# Patient Record
Sex: Female | Born: 1970
Health system: Southern US, Community
[De-identification: ages and names within clinical notes are randomized; demographics above are authoritative.]

## PROBLEM LIST (undated history)

## (undated) DIAGNOSIS — F988 Other specified behavioral and emotional disorders with onset usually occurring in childhood and adolescence: Secondary | ICD-10-CM

## (undated) DIAGNOSIS — E049 Nontoxic goiter, unspecified: Secondary | ICD-10-CM

## (undated) DIAGNOSIS — K649 Unspecified hemorrhoids: Secondary | ICD-10-CM

## (undated) DIAGNOSIS — R12 Heartburn: Secondary | ICD-10-CM

## (undated) DIAGNOSIS — M549 Dorsalgia, unspecified: Secondary | ICD-10-CM

## (undated) DIAGNOSIS — N920 Excessive and frequent menstruation with regular cycle: Secondary | ICD-10-CM

## (undated) DIAGNOSIS — F419 Anxiety disorder, unspecified: Secondary | ICD-10-CM

## (undated) DIAGNOSIS — R5383 Other fatigue: Secondary | ICD-10-CM

## (undated) DIAGNOSIS — N926 Irregular menstruation, unspecified: Secondary | ICD-10-CM

## (undated) DIAGNOSIS — M722 Plantar fascial fibromatosis: Secondary | ICD-10-CM

## (undated) DIAGNOSIS — K219 Gastro-esophageal reflux disease without esophagitis: Secondary | ICD-10-CM

## (undated) DIAGNOSIS — R7303 Prediabetes: Secondary | ICD-10-CM

## (undated) DIAGNOSIS — N644 Mastodynia: Secondary | ICD-10-CM

## (undated) DIAGNOSIS — R030 Elevated blood-pressure reading, without diagnosis of hypertension: Secondary | ICD-10-CM

## (undated) DIAGNOSIS — J309 Allergic rhinitis, unspecified: Secondary | ICD-10-CM

## (undated) DIAGNOSIS — I10 Essential (primary) hypertension: Secondary | ICD-10-CM

## (undated) DIAGNOSIS — R102 Pelvic and perineal pain: Secondary | ICD-10-CM

## (undated) DIAGNOSIS — N6089 Other benign mammary dysplasias of unspecified breast: Secondary | ICD-10-CM

## (undated) DIAGNOSIS — K829 Disease of gallbladder, unspecified: Secondary | ICD-10-CM

## (undated) DIAGNOSIS — L918 Other hypertrophic disorders of the skin: Secondary | ICD-10-CM

## (undated) DIAGNOSIS — D219 Benign neoplasm of connective and other soft tissue, unspecified: Secondary | ICD-10-CM

## (undated) DIAGNOSIS — J329 Chronic sinusitis, unspecified: Secondary | ICD-10-CM

## (undated) DIAGNOSIS — M778 Other enthesopathies, not elsewhere classified: Secondary | ICD-10-CM

## (undated) DIAGNOSIS — N816 Rectocele: Secondary | ICD-10-CM

## (undated) DIAGNOSIS — M2550 Pain in unspecified joint: Secondary | ICD-10-CM

## (undated) DIAGNOSIS — E669 Obesity, unspecified: Secondary | ICD-10-CM

## (undated) DIAGNOSIS — H6690 Otitis media, unspecified, unspecified ear: Secondary | ICD-10-CM

## (undated) DIAGNOSIS — R51 Headache: Secondary | ICD-10-CM

## (undated) HISTORY — DX: Other benign mammary dysplasias of unspecified breast: N60.89

## (undated) HISTORY — DX: Other hypertrophic disorders of the skin: L91.8

## (undated) HISTORY — DX: Nontoxic goiter, unspecified: E04.9

## (undated) HISTORY — DX: Excessive and frequent menstruation with regular cycle: N92.0

## (undated) HISTORY — DX: Obesity, unspecified: E66.9

## (undated) HISTORY — DX: Other specified behavioral and emotional disorders with onset usually occurring in childhood and adolescence: F98.8

## (undated) HISTORY — DX: Gastro-esophageal reflux disease without esophagitis: K21.9

## (undated) HISTORY — PX: GUM SURGERY: SHX658

## (undated) HISTORY — DX: Dorsalgia, unspecified: M54.9

## (undated) HISTORY — DX: Unspecified hemorrhoids: K64.9

## (undated) HISTORY — DX: Elevated blood-pressure reading, without diagnosis of hypertension: R03.0

## (undated) HISTORY — DX: Pain in unspecified joint: M25.50

## (undated) HISTORY — DX: Mastodynia: N64.4

## (undated) HISTORY — DX: Pelvic and perineal pain: R10.2

## (undated) HISTORY — DX: Benign neoplasm of connective and other soft tissue, unspecified: D21.9

## (undated) HISTORY — DX: Disease of gallbladder, unspecified: K82.9

## (undated) HISTORY — DX: Other fatigue: R53.83

## (undated) HISTORY — DX: Chronic sinusitis, unspecified: J32.9

## (undated) HISTORY — DX: Rectocele: N81.6

## (undated) HISTORY — DX: Headache: R51

## (undated) HISTORY — DX: Heartburn: R12

## (undated) HISTORY — DX: Essential (primary) hypertension: I10

## (undated) HISTORY — DX: Otitis media, unspecified, unspecified ear: H66.90

## (undated) HISTORY — PX: VARICOSE VEIN SURGERY: SHX832

## (undated) HISTORY — DX: Allergic rhinitis, unspecified: J30.9

## (undated) HISTORY — DX: Irregular menstruation, unspecified: N92.6

## (undated) HISTORY — DX: Plantar fascial fibromatosis: M72.2

## (undated) HISTORY — DX: Other enthesopathies, not elsewhere classified: M77.8

## (undated) HISTORY — DX: Anxiety disorder, unspecified: F41.9

## (undated) HISTORY — DX: Prediabetes: R73.03

## (undated) HISTORY — PX: OTHER SURGICAL HISTORY: SHX169

---

## 2001-10-23 ENCOUNTER — Ambulatory Visit (HOSPITAL_COMMUNITY): Admission: AD | Admit: 2001-10-23 | Discharge: 2001-10-23 | Payer: Self-pay | Admitting: Obstetrics and Gynecology

## 2001-11-27 ENCOUNTER — Inpatient Hospital Stay (HOSPITAL_COMMUNITY): Admission: RE | Admit: 2001-11-27 | Discharge: 2001-11-29 | Payer: Self-pay | Admitting: Obstetrics and Gynecology

## 2002-04-24 ENCOUNTER — Encounter (INDEPENDENT_AMBULATORY_CARE_PROVIDER_SITE_OTHER): Payer: Self-pay | Admitting: Specialist

## 2002-04-24 ENCOUNTER — Ambulatory Visit (HOSPITAL_COMMUNITY): Admission: RE | Admit: 2002-04-24 | Discharge: 2002-04-24 | Payer: Self-pay | Admitting: Obstetrics & Gynecology

## 2002-09-12 ENCOUNTER — Ambulatory Visit (HOSPITAL_COMMUNITY): Admission: RE | Admit: 2002-09-12 | Discharge: 2002-09-12 | Payer: Self-pay | Admitting: Obstetrics and Gynecology

## 2002-09-12 ENCOUNTER — Encounter: Payer: Self-pay | Admitting: Obstetrics and Gynecology

## 2003-06-06 ENCOUNTER — Ambulatory Visit (HOSPITAL_COMMUNITY): Admission: RE | Admit: 2003-06-06 | Discharge: 2003-06-06 | Payer: Self-pay | Admitting: Internal Medicine

## 2003-08-01 ENCOUNTER — Ambulatory Visit (HOSPITAL_COMMUNITY): Admission: RE | Admit: 2003-08-01 | Discharge: 2003-08-01 | Payer: Self-pay | Admitting: Family Medicine

## 2003-08-01 ENCOUNTER — Emergency Department (HOSPITAL_COMMUNITY): Admission: EM | Admit: 2003-08-01 | Discharge: 2003-08-01 | Payer: Self-pay | Admitting: Emergency Medicine

## 2004-05-21 ENCOUNTER — Ambulatory Visit (HOSPITAL_COMMUNITY): Admission: RE | Admit: 2004-05-21 | Discharge: 2004-05-21 | Payer: Self-pay | Admitting: Family Medicine

## 2004-11-06 ENCOUNTER — Encounter: Admission: RE | Admit: 2004-11-06 | Discharge: 2004-11-06 | Payer: Self-pay | Admitting: Obstetrics and Gynecology

## 2007-11-07 ENCOUNTER — Other Ambulatory Visit: Admission: RE | Admit: 2007-11-07 | Discharge: 2007-11-07 | Payer: Self-pay | Admitting: Obstetrics & Gynecology

## 2008-05-30 ENCOUNTER — Inpatient Hospital Stay (HOSPITAL_COMMUNITY): Admission: AD | Admit: 2008-05-30 | Discharge: 2008-06-01 | Payer: Self-pay | Admitting: Obstetrics and Gynecology

## 2008-11-04 ENCOUNTER — Other Ambulatory Visit: Admission: RE | Admit: 2008-11-04 | Discharge: 2008-11-04 | Payer: Self-pay | Admitting: Obstetrics and Gynecology

## 2008-12-02 ENCOUNTER — Encounter (HOSPITAL_COMMUNITY): Admission: RE | Admit: 2008-12-02 | Discharge: 2008-12-17 | Payer: Self-pay | Admitting: Unknown Physician Specialty

## 2009-11-20 ENCOUNTER — Other Ambulatory Visit: Admission: RE | Admit: 2009-11-20 | Discharge: 2009-11-20 | Payer: Self-pay | Admitting: Obstetrics and Gynecology

## 2009-12-29 ENCOUNTER — Ambulatory Visit (HOSPITAL_COMMUNITY): Admission: RE | Admit: 2009-12-29 | Discharge: 2009-12-29 | Payer: Self-pay | Admitting: Family Medicine

## 2010-09-14 ENCOUNTER — Emergency Department (HOSPITAL_COMMUNITY)
Admission: EM | Admit: 2010-09-14 | Discharge: 2010-09-15 | Payer: Self-pay | Source: Home / Self Care | Admitting: Emergency Medicine

## 2010-09-20 ENCOUNTER — Encounter: Payer: Self-pay | Admitting: Family Medicine

## 2010-09-24 ENCOUNTER — Ambulatory Visit (HOSPITAL_COMMUNITY)
Admission: RE | Admit: 2010-09-24 | Discharge: 2010-09-24 | Payer: Self-pay | Source: Home / Self Care | Attending: Family Medicine | Admitting: Family Medicine

## 2010-12-01 ENCOUNTER — Other Ambulatory Visit: Payer: Self-pay | Admitting: Obstetrics & Gynecology

## 2010-12-01 ENCOUNTER — Other Ambulatory Visit (HOSPITAL_COMMUNITY)
Admission: RE | Admit: 2010-12-01 | Discharge: 2010-12-01 | Disposition: A | Discharge status: Home / Self Care | Payer: BC Managed Care – PPO | Source: Ambulatory Visit | Attending: Obstetrics & Gynecology | Admitting: Obstetrics & Gynecology

## 2010-12-01 DIAGNOSIS — Z01419 Encounter for gynecological examination (general) (routine) without abnormal findings: Secondary | ICD-10-CM | POA: Insufficient documentation

## 2011-01-12 NOTE — Discharge Summary (Signed)
Kelsey Cooke, Kelsey Cooke             ACCOUNT NO.:  [id number]   MEDICAL RECORD NO.:  [id number]          PATIENT TYPE:  INP   LOCATION:  9110                          FACILITY:  [facility]   PHYSICIAN:  Lesly Dukes, M.D. DATE OF BIRTH:  Jul 23, 1971   DATE OF ADMISSION:  05/30/2008  DATE OF DISCHARGE:  06/01/2008                               DISCHARGE SUMMARY   DIAGNOSIS AT ADMISSION:  Onset of labor.   DIAGNOSIS AT DISCHARGE:  Status post normal spontaneous vaginal delivery  at term.   PROCEDURES DONE:  Nonstress tests.   BRIEF HOSPITAL COURSE:  This is a 40 year old G3, P3-0-0-3, female at 79  and 3 weeks who went into labor, had assisted rupture of membranes, and  delivered by normal spontaneous vaginal delivery with epidural.  There  was a 3-vessel cord and spontaneous placenta.  Estimated blood loss was  350 mL.  There was a second-degree laceration repaired with 2-0 Vicryl.  Apgars of the baby were 8 at one minute and 9 at five minutes.  It was a  female infant.  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  Contraception will be with the father getting a vasectomy.  The patient was group B strep negative.  Blood type A negative, rubella  immune, RPR nonreactive, and HIV negative.  Latest hemoglobin was 10.1.   DISCHARGE CONDITION:  Stable.   DISCHARGE DISPOSITION:  Home.   ACTIVITIES:  Unrestricted.   DIET:  Routine.   INSTRUCTIONS:  The patient is instructed to call Dr. Rayna Sexton office  for a followup appointment in 6 weeks.  She is also instructed to call  the office on Monday to set up a time to take her son in for  circumcision.      Rodney Langton, MD      Lesly Dukes, M.D.  Electronically Signed    TT/MEDQ  D:  06/01/2008  T:  06/01/2008  Job:  [id number]

## 2011-01-12 NOTE — Op Note (Signed)
NAMETYMIA, STREB             ACCOUNT NO.:  [id number]   MEDICAL RECORD NO.:  [id number]          PATIENT TYPE:  INP   LOCATION:  9110                          FACILITY:  [facility]   PHYSICIAN:  Tilda Burrow, M.D. DATE OF BIRTH:  [birth year]   DATE OF PROCEDURE:  DATE OF DISCHARGE:                               OPERATIVE REPORT   Delivery time 6:33 a.m. on May 30, 2008.   Kelsey Cooke was admitted shortly after 2:00 a.m. after presenting with  early labor, cervix was 3 cm on initial assessment progressing promptly  to 6 cm at 4 a.m.  Amniotomy was performed at 4:50 a.m. and low-dose  oxytocin was initiated.  She progressed, completely dilating within 2  hours and delivered from occiput anterior position a healthy female  infant.  Weight 7 pounds 4 ounces, delivered over a second degree  laceration in the left occiput anterior position with slight shoulder  dystocia, responding nicely to rotation counterclockwise and releasing  the anterior shoulder.  The cord was clamped, and baby placed on the  maternal abdomen.  Amniotic fluid was clear without malodor.  Placenta  delivered intact, Schultz presentation, cord blood samples obtained, but  no blood gas is considered necessary.  The patient recovered nicely the  second degree laceration was repaired easily using continuous running 0  Vicryl.  The patient is group B strep negative, will receive  circumcision inhouse, and husband plans vasectomy.      Tilda Burrow, M.D.  Electronically Signed     JVF/MEDQ  D:  05/30/2008  T:  05/31/2008  Job:  [id number]   cc:   Donna Bernard, M.D.  Fax: [phone]

## 2011-01-15 NOTE — Op Note (Signed)
[facility]  Patient:    Kelsey Cooke, Kelsey Cooke Visit Number: [id number] MRN: [id number]          Service Type: OBS Location: 4A A415 01 Attending Physician:  Tilda Burrow Dictated by:   Zerita Boers, N.M. Proc. Date: 11/27/01 Admit Date:  11/27/2001   CC:         [facility]   Operative Report  DELIVERY SUMMARY  Date of delivery:  November 27, 2001, at 10:27 p.m. Length of first stage labor:   14 hours 22 minutes. Length of second stage labor:  12 minutes. Length of third stage labor:   18 minutes.  DELIVERY NOTE:  Daaiyah had a spontaneous vaginal delivery of a viable female infant weighing 6 pounds 11 ounces, with Apgars of 9 and 9, with a second degree perineal laceration upon inspection.  Upon delivery of head, shoulders were delivered without any difficulty.  Upon delivery of infant, vigorous suctioning.  Infant had good tone, strong cry, and pinked up well.  Cord was clamped and infant laid upon mothers abdomen.  Second stage of labor was actively managed with 20 units Pitocin in 1000 cc of D5LR at a rapid rate. Placenta was delivered spontaneously via Schultze mechanism.  Three-vessel cord was noted upon inspection.  Fundus was firm _____ with massage, and estimated blood loss was approximately 350 cc.  Second degree perineal laceration was repaired using 20 cc of 1% lidocaine as local infiltrate, repair with 2-0 Vicryl, three interrupted sutures, and then subcuticular closed.  Good hemostasis was obtained, and infant and mother were transferred out to the postpartum unit in stable condition. Dictated by:   Zerita Boers, N.M. Attending Physician:  Tilda Burrow DD:  11/27/01 TD:  11/28/01 Job: [id number]

## 2011-01-15 NOTE — H&P (Signed)
NAME:  Kelsey Cooke, Kelsey Cooke NO.:  [id number]   MEDICAL RECORD NO.:  [id number]                  PATIENT TYPE:   LOCATION:                                       FACILITY:   PHYSICIAN:  R. Roetta Sessions, M.D.              DATE OF BIRTH:  December 19, 1970   DATE OF ADMISSION:  DATE OF DISCHARGE:                                HISTORY & PHYSICAL   REFERRING PHYSICIAN:  Dr. Bonne Dolores, family nurse  practitioner.   REASON FOR REFERRAL:  Left-sided abdominal pain and reflux disease.   HISTORY OF PRESENT ILLNESS:  [name] is a pleasant 40 year old  lady kindly sent over at the courtesy of Cyril Mourning, F.N.P. and Dr.  Turner Daniels to further evaluate crescendo reflux symptoms.  She describes  heartburn almost every night for the last few months.  She has gained 8  pounds in the past few months.  She was started on Nexium one month ago but  cannot tell much difference in her symptoms.  No odynophagia, no dysphagia;  no early satiety, nausea, or vomiting.  For the past eight years she has had  intermittent left lower quadrant abdominal pain.  Bowel function has been  altered somewhat.  She has anywhere from one bowel movement daily to one  every couple of days.  She has not seen any melena or rectal bleeding.  She  never has diarrhea.  She has not had any diagnostic studies per se.  She  does not use nonsteroidal agents.  She does not smoke or consume alcohol.  Left-sided abdominal pain is somewhat worse after she eats but she has been  awakened in the early morning hours with left lower quadrant abdominal pain  which is typically lessened by having a bowel movement.  There is no family  history of IBD or colorectal or other GI neoplasia.   PAST MEDICAL HISTORY:  Unremarkable for chronic illnesses.   PAST SURGERY:  She has had a skin tag removed from her right leg and  adhesions removed from her vagina.   CURRENT MEDICATIONS:  1.  Sarafem one daily.  2. Nexium 40 mg orally daily.  3. NuvaRing in place.   ALLERGIES:  No known drug allergies.   FAMILY HISTORY:  Mother and father alive, good health and two sisters and  two brother alive and well.  No history of chronic GI or liver illness.   SOCIAL HISTORY:  The patient is divorced.  She has two children.  She is  unemployed.  No tobacco, no alcohol.   REVIEW OF SYSTEMS:  As in history of present illness.  No fever or chills.  She has gained weight as outlined above.   PHYSICAL EXAMINATION:  GENERAL:  Reveals a pleasant 40 year old lady resting  comfortably.  VITAL SIGNS:  Weight 152.5, height 5 feet 4 inches.  Temperature 98.8, blood  pressure 100/70, pulse 66.  SKIN:  Warm and dry,  no jaundice.  No cutaneous stigmata of chronic liver  disease.  HEENT:  No scleral icterus.  JVD is not prominent.  Oral cavity with no  lesions.  CHEST:  Lungs are clear to auscultation.  CARDIAC:  Regular rate and rhythm without murmur, gallop, or rub.  BREAST:  Exam is deferred.  ABDOMEN:  Nondistended, positive bowel sounds, soft.  She has minimal left  lower quadrant tenderness to palpation.  No appreciable mass or  organomegaly.  EXTREMITIES:  No edema.  RECTAL:  Good sphincter tone, no mass in the rectal vault, no stool, mucous  hemoccult negative.   IMPRESSION:  [name] is a pleasant 40 year old lady with a  history of crescendo gastroesophageal reflux disease symptoms somewhat  refractory to proton pump inhibitor therapy in the way of Nexium.  She does  not have any alarm symptoms per se but her symptoms have been almost daily  for some time now.  She also has intermittent left lower quadrant abdominal  pain and altered bowel habits most consistent with irritable bowel syndrome.   RECOMMENDATIONS:  EGD in the near future to further evaluate her reflux  symptoms.  I discussed potential risks, benefits, and alternatives with Ms.  Cooke and will plan  to perform this procedure in the near future at [facility].  Will get her on a fiber supplement in the way of Benefiber  one tablespoon daily.  Will also give her some NuLev tablets one on the  tongue before meals and at bedtime p.r.n. abdominal cramps.  Will make  further recommendations in the near future.   I would like to thank Ms. Cyril Mourning, F.N.P. and Dr. Turner Daniels for  allowing me to see this nice lady today.                                               Jonathon Bellows, M.D.    RMR/MEDQ  D:  05/15/2003  T:  05/15/2003  Job:  [id number]   cc:   Lazaro Arms, M.D.  [address]  [place]  [place] [address]  Fax: [phone]

## 2011-01-15 NOTE — H&P (Signed)
[facility]  Patient:    Kelsey Cooke, Kelsey Cooke Visit Number: [id number] MRN: [id number]          Service Type: OBS Location: 4A A415 01 Attending Physician:  Tilda Burrow Dictated by:   Zerita Boers, N.M. Admit Date:  11/27/2001   CC:         [facility]   History and Physical  ADMITTING DIAGNOSIS:  Pregnancy at 38 weeks and 2 days with early labor and spontaneous rupture of membranes.  HISTORY OF PRESENT ILLNESS:  Charlann was seen early in the office this morning with prodromal on-and-off labor and after careful discussion and evaluation, she wanted to go home and let her labor become stronger before going to the hospital.  She was admitted into the hospital at approximately 2 p.m. with contractions; shortly after that, she had spontaneous rupture of membranes of clear fluid.  MEDICATIONS:  She is on prenatal vitamins.  MEDICAL HISTORY:  Positive for condylomata.  SURGICAL HISTORY:  Negative.  ALLERGIES:  She has no known allergies.  PRENATAL COURSE:  She has had recurrent vulvar condylomata that have been treated multiple times with TCA.  Blood type is A-negative.  She had a RhoGAM injection on November 13, 2001.  Rubella is immune.  Hepatitis B surface antigen is negative.  HIV is negative.  Pap is within normal limits.  GC and Chlamydia are both negative.  MSAFP was normal.  GBS was negative.  Twenty-eight-week hemoglobin 11.2, 29-week hematocrit 35.  One-hour glucose was elevated, so her three-hour was within normal limits.  PHYSICAL EXAMINATION:  VITAL SIGNS:  Weight is 178.  Blood pressure is 125/80.  PELVIC:  Cervix is 2 cm, completely effaced and about a 0 station.  ABDOMEN:  Fetal heart rate is 140, strong and regular, and fundal height is 34 cm.    PLAN:  We are going to admit and expect vaginal delivery and probable Pitocin augmentation of labor. Dictated by:   Zerita Boers, N.M. Attending Physician:   Tilda Burrow DD:  11/27/01 TD:  11/28/01 Job: [id number]

## 2011-01-15 NOTE — H&P (Signed)
   NAME:  Kelsey Cooke, Kelsey Cooke                     ACCOUNT NO.:  [id number]   MEDICAL RECORD NO.:  [id number]                   PATIENT TYPE:  OIB   LOCATION:  2899                                 FACILITY:  [facility]   PHYSICIAN:  Lazaro Arms, M.D.                DATE OF BIRTH:  Jun 06, 1971   DATE OF PROCEDURE:  DATE OF DISCHARGE:                      STAT - MUST CHANGE TO CORRECT WORK TYPE   HISTORY OF PRESENT ILLNESS:  Kelsey Cooke is a 39 year old white female who is  admitted for excision of condyloma acuminata and excision of right leg mass.   PAST MEDICAL HISTORY:  Condylomata.   PAST SURGICAL HISTORY:  Negative.   ALLERGIES:  None.   MEDICATIONS:  None.   REVIEW OF SYSTEMS:  Negative.   PHYSICAL EXAMINATION:  HEENT: Unremarkable.  NECK: Thyroid is normal.  LUNGS: Clear.  HEART: Regular rate and rhythm.  No murmur, rub, or gallop.  BREAST: Without masses or skin changes.  GU: She has condylomata of the external genitalia in the clitoral area,  labia majors, fourchet and perianal region.  EXTREMITIES: She has also has a lesion on the right leg, about 3x3 cm.   IMPRESSION:  1. Condylomata acuminata.  2. Right leg lipoma.   PLAN:  The patient is admitted for excision of condyloma and sharp excision  of the right leg mass.                                               Lazaro Arms, M.D.    [name]  D:  04/24/2002  T:  04/24/2002  Job:  [id number]

## 2011-01-15 NOTE — Op Note (Signed)
   NAME:  Kelsey Cooke, Kelsey Cooke                     ACCOUNT NO.:  [id number]   MEDICAL RECORD NO.:  [id number]                   PATIENT TYPE:  OIB   LOCATION:  2899                                 FACILITY:  [facility]   PHYSICIAN:  Lazaro Arms, M.D.                DATE OF BIRTH:  [birth year]   DATE OF PROCEDURE:  04/24/2002  DATE OF DISCHARGE:                                 OPERATIVE REPORT   PREOPERATIVE DIAGNOSES:  1. Condyloma acuminata.  2. Right leg mass.   PROCEDURES:  1. Argon beam coagulation of condyloma acuminata.  2. Excision of right leg lipoma.  3. Placement of pudendal block.   FINDINGS:  The patient had condyloma acuminata of the clitoris, labia  majora, fourchette, and perianal region.  She also had about a 2 x 2 cm mass  of the right leg that was felt to be a lipoma.   DESCRIPTION OF PROCEDURE:  The patient was taken to the operating room and  placed in the supine position, underwent laryngeal mask airway general  anesthesia.  Prepped and draped in the usual sterile fashion.  A 15 blade  was used and an elliptical incision made to excise the right leg mass.  Marcaine 0.5% with 1:200,000 epinephrine was injected pre-incision for  hemostasis.  Coagulation was used for additional hemostasis.  Three  interrupted subcu sutures were placed, and then a subcuticular stitch was  placed and Dermabond placed over the top of that.  Then attention was turned  to the perineum.  The argon beam coagulator was placed on a power setting of  60 with continuous air flow, and all the condyloma acuminata seen in the  areas were ablated.  The patient tolerated the procedure well.  She was  awakened from anesthesia and taken to the recovery room in good and stable  condition.  All counts were correct.                                                 Lazaro Arms, M.D.    [name]  D:  04/24/2002  T:  04/26/2002  Job:  [id number]

## 2011-01-15 NOTE — Op Note (Signed)
NAME:  Kelsey Cooke, Kelsey Cooke                     ACCOUNT NO.:  [id number]   MEDICAL RECORD NO.:  [id number]                   PATIENT TYPE:  AMB   LOCATION:  DAY                                  FACILITY:  [facility]   PHYSICIAN:  R. Roetta Sessions, M.D.              DATE OF BIRTH:  Apr 30, 1971   DATE OF PROCEDURE:  06/06/2003  DATE OF DISCHARGE:                                 OPERATIVE REPORT   PROCEDURE:  Diagnostic esophagogastroduodenoscopy.   ENDOSCOPIST:  Gerrit Friends. Rourk, M.D.   INDICATIONS FOR PROCEDURE:  The patient is a 40 year old lady with  longstanding reflux symptoms somewhat refractory to b.i.d. Nexium.  He has  also had left lower quadrant abdominal pain and constipation.  She has not  yet started any fiber supplementation.  She has tried NuLev, but that has  not helped.  EGD is now being done to further evaluate her longstanding  reflux symptoms.  This approach has been discussed with Kelsey Cooke.  The  potential risks, benefits, and alternatives have been reviewed; questions  answered.  Please see my dictated H&P May 15, 2003 for more  information.   PROCEDURE NOTE:  O2 saturation, blood pressure, pulse and respirations were  monitored throughout the entire procedure.  Conscious sedation: Versed 3 mg  IV, Demerol 75 mg IV in divided doses, Cetacaine spray for topical  oropharyngeal anesthesia.   INSTRUMENT:  Olympus video chip gastroscope.   FINDINGS:  Examination of the tubular esophagus revealed no mucosal  abnormalities.  The EG junction was easily traversed.   STOMACH:  The gastric cavity was empty.  It insufflated well with air.  A  thorough examination of the gastric mucosa including a retroflex view of the  proximal stomach and esophagogastric junction demonstrated no abnormalities.  The pylorus was patent and easily traversed.   DUODENUM:  The bulb and the second portion appeared normal.   THERAPEUTIC/DIAGNOSTIC MANEUVERS:  None.   The patient  tolerated the procedure well and was reacted in endoscopy.   IMPRESSION:  1. Normal esophagus.  2. Normal stomach.  3. Normal D1 and D2.   RECOMMENDATIONS:  1. Stop Nexium try Prevacid 30 mg orally daily before breakfast.  She is to     stop by my office to get #30 free samples.  2. Begin fiber supplementation as previously recommended.  3. Crystallose laxative 20 gm orally at bedtime p.r.n. constipation,     prescription given.  4. Follow up appointment with [place] in 1 month's time.      ___________________________________________                                            Jonathon Bellows, M.D.   RMR/MEDQ  D:  06/06/2003  T:  06/06/2003  Job:  [id number]   cc:   Cyndi Lennert.  Emelda Fear, M.D.  [address] [place]  [place] [address]  Fax: [phone]   Lazaro Arms, M.D.  [address]  [place]  [place] [address]  Fax: [phone]

## 2011-06-01 LAB — CBC
HCT: 31 — ABNORMAL LOW
HCT: 35.5 — ABNORMAL LOW
Hemoglobin: 10.1 — ABNORMAL LOW
Hemoglobin: 11.7 — ABNORMAL LOW
MCHC: 32.6
MCHC: 32.9
MCV: 77 — ABNORMAL LOW
MCV: 77.8 — ABNORMAL LOW
Platelets: 283
Platelets: 345
RBC: 3.99
RBC: 4.61
RDW: 15.4
RDW: 15.6 — ABNORMAL HIGH
WBC: 10.1
WBC: 11.1 — ABNORMAL HIGH

## 2011-06-01 LAB — RH IMMUNE GLOB WKUP(>/=20WKS)(NOT WOMEN'S HOSP): Fetal Screen: NEGATIVE

## 2011-06-01 LAB — RPR: RPR Ser Ql: NONREACTIVE

## 2011-10-16 ENCOUNTER — Emergency Department (HOSPITAL_COMMUNITY)
Admission: EM | Admit: 2011-10-16 | Discharge: 2011-10-16 | Disposition: A | Discharge status: Home / Self Care | Payer: BC Managed Care – PPO | Attending: Emergency Medicine | Admitting: Emergency Medicine

## 2011-10-16 ENCOUNTER — Emergency Department (HOSPITAL_COMMUNITY): Payer: BC Managed Care – PPO

## 2011-10-16 ENCOUNTER — Encounter (HOSPITAL_COMMUNITY): Payer: Self-pay | Admitting: *Deleted

## 2011-10-16 DIAGNOSIS — W010XXA Fall on same level from slipping, tripping and stumbling without subsequent striking against object, initial encounter: Secondary | ICD-10-CM | POA: Insufficient documentation

## 2011-10-16 DIAGNOSIS — S93609A Unspecified sprain of unspecified foot, initial encounter: Secondary | ICD-10-CM | POA: Insufficient documentation

## 2011-10-16 MED ORDER — IBUPROFEN 800 MG PO TABS
800.0000 mg | ORAL_TABLET | Freq: Once | ORAL | Status: AC
  Administered 2011-10-16: 800 mg via ORAL
  Filled 2011-10-16: qty 1

## 2011-10-16 MED ORDER — OXYCODONE-ACETAMINOPHEN 5-325 MG PO TABS
1.0000 | ORAL_TABLET | Freq: Once | ORAL | Status: AC
  Administered 2011-10-16: 1 via ORAL
  Filled 2011-10-16: qty 1

## 2011-10-16 MED ORDER — OXYCODONE-ACETAMINOPHEN 5-325 MG PO TABS: 1.0000 | ORAL_TABLET | ORAL | Status: AC | PRN

## 2011-10-16 NOTE — ED Provider Notes (Signed)
History     CSN: [id number]  Arrival date & time 10/16/11  0547   First MD Initiated Contact with Patient 10/16/11 [phone]      Chief Complaint  Patient presents with  . Ankle Pain     Patient is a 41 y.o. female presenting with ankle pain. The history is provided by the patient.  Ankle Pain  Incident onset: last night. The injury mechanism was a fall. The pain is present in the left foot. The pain is moderate. The pain has been constant since onset. Associated symptoms include inability to bear weight. The symptoms are aggravated by bearing weight and palpation.  Pt was wearing "high heels" last night and fell while walking on grass She did not have immediate pain and nothing else was injured She woke up this morning with intense pain in left foot   PMH - none  History reviewed. No pertinent past surgical history.  History reviewed. No pertinent family history.  History  Substance Use Topics  . Smoking status: Never Smoker   . Smokeless tobacco: Not on file  . Alcohol Use: No    OB History    Grav Para Term Preterm Abortions TAB SAB Ect Mult Living                  Review of Systems  Cardiovascular: Negative for chest pain.  Musculoskeletal: Negative for back pain.    Allergies  Review of patient's allergies indicates no known allergies.  Home Medications  No current outpatient prescriptions on file.  BP 143/87  Pulse 87  Temp 98 F (36.7 C)  Resp 20  SpO2 99%  LMP 10/02/2011  Physical Exam CONSTITUTIONAL: Well developed/well nourished HEAD AND FACE: Normocephalic/atraumatic EYES: EOMI/PERRL ENMT: Mucous membranes moist NECK: supple no meningeal signs SPINE:entire spine nontender CV: S1/S2 noted, no murmurs/rubs/gallops noted LUNGS: Lungs are clear to auscultation bilaterally, no apparent distress ABDOMEN: soft, nontender, no rebound or guarding GU:no cva tenderness NEURO: Pt is awake/alert, moves all extremitiesx4 EXTREMITIES: pulses normal, full  ROM.  Tenderness to dorsal aspect of left foot and tenderness to palpation of plantar surface of left foot.  No bruising/edema noted.  Distal cap refill brisk on left foot.  No tenderness to either malleoli, left achilles intact and no prox fib tenderness noted. SKIN: warm, color normal PSYCH: no abnormalities of mood noted  ED Course  Procedures    Will start on crutches/NWB and f/u with ortho if no improvement in one week  MDM  Nursing notes reviewed and considered in documentation xrays reviewed and considered         Joya Gaskins, MD 10/16/11 [phone]

## 2011-10-16 NOTE — Discharge Instructions (Signed)
Foot Sprain °The muscles and cord like structures which attach muscle to bone (tendons) that surround the feet are made up of units. A foot sprain can occur at the weakest spot in any of these units. This condition is most often caused by injury to or overuse of the foot, as from playing contact sports, or aggravating a previous injury, or from poor conditioning, or obesity. °SYMPTOMS °· Pain with movement of the foot.  °· Tenderness and swelling at the injury site.  °· Loss of strength is present in moderate or severe sprains.  °THE THREE GRADES OR SEVERITY OF FOOT SPRAIN ARE: °· Mild (Grade I): Slightly pulled muscle without tearing of muscle or tendon fibers or loss of strength.  °· Moderate (Grade II): Tearing of fibers in a muscle, tendon, or at the attachment to bone, with small decrease in strength.  °· Severe (Grade III): Rupture of the muscle-tendon-bone attachment, with separation of fibers. Severe sprain requires surgical repair. Often repeating (chronic) sprains are caused by overuse. Sudden (acute) sprains are caused by direct injury or over-use.  °DIAGNOSIS  °Diagnosis of this condition is usually by your own observation. If problems continue, a caregiver may be required for further evaluation and treatment. X-rays may be required to make sure there are not breaks in the bones (fractures) present. Continued problems may require physical therapy for treatment. °PREVENTION °· Use strength and conditioning exercises appropriate for your sport.  °· Warm up properly prior to working out.  °· Use athletic shoes that are made for the sport you are participating in.  °· Allow adequate time for healing. Early return to activities makes repeat injury more likely, and can lead to an unstable arthritic foot that can result in prolonged disability. Mild sprains generally heal in 3 to 10 days, with moderate and severe sprains taking 2 to 10 weeks. Your caregiver can help you determine the proper time required for  healing.  °HOME CARE INSTRUCTIONS  °· Apply ice to the injury for 15 to 20 minutes, 3 to 4 times per day. Put the ice in a plastic bag and place a towel between the bag of ice and your skin.  °· An elastic wrap (like an Ace bandage) may be used to keep swelling down.  °· Keep foot above the level of the heart, or at least raised on a footstool, when swelling and pain are present.  °· Try to avoid use other than gentle range of motion while the foot is painful. Do not resume use until instructed by your caregiver. Then begin use gradually, not increasing use to the point of pain. If pain does develop, decrease use and continue the above measures, gradually increasing activities that do not cause discomfort, until you gradually achieve normal use.  °· Use crutches if and as instructed, and for the length of time instructed.  °· Keep injured foot and ankle wrapped between treatments.  °· Massage foot and ankle for comfort and to keep swelling down. Massage from the toes up towards the knee.  °· Only take over-the-counter or prescription medicines for pain, discomfort, or fever as directed by your caregiver.  °SEEK IMMEDIATE MEDICAL CARE IF:  °· Your pain and swelling increase, or pain is not controlled with medications.  °· You have loss of feeling in your foot or your foot turns cold or blue.  °· You develop new, unexplained symptoms, or an increase of the symptoms that brought you to your caregiver.  °MAKE SURE YOU:  °·   Understand these instructions.  °· Will watch your condition.  °· Will get help right away if you are not doing well or get worse.  °Document Released: 02/05/2002 Document Revised: 04/28/2011 Document Reviewed: 04/04/2008 °[employer]® Patient Information ©2012 [employer]. °

## 2011-10-16 NOTE — ED Notes (Signed)
Pt slipped outside and now c/o left ankle pain

## 2011-12-02 ENCOUNTER — Inpatient Hospital Stay (HOSPITAL_COMMUNITY): Admit: 2011-12-02 | Payer: Self-pay

## 2011-12-02 ENCOUNTER — Other Ambulatory Visit: Payer: Self-pay | Admitting: Adult Health

## 2011-12-02 ENCOUNTER — Other Ambulatory Visit (HOSPITAL_COMMUNITY)
Admission: RE | Admit: 2011-12-02 | Discharge: 2011-12-02 | Disposition: A | Discharge status: Home / Self Care | Payer: BC Managed Care – PPO | Source: Ambulatory Visit | Attending: Obstetrics and Gynecology | Admitting: Obstetrics and Gynecology

## 2011-12-02 DIAGNOSIS — Z139 Encounter for screening, unspecified: Secondary | ICD-10-CM

## 2011-12-02 DIAGNOSIS — Z1159 Encounter for screening for other viral diseases: Secondary | ICD-10-CM | POA: Insufficient documentation

## 2011-12-07 ENCOUNTER — Ambulatory Visit (HOSPITAL_COMMUNITY)
Admission: RE | Admit: 2011-12-07 | Discharge: 2011-12-07 | Disposition: A | Discharge status: Home / Self Care | Payer: BC Managed Care – PPO | Source: Ambulatory Visit | Attending: Adult Health | Admitting: Adult Health

## 2011-12-07 DIAGNOSIS — Z1231 Encounter for screening mammogram for malignant neoplasm of breast: Secondary | ICD-10-CM | POA: Insufficient documentation

## 2011-12-07 DIAGNOSIS — Z139 Encounter for screening, unspecified: Secondary | ICD-10-CM

## 2012-07-17 ENCOUNTER — Ambulatory Visit (HOSPITAL_COMMUNITY)
Admission: RE | Admit: 2012-07-17 | Discharge: 2012-07-17 | Disposition: A | Discharge status: Home / Self Care | Payer: BC Managed Care – PPO | Source: Ambulatory Visit | Attending: Family Medicine | Admitting: Family Medicine

## 2012-07-17 ENCOUNTER — Other Ambulatory Visit: Payer: Self-pay | Admitting: Family Medicine

## 2012-07-17 DIAGNOSIS — M545 Low back pain, unspecified: Secondary | ICD-10-CM | POA: Insufficient documentation

## 2012-08-11 ENCOUNTER — Other Ambulatory Visit: Payer: Self-pay | Admitting: Adult Health

## 2012-08-11 DIAGNOSIS — N6009 Solitary cyst of unspecified breast: Secondary | ICD-10-CM

## 2012-08-25 ENCOUNTER — Ambulatory Visit (HOSPITAL_COMMUNITY)
Admission: RE | Admit: 2012-08-25 | Discharge: 2012-08-25 | Disposition: A | Discharge status: Home / Self Care | Payer: BC Managed Care – PPO | Source: Ambulatory Visit | Attending: Adult Health | Admitting: Adult Health

## 2012-08-25 DIAGNOSIS — N6009 Solitary cyst of unspecified breast: Secondary | ICD-10-CM

## 2012-08-25 DIAGNOSIS — N63 Unspecified lump in unspecified breast: Secondary | ICD-10-CM | POA: Insufficient documentation

## 2012-12-27 ENCOUNTER — Encounter: Payer: Self-pay | Admitting: Nurse Practitioner

## 2012-12-27 ENCOUNTER — Ambulatory Visit (INDEPENDENT_AMBULATORY_CARE_PROVIDER_SITE_OTHER): Payer: BC Managed Care – PPO | Admitting: Nurse Practitioner

## 2012-12-27 VITALS — BP 118/82 | Temp 98.8°F | Wt 184.6 lb

## 2012-12-27 DIAGNOSIS — M62838 Other muscle spasm: Secondary | ICD-10-CM

## 2012-12-27 DIAGNOSIS — J069 Acute upper respiratory infection, unspecified: Secondary | ICD-10-CM

## 2012-12-27 MED ORDER — AMOXICILLIN-POT CLAVULANATE 875-125 MG PO TABS: 1.0000 | ORAL_TABLET | Freq: Two times a day (BID) | ORAL | Status: DC

## 2012-12-27 MED ORDER — METHOCARBAMOL 750 MG PO TABS: 750.0000 mg | ORAL_TABLET | Freq: Three times a day (TID) | ORAL | Status: DC

## 2012-12-27 NOTE — Progress Notes (Signed)
Subjective:  Presents for complaints of head congestion for the past 5 days. Is now having a cough. Mucus has a bad taste. No fever. Slightly swollen glands. Sore throat. Ear pain. Minimal headache. No wheezing. Also having upper back/neck pain with difficulty moving the neck. No specific history of injury. Began with a shoulder strain over a week ago which has improved, now the pain has spread across the upper back/neck area. Does not radiate down the arms. No numbness or weakness of the upper extremities.  Objective:   BP 118/82  Temp(Src) 98.8 F (37.1 C) (Oral)  Wt 184 lb 9.6 oz (83.734 kg) NAD. Alert, oriented. TMs significant clear effusion, no erythema. Posterior pharynx mildly erythematous with slightly green thick PND noted. Neck supple with minimal soft nontender adenopathy. Lungs clear. Heart regular rate rhythm. Extremely tight tender muscles noted along the cervical and lateral neck area and upper back area. Good ROM, tenderness especially with lateral movements. Upper extremity 5+ strength bilaterally.  Assessment:Acute upper respiratory infection  Muscle spasms of neck  Plan: Meds ordered this encounter  Medications  . methocarbamol (ROBAXIN) 750 MG tablet    Sig: Take 1 tablet (750 mg total) by mouth 3 (three) times daily. Prn muscle spasms    Dispense:  30 tablet    Refill:  0    Order Specific Question:  Supervising Provider    Answer:  Merlyn Albert [[id number]]  . amoxicillin-clavulanate (AUGMENTIN) 875-125 MG per tablet    Sig: Take 1 tablet by mouth 2 (two) times daily.    Dispense:  20 tablet    Refill:  0    Order Specific Question:  Supervising Provider    Answer:  Merlyn Albert [[id number]]   anti-inflammatories as directed. Ice/heat applications to neck area. Recommend massage therapy. Discussed importance of stress reduction. OTC meds as directed for congestion. Call back if worsens or persists.

## 2012-12-27 NOTE — Patient Instructions (Addendum)
Kelsey Cooke or Kelsey Cooke massage therapists; [employer] [phone]

## 2012-12-29 ENCOUNTER — Encounter: Payer: Self-pay | Admitting: *Deleted

## 2013-03-20 ENCOUNTER — Telehealth: Payer: Self-pay | Admitting: Adult Health

## 2013-03-20 NOTE — Telephone Encounter (Signed)
Pt states lump in breast feels larger. Pt asking does she need to get mammogram done or she Cyril Mourning, NP first. Per Victorino Dike pt needs to see her. Transferred call to front staff for an appt to be made with Victorino Dike.

## 2013-03-27 ENCOUNTER — Encounter: Payer: Self-pay | Admitting: Adult Health

## 2013-03-27 ENCOUNTER — Ambulatory Visit (INDEPENDENT_AMBULATORY_CARE_PROVIDER_SITE_OTHER): Payer: BC Managed Care – PPO | Admitting: Adult Health

## 2013-03-27 VITALS — BP 120/86 | Ht 65.0 in | Wt 186.0 lb

## 2013-03-27 DIAGNOSIS — L918 Other hypertrophic disorders of the skin: Secondary | ICD-10-CM | POA: Insufficient documentation

## 2013-03-27 DIAGNOSIS — N6082 Other benign mammary dysplasias of left breast: Secondary | ICD-10-CM

## 2013-03-27 DIAGNOSIS — L909 Atrophic disorder of skin, unspecified: Secondary | ICD-10-CM

## 2013-03-27 DIAGNOSIS — N6089 Other benign mammary dysplasias of unspecified breast: Secondary | ICD-10-CM | POA: Insufficient documentation

## 2013-03-27 HISTORY — DX: Other hypertrophic disorders of the skin: L91.8

## 2013-03-27 NOTE — Patient Instructions (Addendum)
Epidermal Cyst An epidermal cyst is sometimes called a sebaceous cyst, epidermal inclusion cyst, or infundibular cyst. These cysts usually contain a substance that looks "pasty" or "cheesy" and may have a bad smell. This substance is a protein called keratin. Epidermal cysts are usually found on the face, neck, or trunk. They may also occur in the vaginal area or other parts of the genitalia of both men and women. Epidermal cysts are usually small, painless, slow-growing bumps or lumps that move freely under the skin. It is important not to try to pop them. This may cause an infection and lead to tenderness and swelling. CAUSES  Epidermal cysts may be caused by a deep penetrating injury to the skin or a plugged hair follicle, often associated with acne. SYMPTOMS  Epidermal cysts can become inflamed and cause:  Redness.  Tenderness.  Increased temperature of the skin over the bumps or lumps.  Grayish-white, bad smelling material that drains from the bump or lump. DIAGNOSIS  Epidermal cysts are easily diagnosed by your caregiver during an exam. Rarely, a tissue sample (biopsy) may be taken to rule out other conditions that may resemble epidermal cysts. TREATMENT   Epidermal cysts often get better and disappear on their own. They are rarely ever cancerous.  If a cyst becomes infected, it may become inflamed and tender. This may require opening and draining the cyst. Treatment with antibiotics may be necessary. When the infection is gone, the cyst may be removed with minor surgery.  Small, inflamed cysts can often be treated with antibiotics or by injecting steroid medicines.  Sometimes, epidermal cysts become large and bothersome. If this happens, surgical removal in your caregiver's office may be necessary. HOME CARE INSTRUCTIONS  Only take over-the-counter or prescription medicines as directed by your caregiver.  Take your antibiotics as directed. Finish them even if you start to feel  better. SEEK MEDICAL CARE IF:   Your cyst becomes tender, red, or swollen.  Your condition is not improving or is getting worse.  You have any other questions or concerns. MAKE SURE YOU:  Understand these instructions.  Will watch your condition.  Will get help right away if you are not doing well or get worse. Document Released: 07/17/2004 Document Revised: 11/08/2011 Document Reviewed: 02/22/2011 [facility] Patient Information 2014 [place], [place]. Return in 2 weeks for removal of cyst

## 2013-03-27 NOTE — Progress Notes (Signed)
Subjective:     Patient ID: Kelsey Cooke, female   DOB: 08-20-1971, 42 y.o.   MRN: [id number]  HPI Kelsey Cooke is in today complaining of a lump in left breast that has been there for years but it is growing.And she has 2 skin tags that her bra rubs that she wants removed. She has some ?muscle pull in left upper chest.  Review of Systems Positives in HPI   Reviewed past medical,surgical, social and family history. Reviewed medications and allergies.  Objective:   Physical Exam BP 120/86  Ht 5\' 5"  (1.651 m)  Wt 186 lb (84.369 kg)  BMI 30.95 kg/m2  LMP 03/05/2013    Skin warm and dry,  Breasts:no dominate palpable mass, retraction or nipple discharge but on left breast at 8-9 o'clock there is a 3 x 2 cm oval mass 4.5 cm from areola that correlates to sebaceous cyst seen on [place] this year.And she has 2 minute skin tags that she wants removed, with verbal consent, I injected about .25 cc of 2% lidocaine under each tag, applied hemostats to base and the removed and clipped with scissors,no bleeding and band aids applied.  Assessment:      Sebaceous cyst left breast Skin tags x 2 with removal    Plan:      Review hand out on epidermal cyst Return in 2 weeks for Dr Kelsey Cooke to remove cyst Keep band aid on skin tag removal site, keep clean and dry

## 2013-04-12 ENCOUNTER — Ambulatory Visit: Payer: BC Managed Care – PPO | Admitting: Obstetrics and Gynecology

## 2013-04-25 ENCOUNTER — Telehealth: Payer: Self-pay | Admitting: Adult Health

## 2013-04-26 NOTE — Telephone Encounter (Signed)
Wants to stop period that is due in October going to [place], will discuss with MD and call back.

## 2013-04-26 NOTE — Telephone Encounter (Signed)
Pt going on vacation to [facility], started her period. Requesting medication to stop bleeding. Pt saw Cyril Mourning, NP on 03/27/13.

## 2013-04-27 MED ORDER — MEGESTROL ACETATE 40 MG PO TABS: 40.0000 mg | ORAL_TABLET | Freq: Every day | ORAL | Status: DC

## 2013-04-27 NOTE — Telephone Encounter (Signed)
Will try to stop menses with megace, discussed with Dr Despina Hidden

## 2013-04-27 NOTE — Addendum Note (Signed)
Addended by: Cyril Mourning A on: 04/27/2013 02:12 PM   Modules accepted: Orders

## 2013-08-07 ENCOUNTER — Encounter: Payer: Self-pay | Admitting: Family Medicine

## 2013-08-07 ENCOUNTER — Ambulatory Visit (INDEPENDENT_AMBULATORY_CARE_PROVIDER_SITE_OTHER): Payer: BC Managed Care – PPO | Admitting: Family Medicine

## 2013-08-07 VITALS — BP 128/88 | Temp 99.0°F | Ht 65.5 in | Wt 176.5 lb

## 2013-08-07 DIAGNOSIS — F32A Depression, unspecified: Secondary | ICD-10-CM | POA: Insufficient documentation

## 2013-08-07 DIAGNOSIS — F329 Major depressive disorder, single episode, unspecified: Secondary | ICD-10-CM

## 2013-08-07 DIAGNOSIS — R079 Chest pain, unspecified: Secondary | ICD-10-CM

## 2013-08-07 DIAGNOSIS — F3289 Other specified depressive episodes: Secondary | ICD-10-CM

## 2013-08-07 DIAGNOSIS — J209 Acute bronchitis, unspecified: Secondary | ICD-10-CM

## 2013-08-07 MED ORDER — ESCITALOPRAM OXALATE 10 MG PO TABS: 10.0000 mg | ORAL_TABLET | Freq: Every day | ORAL | Status: DC

## 2013-08-07 MED ORDER — AZITHROMYCIN 250 MG PO TABS: ORAL_TABLET | ORAL | Status: AC

## 2013-08-07 NOTE — Progress Notes (Signed)
   Subjective:    Patient ID: Kelsey Cooke, female    DOB: 1971/06/25, 42 y.o.   MRN: [id number]  URI  This is a new problem. The current episode started in the past 7 days. The problem has been unchanged. The maximum temperature recorded prior to her arrival was 100 - 100.9 F. Associated symptoms include congestion, coughing, headaches and a sore throat. Associated symptoms comments: Body aches. She has tried acetaminophen for the symptoms. The treatment provided mild relief.    Patient also having chest pressure. No associated nausea or diaphoresis or shortness of breath. Last for a while then fades. Generally not associated with exertion. Comes at times. Often associated with rapid heart rate. Admits to a tremendous amount of stress currently. In the midst of marital counseling. Having difficulties with her husband who travels much with his band.   Notes a lot of difficulty sleeping at night.  Feels down a lot cries at times. No suicidal or homicidal thoughts  Review of Systems  HENT: Positive for congestion and sore throat.   Respiratory: Positive for cough.   Neurological: Positive for headaches.   ROS otherwise negative     Objective:   Physical Exam  Alert vitals stable HEENT moderate nasal congestion heart regular rate and rhythm no tachyarrhythmia currently lungs clear to auscultation      Assessment & Plan:  Impression 1 insomnia discussed likely related to #2 #2 anxiety with element of depression. #3 chest pain associated with rapid heart rate often more of a pressure. Generally with stress. Doubt cardiac etiology discussed. #4 acute bronchitis plan Z-Pak. #2 initiate Lexapro 10 daily rationale discussed.

## 2013-10-17 ENCOUNTER — Other Ambulatory Visit: Payer: BC Managed Care – PPO | Admitting: Adult Health

## 2013-10-30 ENCOUNTER — Other Ambulatory Visit: Payer: BC Managed Care – PPO | Admitting: Adult Health

## 2013-11-08 ENCOUNTER — Encounter (INDEPENDENT_AMBULATORY_CARE_PROVIDER_SITE_OTHER): Payer: Self-pay

## 2013-11-08 ENCOUNTER — Other Ambulatory Visit (HOSPITAL_COMMUNITY)
Admission: RE | Admit: 2013-11-08 | Discharge: 2013-11-08 | Disposition: A | Discharge status: Home / Self Care | Payer: BC Managed Care – PPO | Source: Ambulatory Visit | Attending: Adult Health | Admitting: Adult Health

## 2013-11-08 ENCOUNTER — Ambulatory Visit (INDEPENDENT_AMBULATORY_CARE_PROVIDER_SITE_OTHER): Payer: BC Managed Care – PPO | Admitting: Adult Health

## 2013-11-08 ENCOUNTER — Encounter: Payer: Self-pay | Admitting: Adult Health

## 2013-11-08 VITALS — BP 120/82 | HR 74

## 2013-11-08 DIAGNOSIS — N920 Excessive and frequent menstruation with regular cycle: Secondary | ICD-10-CM

## 2013-11-08 DIAGNOSIS — J329 Chronic sinusitis, unspecified: Secondary | ICD-10-CM | POA: Insufficient documentation

## 2013-11-08 DIAGNOSIS — Z01419 Encounter for gynecological examination (general) (routine) without abnormal findings: Secondary | ICD-10-CM

## 2013-11-08 DIAGNOSIS — N6089 Other benign mammary dysplasias of unspecified breast: Secondary | ICD-10-CM

## 2013-11-08 DIAGNOSIS — Z1151 Encounter for screening for human papillomavirus (HPV): Secondary | ICD-10-CM | POA: Insufficient documentation

## 2013-11-08 DIAGNOSIS — Z1212 Encounter for screening for malignant neoplasm of rectum: Secondary | ICD-10-CM

## 2013-11-08 HISTORY — DX: Excessive and frequent menstruation with regular cycle: N92.0

## 2013-11-08 HISTORY — DX: Chronic sinusitis, unspecified: J32.9

## 2013-11-08 LAB — HEMOCCULT GUIAC POC 1CARD (OFFICE): Fecal Occult Blood, POC: NEGATIVE

## 2013-11-08 MED ORDER — AZITHROMYCIN 250 MG PO TABS: ORAL_TABLET | ORAL | Status: DC

## 2013-11-08 NOTE — Progress Notes (Signed)
Patient ID: Kelsey Cooke, female   DOB: Mar 20, 1971, 43 y.o.   MRN: [id number] History of Present Illness: Kelsey Cooke is a 43 year old white female, married in for a pap and physical and complains of sinus pressure and heavy periods.   Current Medications, Allergies, Past Medical History, Past Surgical History, Family History and Social History were reviewed in [employer] record.     Review of Systems: Patient denies any headaches,( has sinus pressure), blurred vision, shortness of breath, chest pain, abdominal pain, problems with bowel movements, urination, or intercourse. No joint pain, has been stressed and anxious (husband cheated) has lexapro but does not remember to take everyday.Has sinus pressure and heavy periods changes every 2 hours at times,    Physical Exam:BP 120/82  Pulse 74  LMP 10/18/2013 General:  Well developed, well nourished, no acute distress Skin:  Warm and dry, has tenderness over maxillary sinus Neck:  Midline trachea, normal thyroid, neck tender, no lymph nodes felt Lungs; Clear to auscultation bilaterally Breast:  No dominant palpable mass, retraction, or nipple discharge, has sebaceous cyst at 9 o'clock 3 FB from nipple about size of nickel, is stable and was seen on [place] in 2013. Cardiovascular: Regular rate and rhythm Abdomen:  Soft, non tender, no hepatosplenomegaly Pelvic:  External genitalia is normal in appearance, except has angiokeratomas of fordyce.  The vagina is normal in appearance.  The cervix is bulbous, pap with HPV performed and GC/CHL obtained.Marland Kitchen  Uterus is felt to be normal size, shape, and contour.  No   adnexal masses, has tenderness noted LLQ but also some on right. Rectal: Good sphincter tone, no polyps, or hemorrhoids felt.  Hemoccult negative. Extremities:  No swelling or varicosities noted Psych: alert and cooperative, seems happy but quiet   Impression: Yearly gyn exam Sinus infection Menorrhagia Sebaceous cyst  left breast    Plan: Physical in 1 year Mammogram now and yearly Check CBC,CMP,TSH and lipids and GC/CHL Return in 1 week for [place] Rx Z pak, increase fluids  Review handout on sinus infection and menorrhagia Put lexapro with tooth brush and take every day,

## 2013-11-08 NOTE — Patient Instructions (Addendum)
Menorrhagia Menorrhagia is the medical term for when your menstrual periods are heavy or last longer than usual. With menorrhagia, every period you have may cause enough blood loss and cramping that you are unable to maintain your usual activities. CAUSES  In some cases, the cause of heavy periods is unknown, but a number of conditions may cause menorrhagia. Common causes include:  A problem with the hormone-producing thyroid gland (hypothyroid).  Noncancerous growths in the uterus (polyps or fibroids).  An imbalance of the estrogen and progesterone hormones.  One of your ovaries not releasing an egg during one or more months.  Side effects of having an intrauterine device (IUD).  Side effects of some medicines, such as anti-inflammatory medicines or blood thinners.  A bleeding disorder that stops your blood from clotting normally. SIGNS AND SYMPTOMS  During a normal period, bleeding lasts between 4 and 8 days. Signs that your periods are too heavy include:  You routinely have to change your pad or tampon every 1 or 2 hours because it is completely soaked.  You pass blood clots larger than 1 inch (2.5 cm) in size.  You have bleeding for more than 7 days.  You need to use pads and tampons at the same time because of heavy bleeding.  You need to wake up to change your pads or tampons during the night.  You have symptoms of anemia, such as tiredness, fatigue, or shortness of breath. DIAGNOSIS  Your health care provider will perform a physical exam and ask you questions about your symptoms and menstrual history. Other tests may be ordered based on what the health care provider finds during the exam. These tests can include:  Blood tests To check if you are pregnant or have hormonal changes, a bleeding or thyroid disorder, low iron levels (anemia), or other problems.  Endometrial biopsy Your health care provider takes a sample of tissue from the inside of your uterus to be examined  under a microscope.  Pelvic ultrasound This test uses sound waves to make a picture of your uterus, ovaries, and vagina. The pictures can show if you have fibroids or other growths.  Hysteroscopy For this test, your health care provider will use a small telescope to look inside your uterus. Based on the results of your initial tests, your health care provider may recommend further testing. TREATMENT  Treatment may not be needed. If it is needed, your health care provider may recommend treatment with one or more medicines first. If these do not reduce bleeding enough, a surgical treatment might be an option. The best treatment for you will depend on:   Whether you need to prevent pregnancy.  Your desire to have children in the future.  The cause and severity of your bleeding.  Your opinion and personal preference.  Medicines for menorrhagia may include:  Birth control methods that use hormones These include the pill, skin patch, vaginal ring, shots that you get every 3 months, hormonal IUD, and implant. These treatments reduce bleeding during your menstrual period.  Medicines that thicken blood and slow bleeding.  Medicines that reduce swelling, such as ibuprofen.  Medicines that contain a synthetic hormone called progestin.   Medicines that make the ovaries stop working for a short time.  You may need surgical treatment for menorrhagia if the medicines are unsuccessful. Treatment options include:  Dilation and curettage (D&C) In this procedure, your health care provider opens (dilates) your cervix and then scrapes or suctions tissue from the lining of your  uterus to reduce menstrual bleeding.  Operative hysteroscopy This procedure uses a tiny tube with a light (hysteroscope) to view your uterine cavity and can help in the surgical removal of a polyp that may be causing heavy periods.  Endometrial ablation Through various techniques, your health care provider permanently  destroys the entire lining of your uterus (endometrium). After endometrial ablation, most women have little or no menstrual flow. Endometrial ablation reduces your ability to become pregnant.  Endometrial resection This surgical procedure uses an electrosurgical wire loop to remove the lining of the uterus. This procedure also reduces your ability to become pregnant.  Hysterectomy Surgical removal of the uterus and cervix is a permanent procedure that stops menstrual periods. Pregnancy is not possible after a hysterectomy. This procedure requires anesthesia and hospitalization. HOME CARE INSTRUCTIONS   Only take over-the-counter or prescription medicines as directed by your health care provider. Take prescribed medicines exactly as directed. Do not change or switch medicines without consulting your health care provider.  Take any prescribed iron pills exactly as directed by your health care provider. Long-term heavy bleeding may result in low iron levels. Iron pills help replace the iron your body lost from heavy bleeding. Iron may cause constipation. If this becomes a problem, increase the bran, fruits, and roughage in your diet.  Do not take aspirin or medicines that contain aspirin 1 week before or during your menstrual period. Aspirin may make the bleeding worse.  If you need to change your sanitary pad or tampon more than once every 2 hours, stay in bed and rest as much as possible until the bleeding stops.  Eat well-balanced meals. Eat foods high in iron. Examples are leafy green vegetables, meat, liver, eggs, and whole grain breads and cereals. Do not try to lose weight until the abnormal bleeding has stopped and your blood iron level is back to normal. SEEK MEDICAL CARE IF:   You soak through a pad or tampon every 1 or 2 hours, and this happens every time you have a period.  You need to use pads and tampons at the same time because you are bleeding so much.  You need to change your pad  or tampon during the night.  You have a period that lasts for more than 8 days.  You pass clots bigger than 1 inch wide.  You have irregular periods that happen more or less often than once a month.  You feel dizzy or faint.  You feel very weak or tired.  You feel short of breath or feel your heart is beating too fast when you exercise.  You have nausea and vomiting or diarrhea while you are taking your medicine.  You have any problems that may be related to the medicine you are taking. SEEK IMMEDIATE MEDICAL CARE IF:   You soak through 4 or more pads or tampons in 2 hours.  You have any bleeding while you are pregnant. MAKE SURE YOU:   Understand these instructions.  Will watch your condition.  Will get help right away if you are not doing well or get worse. Document Released: 08/16/2005 Document Revised: 06/06/2013 Document Reviewed: 02/04/2013 [facility] Patient Information 2014 [place]. GET mammogram Physical in 1 year Return in 1 week for [place] Will talk about labs Sinusitis Sinusitis is redness, soreness, and swelling (inflammation) of the paranasal sinuses. Paranasal sinuses are air pockets within the bones of your face (beneath the eyes, the middle of the forehead, or above the eyes). In healthy paranasal  sinuses, mucus is able to drain out, and air is able to circulate through them by way of your nose. However, when your paranasal sinuses are inflamed, mucus and air can become trapped. This can allow bacteria and other germs to grow and cause infection. Sinusitis can develop quickly and last only a short time (acute) or continue over a long period (chronic). Sinusitis that lasts for more than 12 weeks is considered chronic.  CAUSES  Causes of sinusitis include:  Allergies.  Structural abnormalities, such as displacement of the cartilage that separates your nostrils (deviated septum), which can decrease the air flow through your nose and sinuses and affect sinus  drainage.  Functional abnormalities, such as when the small hairs (cilia) that line your sinuses and help remove mucus do not work properly or are not present. SYMPTOMS  Symptoms of acute and chronic sinusitis are the same. The primary symptoms are pain and pressure around the affected sinuses. Other symptoms include:  Upper toothache.  Earache.  Headache.  Bad breath.  Decreased sense of smell and taste.  A cough, which worsens when you are lying flat.  Fatigue.  Fever.  Thick drainage from your nose, which often is green and may contain pus (purulent).  Swelling and warmth over the affected sinuses. DIAGNOSIS  Your caregiver will perform a physical exam. During the exam, your caregiver may:  Look in your nose for signs of abnormal growths in your nostrils (nasal polyps).  Tap over the affected sinus to check for signs of infection.  View the inside of your sinuses (endoscopy) with a special imaging device with a light attached (endoscope), which is inserted into your sinuses. If your caregiver suspects that you have chronic sinusitis, one or more of the following tests may be recommended:  Allergy tests.  Nasal culture A sample of mucus is taken from your nose and sent to a lab and screened for bacteria.  Nasal cytology A sample of mucus is taken from your nose and examined by your caregiver to determine if your sinusitis is related to an allergy. TREATMENT  Most cases of acute sinusitis are related to a viral infection and will resolve on their own within 10 days. Sometimes medicines are prescribed to help relieve symptoms (pain medicine, decongestants, nasal steroid sprays, or saline sprays).  However, for sinusitis related to a bacterial infection, your caregiver will prescribe antibiotic medicines. These are medicines that will help kill the bacteria causing the infection.  Rarely, sinusitis is caused by a fungal infection. In theses cases, your caregiver will  prescribe antifungal medicine. For some cases of chronic sinusitis, surgery is needed. Generally, these are cases in which sinusitis recurs more than 3 times per year, despite other treatments. HOME CARE INSTRUCTIONS   Drink plenty of water. Water helps thin the mucus so your sinuses can drain more easily.  Use a humidifier.  Inhale steam 3 to 4 times a day (for example, sit in the bathroom with the shower running).  Apply a warm, moist washcloth to your face 3 to 4 times a day, or as directed by your caregiver.  Use saline nasal sprays to help moisten and clean your sinuses.  Take over-the-counter or prescription medicines for pain, discomfort, or fever only as directed by your caregiver. SEEK IMMEDIATE MEDICAL CARE IF:  You have increasing pain or severe headaches.  You have nausea, vomiting, or drowsiness.  You have swelling around your face.  You have vision problems.  You have a stiff neck.  You  have difficulty breathing. MAKE SURE YOU:   Understand these instructions.  Will watch your condition.  Will get help right away if you are not doing well or get worse. Document Released: 08/16/2005 Document Revised: 11/08/2011 Document Reviewed: 08/31/2011 [facility] Patient Information 2014 [place], [place].

## 2013-11-09 ENCOUNTER — Telehealth: Payer: Self-pay | Admitting: Adult Health

## 2013-11-09 LAB — CBC
HCT: 38 % (ref 36.0–46.0)
Hemoglobin: 12.2 g/dL (ref 12.0–15.0)
MCH: 24.3 pg — ABNORMAL LOW (ref 26.0–34.0)
MCHC: 32.1 g/dL (ref 30.0–36.0)
MCV: 75.7 fL — ABNORMAL LOW (ref 78.0–100.0)
Platelets: 274 10*3/uL (ref 150–400)
RBC: 5.02 MIL/uL (ref 3.87–5.11)
RDW: 15.5 % (ref 11.5–15.5)
WBC: 7.7 10*3/uL (ref 4.0–10.5)

## 2013-11-09 LAB — COMPREHENSIVE METABOLIC PANEL
ALT: 13 U/L (ref 0–35)
AST: 16 U/L (ref 0–37)
Albumin: 4.2 g/dL (ref 3.5–5.2)
Alkaline Phosphatase: 64 U/L (ref 39–117)
BUN: 6 mg/dL (ref 6–23)
CO2: 27 mEq/L (ref 19–32)
Calcium: 9.1 mg/dL (ref 8.4–10.5)
Chloride: 103 mEq/L (ref 96–112)
Creat: 0.52 mg/dL (ref 0.50–1.10)
Glucose, Bld: 87 mg/dL (ref 70–99)
Potassium: 3.9 mEq/L (ref 3.5–5.3)
Sodium: 142 mEq/L (ref 135–145)
Total Bilirubin: 0.5 mg/dL (ref 0.2–1.2)
Total Protein: 6.9 g/dL (ref 6.0–8.3)

## 2013-11-09 LAB — LIPID PANEL
Cholesterol: 141 mg/dL (ref 0–200)
HDL: 51 mg/dL (ref >39)
LDL Cholesterol: 73 mg/dL (ref 0–99)
Total CHOL/HDL Ratio: 2.8 Ratio
Triglycerides: 86 mg/dL (ref <150)
VLDL: 17 mg/dL (ref 0–40)

## 2013-11-09 LAB — GC/CHLAMYDIA PROBE AMP
CT Probe RNA: NEGATIVE
GC Probe RNA: NEGATIVE

## 2013-11-09 LAB — TSH: TSH: 1.38 u[IU]/mL (ref 0.350–4.500)

## 2013-11-09 NOTE — Telephone Encounter (Signed)
Pt aware labs good and GC/CHL negative

## 2013-11-12 ENCOUNTER — Telehealth: Payer: Self-pay | Admitting: Adult Health

## 2013-11-12 MED ORDER — CEPHALEXIN 500 MG PO CAPS: 500.0000 mg | ORAL_CAPSULE | Freq: Four times a day (QID) | ORAL | Status: DC

## 2013-11-12 NOTE — Telephone Encounter (Signed)
Took Zpak feels no better, had fever and ocugh and ears hurt will rx keflex if no better make appt.

## 2013-11-15 ENCOUNTER — Ambulatory Visit: Payer: BC Managed Care – PPO | Admitting: Adult Health

## 2013-11-15 ENCOUNTER — Other Ambulatory Visit: Payer: BC Managed Care – PPO

## 2013-11-22 ENCOUNTER — Other Ambulatory Visit: Payer: BC Managed Care – PPO

## 2013-11-22 ENCOUNTER — Ambulatory Visit: Payer: BC Managed Care – PPO | Admitting: Adult Health

## 2014-01-08 ENCOUNTER — Ambulatory Visit (INDEPENDENT_AMBULATORY_CARE_PROVIDER_SITE_OTHER): Payer: BC Managed Care – PPO

## 2014-01-08 DIAGNOSIS — N920 Excessive and frequent menstruation with regular cycle: Secondary | ICD-10-CM

## 2014-01-28 ENCOUNTER — Telehealth: Payer: Self-pay | Admitting: Obstetrics and Gynecology

## 2014-01-28 NOTE — Telephone Encounter (Signed)
Pt aware of [place] results, discussed ablation as option and she is interested will call and get Amy to check insurance for her first

## 2014-03-25 ENCOUNTER — Ambulatory Visit (INDEPENDENT_AMBULATORY_CARE_PROVIDER_SITE_OTHER): Payer: BC Managed Care – PPO | Admitting: Family Medicine

## 2014-03-25 ENCOUNTER — Encounter: Payer: Self-pay | Admitting: Family Medicine

## 2014-03-25 VITALS — BP 120/76 | Temp 98.1°F | Ht 65.5 in | Wt 182.8 lb

## 2014-03-25 DIAGNOSIS — M545 Low back pain, unspecified: Secondary | ICD-10-CM

## 2014-03-25 MED ORDER — CHLORZOXAZONE 500 MG PO TABS: 500.0000 mg | ORAL_TABLET | Freq: Four times a day (QID) | ORAL | Status: DC | PRN

## 2014-03-25 MED ORDER — NABUMETONE 750 MG PO TABS: 750.0000 mg | ORAL_TABLET | Freq: Two times a day (BID) | ORAL | Status: AC

## 2014-03-25 NOTE — Progress Notes (Signed)
   Subjective:    Patient ID: Kelsey Cooke, female    DOB: 01-16-1971, 43 y.o.   MRN: [id number]  Otalgia  There is pain in the left ear.   Ear pain for 3 weeks off and on.  Felt irritated, hurts at times  dulll aching to the left side of the neck  ibu prn  6 otc tod ay   Patient also having back pain and sciatica for several days. Is worse in the left lower lumbar region. Worse with certain motions. Does not radiate below the head. Fairly severe times.  No cough runny nose or congestion.  Using ibuprofen when necessary.  No major change in urinary habits. Not exercised in two wks   Review of Systems  HENT: Positive for ear pain.    no vomiting no diarrhea no fever ROS otherwise negative     Objective:   Physical Exam Alert no acute distress. Vitals stable. Lungs clear. Heart regular rate and rhythm. Left lower lumbar region tender to deep palpation. Negative straight leg raise. TMs normal frontal neck supple some TMJ pain to deep palpation bilateral       Assessment & Plan:  Impression 1 lumbar strain discussed #2 TMJ pain discuss plan cut down come chewing. Relafen 750 twice a day with food when necessary. Chlorzoxazone when necessary. Symptomatic care discussed. WSL

## 2014-07-01 ENCOUNTER — Encounter: Payer: Self-pay | Admitting: Family Medicine

## 2014-07-23 ENCOUNTER — Encounter: Payer: Self-pay | Admitting: Obstetrics & Gynecology

## 2014-07-23 ENCOUNTER — Ambulatory Visit (INDEPENDENT_AMBULATORY_CARE_PROVIDER_SITE_OTHER): Payer: BC Managed Care – PPO | Admitting: Obstetrics & Gynecology

## 2014-07-23 VITALS — BP 130/90 | Wt 182.0 lb

## 2014-07-23 DIAGNOSIS — N920 Excessive and frequent menstruation with regular cycle: Secondary | ICD-10-CM

## 2014-07-23 MED ORDER — MEGESTROL ACETATE 40 MG PO TABS: 40.0000 mg | ORAL_TABLET | Freq: Every day | ORAL | Status: DC

## 2014-09-06 ENCOUNTER — Ambulatory Visit (INDEPENDENT_AMBULATORY_CARE_PROVIDER_SITE_OTHER): Payer: BLUE CROSS/BLUE SHIELD | Admitting: Nurse Practitioner

## 2014-09-06 ENCOUNTER — Encounter: Payer: Self-pay | Admitting: Nurse Practitioner

## 2014-09-06 VITALS — BP 122/84 | Temp 98.6°F | Ht 65.0 in | Wt 183.0 lb

## 2014-09-06 DIAGNOSIS — J019 Acute sinusitis, unspecified: Secondary | ICD-10-CM

## 2014-09-06 MED ORDER — AMOXICILLIN-POT CLAVULANATE 875-125 MG PO TABS: 1.0000 | ORAL_TABLET | Freq: Two times a day (BID) | ORAL | Status: DC

## 2014-09-06 MED ORDER — HYDROCODONE-HOMATROPINE 5-1.5 MG/5ML PO SYRP: 5.0000 mL | ORAL_SOLUTION | ORAL | Status: DC | PRN

## 2014-09-06 MED ORDER — SCOPOLAMINE 1 MG/3DAYS TD PT72: 1.0000 | MEDICATED_PATCH | TRANSDERMAL | Status: DC

## 2014-09-08 ENCOUNTER — Encounter: Payer: Self-pay | Admitting: Nurse Practitioner

## 2014-09-08 NOTE — Progress Notes (Signed)
Subjective:  Presents for c/o sinus problems that began on 1/2. No relief with OTC meds. Frontal area headache. Facial pressure. Now coughing all night. No wheezing. Runny nose. Sore throat. No fever. Off/on ear pain and pressure. Planning cruise at end of month. Requesting patches for motion sickness.  Objective:   BP 122/84 mmHg  Temp(Src) 98.6 F (37 C) (Oral)  Ht 5\' 5"  (1.651 m)  Wt 183 lb (83.008 kg)  BMI 30.45 kg/m2 NAD. Alert, oriented. TMs clear effusion. Pharynx mildly injected with green PND. Neck supple with mild anterior adenopathy. Lungs clear. Heart RRR.  Assessment: Acute rhinosinusitis  Plan:  Meds ordered this encounter  Medications  . amoxicillin-clavulanate (AUGMENTIN) 875-125 MG per tablet    Sig: Take 1 tablet by mouth 2 (two) times daily.    Dispense:  20 tablet    Refill:  0    Order Specific Question:  Supervising Provider    Answer:  Mikey Kirschner [[id number]]  . HYDROcodone-homatropine (HYCODAN) 5-1.5 MG/5ML syrup    Sig: Take 5 mLs by mouth every 4 (four) hours as needed.    Dispense:  120 mL    Refill:  0    Order Specific Question:  Supervising Provider    Answer:  Mikey Kirschner [[id number]]  . scopolamine (TRANSDERM-SCOP) 1 MG/3DAYS    Sig: Place 1 patch (1.5 mg total) onto the skin every 3 (three) days.    Dispense:  4 patch    Refill:  2    Order Specific Question:  Supervising Provider    Answer:  Mikey Kirschner [[id number]]   Continue OTC meds as directed. Return if symptoms worsen or fail to improve.

## 2014-09-17 ENCOUNTER — Other Ambulatory Visit: Payer: Self-pay | Admitting: Nurse Practitioner

## 2014-09-17 ENCOUNTER — Telehealth: Payer: Self-pay | Admitting: Family Medicine

## 2014-09-17 MED ORDER — LEVOFLOXACIN 500 MG PO TABS: 500.0000 mg | ORAL_TABLET | Freq: Every day | ORAL | Status: DC

## 2014-09-17 NOTE — Telephone Encounter (Signed)
Pt seen by Hoyle Sauer on 1/8 an was given amoxicillin She states she is still having congestion, drainage,  Glad still swollen   Wants to know if we can all in another round of antibiotic  [facility]

## 2014-09-17 NOTE — Telephone Encounter (Signed)
Neck feels tender, cough is better, sinus drainage in throat, drainage yellow/green. No fever and no wheezing. Pt is better but symptoms not completely gone. Pt is getting ready to go out of town and would like another antibiotic.

## 2014-09-17 NOTE — Telephone Encounter (Signed)
Make sure no possibility of pregnancy. Will switch to Levaquin. Call back if persists.

## 2014-09-17 NOTE — Telephone Encounter (Signed)
LMRC

## 2014-09-18 NOTE — Telephone Encounter (Signed)
Discussed with pt. She states no possibility of pregnancy.

## 2014-10-30 NOTE — Progress Notes (Signed)
Patient ID: Kelsey Cooke, female   DOB: 1971-06-30, 44 y.o.   MRN: [id number] Pt has been having increased problems over the past year or so with heavy periods with clots and increased cramping Had sonogram in July which was essentially normal thickened endometrium but normal for a menstruating woman Has been considering an ablation but still unsure We discussed IUD vs megace vs ablation as future management options  She know she can't opt for any foing forward She wants to be conservative and try megestrol at least pre op and then see how things go Cost is also a concern  Rx megestrol 40mg   daily  Face to face time 15 minutes Greater than 50% of time was spent in counseling as outlined above All questions were answered

## 2014-11-12 ENCOUNTER — Encounter: Payer: Self-pay | Admitting: Adult Health

## 2014-11-12 ENCOUNTER — Ambulatory Visit (INDEPENDENT_AMBULATORY_CARE_PROVIDER_SITE_OTHER): Payer: BLUE CROSS/BLUE SHIELD | Admitting: Adult Health

## 2014-11-12 VITALS — BP 132/90 | HR 82 | Ht 64.5 in | Wt 182.0 lb

## 2014-11-12 DIAGNOSIS — Z1212 Encounter for screening for malignant neoplasm of rectum: Secondary | ICD-10-CM

## 2014-11-12 DIAGNOSIS — Z01419 Encounter for gynecological examination (general) (routine) without abnormal findings: Secondary | ICD-10-CM | POA: Diagnosis not present

## 2014-11-12 DIAGNOSIS — N816 Rectocele: Secondary | ICD-10-CM

## 2014-11-12 DIAGNOSIS — IMO0001 Reserved for inherently not codable concepts without codable children: Secondary | ICD-10-CM | POA: Insufficient documentation

## 2014-11-12 DIAGNOSIS — K649 Unspecified hemorrhoids: Secondary | ICD-10-CM | POA: Insufficient documentation

## 2014-11-12 DIAGNOSIS — R5383 Other fatigue: Secondary | ICD-10-CM

## 2014-11-12 DIAGNOSIS — R030 Elevated blood-pressure reading, without diagnosis of hypertension: Secondary | ICD-10-CM

## 2014-11-12 HISTORY — DX: Reserved for inherently not codable concepts without codable children: IMO0001

## 2014-11-12 HISTORY — DX: Unspecified hemorrhoids: K64.9

## 2014-11-12 HISTORY — DX: Rectocele: N81.6

## 2014-11-12 HISTORY — DX: Other fatigue: R53.83

## 2014-11-12 LAB — HEMOCCULT GUIAC POC 1CARD (OFFICE): Fecal Occult Blood, POC: NEGATIVE

## 2014-11-12 NOTE — Progress Notes (Signed)
Patient ID: Kelsey Cooke, female   DOB: July 27, 1971, 44 y.o.   MRN: [id number] History of Present Illness:  Kelsey Cooke is a 44 year old white female, married in for well woman gyn exam.She had a normal pap with negative HPV 11/08/13.She does complain of fatigue"no energy" and hair coarse and has tingles,pain in chest at times,not sure if anxiety or not.Has some rectal itching at times.  Current Medications, Allergies, Past Medical History, Past Surgical History, Family History and Social History were reviewed in [employer] record.     Review of Systems: Patient denies any headaches, hearing loss,  blurred vision, shortness of breath,  abdominal pain, problems with bowel movements, urination, or intercourse. No joint pain or mood swings.     Physical Exam:BP 132/90 mmHg  Pulse 82  Ht 5' 4.5" (1.638 m)  Wt 182 lb (82.555 kg)  BMI 30.77 kg/m2  LMP 10/29/2014 BP was 140/90 left and 144/88 right on arrival General:  Well developed, well nourished, no acute distress Skin:  Warm and dry Neck:  Midline trachea, normal thyroid, good ROM, no lymphadenopathy Lungs; Clear to auscultation bilaterally Breast:  No dominant palpable mass, retraction, or nipple discharge Cardiovascular: Regular rate and rhythm Abdomen:  Soft, non tender, no hepatosplenomegaly Pelvic:  External genitalia is normal in appearance, no lesions.  The vagina is normal in appearance. Urethra has no lesions or masses. The cervix is bulbous.  Uterus is felt to be normal size, shape, and contour.  No adnexal masses or tenderness noted.Bladder is non tender, no masses felt.GC/CHL obtained. Rectal: Good sphincter tone, no polyps, internal hemorrhoids felt.  Hemoccult negative.Mild rectocele Extremities/musculoskeletal:  No swelling or varicosities noted, no clubbing or cyanosis, has skin tag under right arm Psych:  No mood changes, alert and cooperative,seems happy She has labs at work.  Impression: Well  woman gyn exam with no pap Fatigue  Rectocele Hemorrhoids Elevated BP     Plan: Try preparation H GC/CHL sent Check TSH Decrease salt and sugars and increase walking and try for weight loss,review handout on hypertension Return in 4 weeks for BP check Get mammogram  Physical in 1 year

## 2014-11-12 NOTE — Patient Instructions (Signed)
Hypertension Hypertension, commonly called high blood pressure, is when the force of blood pumping through your arteries is too strong. Your arteries are the blood vessels that carry blood from your heart throughout your body. A blood pressure reading consists of a higher number over a lower number, such as 110/72. The higher number (systolic) is the pressure inside your arteries when your heart pumps. The lower number (diastolic) is the pressure inside your arteries when your heart relaxes. Ideally you want your blood pressure below 120/80. Hypertension forces your heart to work harder to pump blood. Your arteries may become narrow or stiff. Having hypertension puts you at risk for heart disease, stroke, and other problems.  RISK FACTORS Some risk factors for high blood pressure are controllable. Others are not.  Risk factors you cannot control include:   Race. You may be at higher risk if you are African American.  Age. Risk increases with age.  Gender. Men are at higher risk than women before age 45 years. After age 65, women are at higher risk than men. Risk factors you can control include:  Not getting enough exercise or physical activity.  Being overweight.  Getting too much fat, sugar, calories, or salt in your diet.  Drinking too much alcohol. SIGNS AND SYMPTOMS Hypertension does not usually cause signs or symptoms. Extremely high blood pressure (hypertensive crisis) may cause headache, anxiety, shortness of breath, and nosebleed. DIAGNOSIS  To check if you have hypertension, your health care provider will measure your blood pressure while you are seated, with your arm held at the level of your heart. It should be measured at least twice using the same arm. Certain conditions can cause a difference in blood pressure between your right and left arms. A blood pressure reading that is higher than normal on one occasion does not mean that you need treatment. If one blood pressure reading  is high, ask your health care provider about having it checked again. TREATMENT  Treating high blood pressure includes making lifestyle changes and possibly taking medicine. Living a healthy lifestyle can help lower high blood pressure. You may need to change some of your habits. Lifestyle changes may include:  Following the DASH diet. This diet is high in fruits, vegetables, and whole grains. It is low in salt, red meat, and added sugars.  Getting at least 2 hours of brisk physical activity every week.  Losing weight if necessary.  Not smoking.  Limiting alcoholic beverages.  Learning ways to reduce stress. If lifestyle changes are not enough to get your blood pressure under control, your health care provider may prescribe medicine. You may need to take more than one. Work closely with your health care provider to understand the risks and benefits. HOME CARE INSTRUCTIONS  Have your blood pressure rechecked as directed by your health care provider.   Take medicines only as directed by your health care provider. Follow the directions carefully. Blood pressure medicines must be taken as prescribed. The medicine does not work as well when you skip doses. Skipping doses also puts you at risk for problems.   Do not smoke.   Monitor your blood pressure at home as directed by your health care provider. SEEK MEDICAL CARE IF:   You think you are having a reaction to medicines taken.  You have recurrent headaches or feel dizzy.  You have swelling in your ankles.  You have trouble with your vision. SEEK IMMEDIATE MEDICAL CARE IF:  You develop a severe headache or confusion.    You have unusual weakness, numbness, or feel faint.  You have severe chest or abdominal pain.  You vomit repeatedly.  You have trouble breathing. MAKE SURE YOU:   Understand these instructions.  Will watch your condition.  Will get help right away if you are not doing well or get worse. Document  Released: 08/16/2005 Document Revised: 12/31/2013 Document Reviewed: 06/08/2013 [facility] Patient Information 2015 [place], [place]. This information is not intended to replace advice given to you by your health care provider. Make sure you discuss any questions you have with your health care provider. Decrease salt and sugar and try to lose weight Recheck BP in 4 weeks Get mammogram physical in 1 year Increase walking

## 2014-11-13 ENCOUNTER — Telehealth: Payer: Self-pay | Admitting: Adult Health

## 2014-11-13 LAB — GC/CHLAMYDIA PROBE AMP
Chlamydia trachomatis, NAA: NEGATIVE
Neisseria gonorrhoeae by PCR: NEGATIVE

## 2014-11-13 LAB — TSH: TSH: 2.44 u[IU]/mL (ref 0.450–4.500)

## 2014-11-13 NOTE — Telephone Encounter (Signed)
Left message TSH normal

## 2014-12-10 ENCOUNTER — Telehealth: Payer: Self-pay | Admitting: Adult Health

## 2014-12-10 NOTE — Telephone Encounter (Signed)
Left message that labs look good 

## 2014-12-11 ENCOUNTER — Ambulatory Visit: Payer: BLUE CROSS/BLUE SHIELD | Admitting: Adult Health

## 2014-12-11 ENCOUNTER — Ambulatory Visit (INDEPENDENT_AMBULATORY_CARE_PROVIDER_SITE_OTHER): Payer: BLUE CROSS/BLUE SHIELD | Admitting: Adult Health

## 2014-12-11 ENCOUNTER — Encounter: Payer: Self-pay | Admitting: Adult Health

## 2014-12-11 VITALS — BP 120/80 | HR 72 | Ht 64.5 in | Wt 186.0 lb

## 2014-12-11 DIAGNOSIS — R030 Elevated blood-pressure reading, without diagnosis of hypertension: Secondary | ICD-10-CM | POA: Diagnosis not present

## 2014-12-11 DIAGNOSIS — IMO0001 Reserved for inherently not codable concepts without codable children: Secondary | ICD-10-CM

## 2014-12-11 MED ORDER — HYDROCHLOROTHIAZIDE 12.5 MG PO CAPS: 12.5000 mg | ORAL_CAPSULE | Freq: Every day | ORAL | Status: DC

## 2014-12-11 NOTE — Patient Instructions (Signed)

## 2014-12-11 NOTE — Progress Notes (Signed)
Subjective:     Patient ID: Kelsey Cooke, female   DOB: 12-Jan-1971, 44 y.o.   MRN: [id number]  HPI Kelsey Cooke is a 44 year old white female in for BP check,she said it was 180/84 yesterday at work and 140/80 after exercise, has been stressed and has felt light headed and dizzy at times with faint headache.Some tightness in legs.  Review of Systems See HPI for positives, all other systems negative Reviewed past medical,surgical, social and family history. Reviewed medications and allergies.     Objective:   Physical Exam BP 120/80 mmHg  Pulse 72  Ht 5' 4.5" (1.638 m)  Wt 186 lb (84.369 kg)  BMI 31.45 kg/m2  LMP 11/13/2014 Skin warm and dry. Lungs: clear to ausculation bilaterally. Cardiovascular: regular rate and rhythm.Some swelling in feet and ankles, non pitting, discussed trying fluid pill to see if helps and she agrees, declines any meds for stress.    Assessment:     Elevated BP    Plan:     Rx microzide 12,5 mg #30 1 daily with 3 refills Decrease salt and carbs Exercise  Recheck BP in 4 weeks Ok to take zyrtec  Review handout on hypertension

## 2015-01-08 ENCOUNTER — Encounter: Payer: Self-pay | Admitting: Adult Health

## 2015-01-08 ENCOUNTER — Ambulatory Visit (INDEPENDENT_AMBULATORY_CARE_PROVIDER_SITE_OTHER): Payer: BLUE CROSS/BLUE SHIELD | Admitting: Adult Health

## 2015-01-08 VITALS — BP 118/80 | HR 72 | Ht 65.5 in | Wt 187.5 lb

## 2015-01-08 DIAGNOSIS — R030 Elevated blood-pressure reading, without diagnosis of hypertension: Secondary | ICD-10-CM

## 2015-01-08 DIAGNOSIS — R51 Headache: Secondary | ICD-10-CM

## 2015-01-08 DIAGNOSIS — R5383 Other fatigue: Secondary | ICD-10-CM

## 2015-01-08 DIAGNOSIS — R519 Headache, unspecified: Secondary | ICD-10-CM | POA: Insufficient documentation

## 2015-01-08 DIAGNOSIS — IMO0001 Reserved for inherently not codable concepts without codable children: Secondary | ICD-10-CM

## 2015-01-08 HISTORY — DX: Headache, unspecified: R51.9

## 2015-01-08 MED ORDER — CYCLOBENZAPRINE HCL 5 MG PO TABS: 5.0000 mg | ORAL_TABLET | Freq: Three times a day (TID) | ORAL | Status: DC | PRN

## 2015-01-08 NOTE — Patient Instructions (Signed)
Will talk when labs back Tension Headache A tension headache is a feeling of pain, pressure, or aching often felt over the front and sides of the head. The pain can be dull or can feel tight (constricting). It is the most common type of headache. Tension headaches are not normally associated with nausea or vomiting and do not get worse with physical activity. Tension headaches can last 30 minutes to several days.  CAUSES  The exact cause is not known, but it may be caused by chemicals and hormones in the brain that lead to pain. Tension headaches often begin after stress, anxiety, or depression. Other triggers may include:  Alcohol.  Caffeine (too much or withdrawal).  Respiratory infections (colds, flu, sinus infections).  Dental problems or teeth clenching.  Fatigue.  Holding your head and neck in one position too long while using a computer. SYMPTOMS   Pressure around the head.   Dull, aching head pain.   Pain felt over the front and sides of the head.   Tenderness in the muscles of the head, neck, and shoulders. DIAGNOSIS  A tension headache is often diagnosed based on:   Symptoms.   Physical examination.   A CT scan or MRI of your head. These tests may be ordered if symptoms are severe or unusual. TREATMENT  Medicines may be given to help relieve symptoms.  HOME CARE INSTRUCTIONS   Only take over-the-counter or prescription medicines for pain or discomfort as directed by your caregiver.   Lie down in a dark, quiet room when you have a headache.   Keep a journal to find out what may be triggering your headaches. For example, write down:  What you eat and drink.  How much sleep you get.  Any change to your diet or medicines.  Try massage or other relaxation techniques.   Ice packs or heat applied to the head and neck can be used. Use these 3 to 4 times per day for 15 to 20 minutes each time, or as needed.   Limit stress.   Sit up straight, and do  not tense your muscles.   Quit smoking if you smoke.  Limit alcohol use.  Decrease the amount of caffeine you drink, or stop drinking caffeine.  Eat and exercise regularly.  Get 7 to 9 hours of sleep, or as recommended by your caregiver.  Avoid excessive use of pain medicine as recurrent headaches can occur.  SEEK MEDICAL CARE IF:   You have problems with the medicines you were prescribed.  Your medicines do not work.  You have a change from the usual headache.  You have nausea or vomiting. SEEK IMMEDIATE MEDICAL CARE IF:   Your headache becomes severe.  You have a fever.  You have a stiff neck.  You have loss of vision.  You have muscular weakness or loss of muscle control.  You lose your balance or have trouble walking.  You feel faint or pass out.  You have severe symptoms that are different from your first symptoms. MAKE SURE YOU:   Understand these instructions.  Will watch your condition.  Will get help right away if you are not doing well or get worse. Document Released: 08/16/2005 Document Revised: 11/08/2011 Document Reviewed: 08/06/2011 [facility] Patient Information 2015 [place], [place]. This information is not intended to replace advice given to you by your health care provider. Make sure you discuss any questions you have with your health care provider. Get eye exam

## 2015-01-08 NOTE — Progress Notes (Signed)
Subjective:     Patient ID: Kelsey Cooke, female   DOB: 07-07-1971, 44 y.o.   MRN: [id number]  HPI [name] is a 44 year old white female in for BP check has ben taking Microzide 12.5 mg, she has had headache since Tuesday and vision blurry at times, had had sinus infection, feels tired.  Review of Systems See positives in HPI, all other systems negative Reviewed past medical,surgical, social and family history. Reviewed medications and allergies.     Objective:   Physical Exam BP 118/80 mmHg  Pulse 72  Ht 5' 5.5" (1.664 m)  Wt 187 lb 8 oz (85.049 kg)  BMI 30.72 kg/m2  LMP 12/25/2014, Skin warm and dry, no swelling in feet and ankles, no pain in neck or shoulders but tight  And says that headache starts in neck and stops at temples.    Assessment:     Headache ?tension Fatigue Hx elevated BP    Plan:     Check CBC and CMP Rx flexeril 5 mg #30 1 every 8 hours prn no refills Review handout on tension headache Try ice Get eye exam will talk when labs back

## 2015-01-09 ENCOUNTER — Telehealth: Payer: Self-pay | Admitting: Adult Health

## 2015-01-09 LAB — COMPREHENSIVE METABOLIC PANEL
ALT: 14 IU/L (ref 0–32)
AST: 16 IU/L (ref 0–40)
Albumin/Globulin Ratio: 1.5 (ref 1.1–2.5)
Albumin: 4.6 g/dL (ref 3.5–5.5)
Alkaline Phosphatase: 87 IU/L (ref 39–117)
BUN/Creatinine Ratio: 12 (ref 9–23)
BUN: 7 mg/dL (ref 6–24)
Bilirubin Total: 0.5 mg/dL (ref 0.0–1.2)
CO2: 29 mmol/L (ref 18–29)
Calcium: 9.7 mg/dL (ref 8.7–10.2)
Chloride: 97 mmol/L (ref 97–108)
Creatinine, Ser: 0.58 mg/dL (ref 0.57–1.00)
GFR calc Af Amer: 129 mL/min/{1.73_m2} (ref >59)
GFR calc non Af Amer: 112 mL/min/{1.73_m2} (ref >59)
Globulin, Total: 3 g/dL (ref 1.5–4.5)
Glucose: 94 mg/dL (ref 65–99)
Potassium: 3.5 mmol/L (ref 3.5–5.2)
Sodium: 142 mmol/L (ref 134–144)
Total Protein: 7.6 g/dL (ref 6.0–8.5)

## 2015-01-09 LAB — CBC
Hematocrit: 40.3 % (ref 34.0–46.6)
Hemoglobin: 13.5 g/dL (ref 11.1–15.9)
MCH: 25.3 pg — ABNORMAL LOW (ref 26.6–33.0)
MCHC: 33.5 g/dL (ref 31.5–35.7)
MCV: 76 fL — ABNORMAL LOW (ref 79–97)
Platelets: 339 10*3/uL (ref 150–379)
RBC: 5.34 x10E6/uL — ABNORMAL HIGH (ref 3.77–5.28)
RDW: 14.6 % (ref 12.3–15.4)
WBC: 8.1 10*3/uL (ref 3.4–10.8)

## 2015-01-09 NOTE — Telephone Encounter (Signed)
Pt aware of labs, take MV with fe and she took flexeril and feels a little better,has heart burn, try prilosec OTC and let me know how she feels in day or so

## 2015-01-13 ENCOUNTER — Telehealth: Payer: Self-pay | Admitting: Adult Health

## 2015-01-13 MED ORDER — AZITHROMYCIN 250 MG PO TABS: ORAL_TABLET | ORAL | Status: DC

## 2015-01-13 NOTE — Telephone Encounter (Signed)
Spoke with pt. Pt has drainage in back of throat and is dizzy headed. She now wonders if she has a sinus infection. Flexeril is not helping headaches. Please advise. Thanks!! [place]

## 2015-01-13 NOTE — Telephone Encounter (Signed)
Left message x 1. JSY 

## 2015-01-22 ENCOUNTER — Telehealth: Payer: Self-pay | Admitting: Adult Health

## 2015-01-22 NOTE — Telephone Encounter (Signed)
Felt better but has drainage and headache again, just finished Zpack and going to beach this week , if not better when get s back call may get CT

## 2015-07-10 ENCOUNTER — Other Ambulatory Visit: Payer: Self-pay | Admitting: *Deleted

## 2015-07-10 MED ORDER — AZITHROMYCIN 250 MG PO TABS: ORAL_TABLET | ORAL | Status: DC

## 2015-07-21 ENCOUNTER — Ambulatory Visit (INDEPENDENT_AMBULATORY_CARE_PROVIDER_SITE_OTHER): Payer: BLUE CROSS/BLUE SHIELD | Admitting: Family Medicine

## 2015-07-21 ENCOUNTER — Encounter: Payer: Self-pay | Admitting: Family Medicine

## 2015-07-21 VITALS — BP 122/74 | Temp 98.8°F | Ht 64.0 in | Wt 189.0 lb

## 2015-07-21 DIAGNOSIS — J069 Acute upper respiratory infection, unspecified: Secondary | ICD-10-CM

## 2015-07-21 DIAGNOSIS — R21 Rash and other nonspecific skin eruption: Secondary | ICD-10-CM

## 2015-07-21 DIAGNOSIS — K219 Gastro-esophageal reflux disease without esophagitis: Secondary | ICD-10-CM | POA: Diagnosis not present

## 2015-07-21 MED ORDER — PANTOPRAZOLE SODIUM 40 MG PO TBEC: 40.0000 mg | DELAYED_RELEASE_TABLET | ORAL | Status: DC

## 2015-07-21 NOTE — Progress Notes (Signed)
   Subjective:    Patient ID: Kelsey Cooke, female    DOB: 18-Jul-1971, 44 y.o.   MRN: [id number]  Gastroesophageal Reflux The current episode started more than 1 year ago. The symptoms are aggravated by certain foods. Treatments tried: tums, gas x.  gas built up under ribs, and felt discomfort gas x helps  Hx of pain radiaited pain to the back  Takes tums and it helps, noted worse after  drtain meals, worse recently   Left ear pain. Pt states there is a growth in her ear.swelling noted outer ear canal, noticied about 1 year ago.   Runny nose. z pk took it all up, Hx of strep a while back   Mole under right arm. Skin tag catches at times would like a gone. Would like to avoid dermatology referral  Declines flu vaccine.    Review of Systems No headache no chest pain no fever    Objective:   Physical Exam  Alert vital stable HEENT slight nasal congestion for a normal neck supple lungs clear heart rare rhythm right axillary region small skin tag noted benign in nature patient prepped draped anesthetized this was removed. Abdomen benign. Left external canal normal.      Assessment & Plan:  Impression 1 reflux discussed going #2 URI discuss #3 left ear symptoms no evidence of difficulty #4 right arm tag taking care of plan initiate protonic. Diet discussed. Hold off on further antibiotics. No need for dermatology referral [facility]

## 2015-07-21 NOTE — Patient Instructions (Signed)

## 2015-07-29 ENCOUNTER — Telehealth: Payer: Self-pay | Admitting: Family Medicine

## 2015-07-29 MED ORDER — AMOXICILLIN 500 MG PO CAPS: 500.0000 mg | ORAL_CAPSULE | Freq: Three times a day (TID) | ORAL | Status: DC

## 2015-07-29 NOTE — Telephone Encounter (Signed)
Patient was seen 07/21/2015 for acute URI.  She opted out of having an antibiotic called in.  She said her symptoms have gotten worse.  She has facial pressure, nagging sore throat, green congestion, sinus trouble.  She would like to go ahead and have antibiotic called in.   [facility]

## 2015-07-29 NOTE — Telephone Encounter (Signed)
Per Dr. Nicki Reaper- Amoxicillin 500 mg TID x 10 days. Med sent to pharmacy. Patient was notified.

## 2015-08-26 ENCOUNTER — Telehealth: Payer: Self-pay | Admitting: Family Medicine

## 2015-08-26 ENCOUNTER — Other Ambulatory Visit: Payer: Self-pay | Admitting: *Deleted

## 2015-08-26 MED ORDER — CEFPROZIL 500 MG PO TABS: 500.0000 mg | ORAL_TABLET | Freq: Two times a day (BID) | ORAL | Status: DC

## 2015-08-26 NOTE — Telephone Encounter (Signed)
Pt seen 07/21/15, finished meds & got better but not 100% Pt now with HA, sore throat, sinus pain & pressure - started Tuesday (last week) Started Musinex & Nyquil - not much help   Can we call in another round of antibiotics or NTBS?   [facility]/

## 2015-08-26 NOTE — Telephone Encounter (Signed)
Med sent to pharm. Pt notified.  

## 2015-08-26 NOTE — Telephone Encounter (Signed)
cefzil 500 bid ten d 

## 2015-08-28 ENCOUNTER — Telehealth: Payer: Self-pay | Admitting: *Deleted

## 2015-08-28 MED ORDER — LEVOFLOXACIN 500 MG PO TABS: ORAL_TABLET | ORAL | Status: DC

## 2015-08-28 NOTE — Telephone Encounter (Signed)
Called patient and informed her per Dr.Steve Luking-theoretically possible reaction to cefzil, enter as an allergy, levaquin 500  Once a day for 10 days. If knot reoccurs not cefzil and will be happy to re evaluate. Patient verbalized understanding

## 2015-08-28 NOTE — Telephone Encounter (Signed)
Theoretically possible rxn to cefzil, enter as an allergy, levaquin 500 qd for ten d, if knot recurs not cefzil and willl be happy t o re evaluate

## 2015-08-28 NOTE — Telephone Encounter (Signed)
Pt seen 11/21 for uri. Did not get rx that day. Called backon 11-29 and got rx for amoxil. Called back on 12/27 to report Got better but not 100% and reqeusting something else. cefzil was sent in. Pt states after starting that antibiotic a knot the size of a quarter came up on her face in her cheek area. Went away the next am. Still having sinus congestion, headache, and neck pain, tender on the side where the knot was and tingling in the area where the knot was. Advised pt she needs to be reseen but wanted note to go back and ask doctor. [facility] [place]

## 2015-10-09 ENCOUNTER — Encounter: Payer: Self-pay | Admitting: Adult Health

## 2015-10-09 ENCOUNTER — Ambulatory Visit (INDEPENDENT_AMBULATORY_CARE_PROVIDER_SITE_OTHER): Payer: BLUE CROSS/BLUE SHIELD | Admitting: Adult Health

## 2015-10-09 VITALS — BP 130/90 | HR 70 | Ht 65.5 in | Wt 193.0 lb

## 2015-10-09 DIAGNOSIS — K219 Gastro-esophageal reflux disease without esophagitis: Secondary | ICD-10-CM | POA: Diagnosis not present

## 2015-10-09 DIAGNOSIS — N644 Mastodynia: Secondary | ICD-10-CM

## 2015-10-09 DIAGNOSIS — N926 Irregular menstruation, unspecified: Secondary | ICD-10-CM

## 2015-10-09 DIAGNOSIS — I10 Essential (primary) hypertension: Secondary | ICD-10-CM

## 2015-10-09 DIAGNOSIS — R5383 Other fatigue: Secondary | ICD-10-CM

## 2015-10-09 DIAGNOSIS — Z3202 Encounter for pregnancy test, result negative: Secondary | ICD-10-CM | POA: Diagnosis not present

## 2015-10-09 HISTORY — DX: Mastodynia: N64.4

## 2015-10-09 HISTORY — DX: Irregular menstruation, unspecified: N92.6

## 2015-10-09 LAB — POCT URINE PREGNANCY: Preg Test, Ur: NEGATIVE

## 2015-10-09 MED ORDER — HYDROCHLOROTHIAZIDE 12.5 MG PO CAPS: 12.5000 mg | ORAL_CAPSULE | Freq: Every day | ORAL | Status: DC

## 2015-10-09 MED ORDER — DEXLANSOPRAZOLE 60 MG PO CPDR: 60.0000 mg | DELAYED_RELEASE_CAPSULE | Freq: Every day | ORAL | Status: DC

## 2015-10-09 NOTE — Patient Instructions (Signed)
Get mammogram Follow up in 4 weeks

## 2015-10-09 NOTE — Progress Notes (Signed)
Subjective:     Patient ID: Kelsey Cooke, female   DOB: 18-Aug-1971, 45 y.o.   MRN: [id number]  HPI Kelsey Cooke is a 45 year old white female in complaining breast tenderness and reflux and missed period and fatigue.Her last period was in December and was light, husband had vasectomy.No hot flashes or night sweats.Some cramps.She took Protonix for 2 weeks but stopped, and wakes up with "cough and fluid running up throat".  Review of Systems Patient denies any headaches, hearing loss,  blurred vision, shortness of breath, chest pain, abdominal pain, problems with bowel movements, urination, or intercourse. No joint pain or mood swings.See HPI for positives. Reviewed past medical,surgical, social and family history. Reviewed medications and allergies.     Objective:   Physical Exam BP 130/90 mmHg  Pulse 70  Ht 5' 5.5" (1.664 m)  Wt 193 lb (87.544 kg)  BMI 31.62 kg/m2  LMP 08/14/2015 UPT negative,  Skin warm and dry,  Breasts:no dominate palpable mass, retraction or nipple discharge, has sebaceous cyst in left breast at about 10 o'clock an dis tender outer quadrant. Abdomen soft, non tender, nn HSM,Pelvic: external genitalia is normal in appearance no lesions, vagina: white discharge without odor,urethra has no lesions or masses noted, cervix:smooth and bulbous, uterus: normal size, shape and contour, mildly tender, no masses felt, adnexa: no masses or tenderness noted. Bladder is non tender and no masses felt.    Discussed could be getting ready to start, will check labs, go ahead and get mammogram is past due, and will try dexilant and go back on microzide for BP.Discussed could be peri menopause.    Assessment:      Missed period Reflux Breast tenderness Fatigue  Hypertension     Plan:    Get mammogram before next visit  Check CBC,CMP,TSH and FSH Rx microzide 12.5 mg #30 take 1 daily with 11 refills Rx dexilant 60 mg #30 take 1 daily with 1 refill Follow up in 4 weeks for physical and  ROS

## 2015-10-10 ENCOUNTER — Telehealth: Payer: Self-pay | Admitting: Adult Health

## 2015-10-10 LAB — COMPREHENSIVE METABOLIC PANEL
ALT: 15 IU/L (ref 0–32)
AST: 15 IU/L (ref 0–40)
Albumin/Globulin Ratio: 1.4 (ref 1.1–2.5)
Albumin: 4.4 g/dL (ref 3.5–5.5)
Alkaline Phosphatase: 91 IU/L (ref 39–117)
BUN/Creatinine Ratio: 12 (ref 9–23)
BUN: 8 mg/dL (ref 6–24)
Bilirubin Total: 0.4 mg/dL (ref 0.0–1.2)
CO2: 24 mmol/L (ref 18–29)
Calcium: 9.3 mg/dL (ref 8.7–10.2)
Chloride: 101 mmol/L (ref 96–106)
Creatinine, Ser: 0.67 mg/dL (ref 0.57–1.00)
GFR calc Af Amer: 123 mL/min/{1.73_m2} (ref >59)
GFR calc non Af Amer: 106 mL/min/{1.73_m2} (ref >59)
Globulin, Total: 3.1 g/dL (ref 1.5–4.5)
Glucose: 80 mg/dL (ref 65–99)
Potassium: 3.7 mmol/L (ref 3.5–5.2)
Sodium: 144 mmol/L (ref 134–144)
Total Protein: 7.5 g/dL (ref 6.0–8.5)

## 2015-10-10 LAB — CBC
Hematocrit: 41.3 % (ref 34.0–46.6)
Hemoglobin: 13.4 g/dL (ref 11.1–15.9)
MCH: 24.9 pg — ABNORMAL LOW (ref 26.6–33.0)
MCHC: 32.4 g/dL (ref 31.5–35.7)
MCV: 77 fL — ABNORMAL LOW (ref 79–97)
Platelets: 327 10*3/uL (ref 150–379)
RBC: 5.38 x10E6/uL — ABNORMAL HIGH (ref 3.77–5.28)
RDW: 14.6 % (ref 12.3–15.4)
WBC: 8.1 10*3/uL (ref 3.4–10.8)

## 2015-10-10 LAB — TSH: TSH: 1.84 u[IU]/mL (ref 0.450–4.500)

## 2015-10-10 LAB — FOLLICLE STIMULATING HORMONE: FSH: 5.9 m[IU]/mL

## 2015-10-10 NOTE — Telephone Encounter (Signed)
Pt aware of labs  

## 2015-11-06 ENCOUNTER — Other Ambulatory Visit: Payer: Self-pay | Admitting: Adult Health

## 2015-11-06 DIAGNOSIS — Z1231 Encounter for screening mammogram for malignant neoplasm of breast: Secondary | ICD-10-CM

## 2015-11-10 ENCOUNTER — Ambulatory Visit (HOSPITAL_COMMUNITY)
Admission: RE | Admit: 2015-11-10 | Discharge: 2015-11-10 | Disposition: A | Discharge status: Home / Self Care | Payer: BLUE CROSS/BLUE SHIELD | Source: Ambulatory Visit | Attending: Adult Health | Admitting: Adult Health

## 2015-11-10 DIAGNOSIS — Z1231 Encounter for screening mammogram for malignant neoplasm of breast: Secondary | ICD-10-CM | POA: Diagnosis present

## 2015-11-13 ENCOUNTER — Ambulatory Visit (INDEPENDENT_AMBULATORY_CARE_PROVIDER_SITE_OTHER): Payer: BLUE CROSS/BLUE SHIELD | Admitting: Adult Health

## 2015-11-13 ENCOUNTER — Encounter: Payer: Self-pay | Admitting: Adult Health

## 2015-11-13 VITALS — BP 138/84 | HR 72 | Temp 98.6°F | Ht 64.75 in | Wt 192.0 lb

## 2015-11-13 DIAGNOSIS — Z1212 Encounter for screening for malignant neoplasm of rectum: Secondary | ICD-10-CM | POA: Diagnosis not present

## 2015-11-13 DIAGNOSIS — Z01419 Encounter for gynecological examination (general) (routine) without abnormal findings: Secondary | ICD-10-CM | POA: Diagnosis not present

## 2015-11-13 DIAGNOSIS — I10 Essential (primary) hypertension: Secondary | ICD-10-CM

## 2015-11-13 DIAGNOSIS — H6690 Otitis media, unspecified, unspecified ear: Secondary | ICD-10-CM | POA: Insufficient documentation

## 2015-11-13 DIAGNOSIS — N816 Rectocele: Secondary | ICD-10-CM

## 2015-11-13 DIAGNOSIS — N920 Excessive and frequent menstruation with regular cycle: Secondary | ICD-10-CM

## 2015-11-13 HISTORY — DX: Otitis media, unspecified, unspecified ear: H66.90

## 2015-11-13 LAB — HEMOCCULT GUIAC POC 1CARD (OFFICE): Fecal Occult Blood, POC: NEGATIVE

## 2015-11-13 MED ORDER — AZITHROMYCIN 250 MG PO TABS: ORAL_TABLET | ORAL | Status: DC

## 2015-11-13 NOTE — Patient Instructions (Signed)
Physical and pap in 1 year  Mammogram yearly  

## 2015-11-13 NOTE — Progress Notes (Signed)
Patient ID: Kelsey Cooke, female   DOB: 1971-05-10, 45 y.o.   MRN: [id number] History of Present Illness: Kelsey Cooke is a 45 year old white female, married, G3P3 in for well woman gyn exam, had normal pap with negative HPV 11/08/13.She feels like may have sinus infection, had GI bug recently and kids have had URI.Has less energy in afternoons and evenings.Has moments may want meds for moods, but does not want to take pills every day.Periods heavy first 3 days with clots. PCP Dr Wolfgang Phoenix.   Current Medications, Allergies, Past Medical History, Past Surgical History, Family History and Social History were reviewed in [employer] record.     Review of Systems: Patient denies any daily headaches, hearing loss, blurred vision, shortness of breath, chest pain, abdominal pain, problems with bowel movements, urination, or intercourse. No joint pain or mood swings.See HPI for positives.    Physical Exam:BP 138/84 mmHg  Pulse 72  Temp(Src) 98.6 F (37 C)  Ht 5' 4.75" (1.645 m)  Wt 192 lb (87.091 kg)  BMI 32.18 kg/m2  LMP 11/09/2015 General:  Well developed, well nourished, no acute distress Skin:  Warm and dry Neck:  Midline trachea, normal thyroid, good ROM, no lymphadenopathy, right ear red and slightly swollen canal, left ear TM pearly gray with light reflex, no sinus tenderness and throat is not red  Lungs; Clear to auscultation bilaterally Breast:  No dominant palpable mass, retraction, or nipple discharge, has chronic sebaceous cyst at 9 o'clock left breast, had normal mammogram 11/10/15 Cardiovascular: Regular rate and rhythm Abdomen:  Soft, non tender, no hepatosplenomegaly Pelvic:  External genitalia is normal in appearance, no lesions.  The vagina is normal in appearance.Period like blood in vault. Urethra has no lesions or masses. The cervix is bulbous.  Uterus is felt to be normal size, shape, and contour.  No adnexal masses or tenderness noted.Bladder is non tender,  no masses felt. Rectal: Good sphincter tone, no polyps, or hemorrhoids felt.  Hemoccult negative.+rectocele Extremities/musculoskeletal:  No swelling or varicosities noted, no clubbing or cyanosis Psych:  No mood changes, alert and cooperative,seems happy Discussed ablation, IUD, POP and lexapro or ativan and she declines all today. Discussed dense breast and need to get mammogram yearly if wants MRI could get, but she declines.  Impression: Well woman gyn exam no pap Hypertension Ear infection Rectocele Menorrhagia    Plan: Rx Z pack Labs at work Physical and pap in 1 year Mammogram yearly  Continue microzide Call if changes mind and wants meds for periods or moods

## 2015-11-20 ENCOUNTER — Telehealth: Payer: Self-pay | Admitting: *Deleted

## 2015-11-20 MED ORDER — ESCITALOPRAM OXALATE 10 MG PO TABS: 10.0000 mg | ORAL_TABLET | Freq: Every day | ORAL | Status: DC

## 2015-11-20 NOTE — Telephone Encounter (Signed)
She wants to try lexapro, will rx lexapro 10 mg 1 daily

## 2015-12-01 ENCOUNTER — Other Ambulatory Visit: Payer: Self-pay | Admitting: Obstetrics & Gynecology

## 2015-12-01 MED ORDER — FLUCONAZOLE 150 MG PO TABS: 150.0000 mg | ORAL_TABLET | Freq: Once | ORAL | Status: DC

## 2016-01-05 ENCOUNTER — Encounter: Payer: Self-pay | Admitting: Family Medicine

## 2016-01-05 ENCOUNTER — Ambulatory Visit (INDEPENDENT_AMBULATORY_CARE_PROVIDER_SITE_OTHER): Payer: BLUE CROSS/BLUE SHIELD | Admitting: Family Medicine

## 2016-01-05 VITALS — BP 118/70 | Temp 98.6°F | Ht 64.5 in | Wt 193.0 lb

## 2016-01-05 DIAGNOSIS — H8110 Benign paroxysmal vertigo, unspecified ear: Secondary | ICD-10-CM | POA: Diagnosis not present

## 2016-01-05 MED ORDER — MECLIZINE HCL 25 MG PO TABS: 25.0000 mg | ORAL_TABLET | Freq: Three times a day (TID) | ORAL | Status: DC | PRN

## 2016-01-05 MED ORDER — ONDANSETRON 4 MG PO TBDP: 4.0000 mg | ORAL_TABLET | Freq: Four times a day (QID) | ORAL | Status: DC | PRN
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
## 2016-01-05 NOTE — Patient Instructions (Signed)
Inner ear dysfunction  Or vertigo

## 2016-01-28 ENCOUNTER — Ambulatory Visit (INDEPENDENT_AMBULATORY_CARE_PROVIDER_SITE_OTHER): Payer: BLUE CROSS/BLUE SHIELD | Admitting: Adult Health

## 2016-01-28 ENCOUNTER — Encounter: Payer: Self-pay | Admitting: Adult Health

## 2016-01-28 VITALS — BP 132/80 | HR 54 | Ht 64.5 in | Wt 194.0 lb

## 2016-01-28 DIAGNOSIS — N921 Excessive and frequent menstruation with irregular cycle: Secondary | ICD-10-CM | POA: Diagnosis not present

## 2016-01-28 DIAGNOSIS — R102 Pelvic and perineal pain: Secondary | ICD-10-CM | POA: Diagnosis not present

## 2016-01-28 HISTORY — DX: Pelvic and perineal pain: R10.2

## 2016-01-28 NOTE — Progress Notes (Signed)
Subjective:     Patient ID: Kelsey Cooke, female   DOB: 1971-08-23, 45 y.o.   MRN: [id number]  HPI Noah is a 45 year old white female, married, G3P3 in complaining of having 2 periods this month and they are lasting about 4 days and heavy the entire time, she changes pads and tampons every 1-2 hours and this last period could not get tampon in the first day felt, dry and swollen maybe.She also feels bloated and has pain just above mons pubis,  and it was hard for husband to insert penis last time had sex, fingers were fine. Denies any hot flashes, has had vertigo and saw Dr Wolfgang Phoenix and needs to follow up with him as it is persisting.   Review of Systems Patient denies any headaches, hearing loss, fatigue, blurred vision, shortness of breath, chest pain,  problems with bowel movements, urination, or intercourse. No joint pain or mood swings.See HPI for positives. Reviewed past medical,surgical, social and family history. Reviewed medications and allergies.     Objective:   Physical Exam BP 132/80 mmHg  Pulse 54  Ht 5' 4.5" (1.638 m)  Wt 194 lb (87.998 kg)  BMI 32.80 kg/m2  LMP 01/20/2016 Skin warm and dry.Pelvic: external genitalia is normal in appearance no lesions, vagina: pink with good moisture and rugae,urethra has no lesions or masses noted, cervix:smooth and bulbous, uterus: normal size, shape and contour,mildly tender, no masses felt, adnexa: no masses, some tenderness noted midline to right. Bladder is non tender and no masses felt.   Discussed will get [place] to evaluate, will discuss options further after that, but did mention IUD or ablation.  Face time 15 minutes with 50% counseling.  Assessment:     Menorrhagia  Pelvic pain     Plan:     Return in 1 week for gyn US,will talk when results back  Review handouts on ablation and menorrhagia and pelvic pain

## 2016-01-28 NOTE — Patient Instructions (Signed)
Pelvic Pain, Female Female pelvic pain can be caused by many different things and start from a variety of places. Pelvic pain refers to pain that is located in the lower half of the abdomen and between your hips. The pain may occur over a short period of time (acute) or may be reoccurring (chronic). The cause of pelvic pain may be related to disorders affecting the female reproductive organs (gynecologic), but it may also be related to the bladder, kidney stones, an intestinal complication, or muscle or skeletal problems. Getting help right away for pelvic pain is important, especially if there has been severe, sharp, or a sudden onset of unusual pain. It is also important to get help right away because some types of pelvic pain can be life threatening.  CAUSES  Below are only some of the causes of pelvic pain. The causes of pelvic pain can be in one of several categories.   Gynecologic.  Pelvic inflammatory disease.  Sexually transmitted infection.  Ovarian cyst or a twisted ovarian ligament (ovarian torsion).  Uterine lining that grows outside the uterus (endometriosis).  Fibroids, cysts, or tumors.  Ovulation.  Pregnancy.  Pregnancy that occurs outside the uterus (ectopic pregnancy).  Miscarriage.  Labor.  Abruption of the placenta or ruptured uterus.  Infection.  Uterine infection (endometritis).  Bladder infection.  Diverticulitis.  Miscarriage related to a uterine infection (septic abortion).  Bladder.  Inflammation of the bladder (cystitis).  Kidney stone(s).  Gastrointestinal.  Constipation.  Diverticulitis.  Neurologic.  Trauma.  Feeling pelvic pain because of mental or emotional causes (psychosomatic).  Cancers of the bowel or pelvis. EVALUATION  Your caregiver will want to take a careful history of your concerns. This includes recent changes in your health, a careful gynecologic history of your periods (menses), and a sexual history. Obtaining  your family history and medical history is also important. Your caregiver may suggest a pelvic exam. A pelvic exam will help identify the location and severity of the pain. It also helps in the evaluation of which organ system may be involved. In order to identify the cause of the pelvic pain and be properly treated, your caregiver may order tests. These tests may include:   A pregnancy test.  Pelvic ultrasonography.  An X-ray exam of the abdomen.  A urinalysis or evaluation of vaginal discharge.  Blood tests. HOME CARE INSTRUCTIONS   Only take over-the-counter or prescription medicines for pain, discomfort, or fever as directed by your caregiver.   Rest as directed by your caregiver.   Eat a balanced diet.   Drink enough fluids to make your urine clear or pale yellow, or as directed.   Avoid sexual intercourse if it causes pain.   Apply warm or cold compresses to the lower abdomen depending on which one helps the pain.   Avoid stressful situations.   Keep a journal of your pelvic pain. Write down when it started, where the pain is located, and if there are things that seem to be associated with the pain, such as food or your menstrual cycle.  Follow up with your caregiver as directed.  SEEK MEDICAL CARE IF:  Your medicine does not help your pain.  You have abnormal vaginal discharge. SEEK IMMEDIATE MEDICAL CARE IF:   You have heavy bleeding from the vagina.   Your pelvic pain increases.   You feel light-headed or faint.   You have chills.   You have pain with urination or blood in your urine.   You have uncontrolled  diarrhea or vomiting.   You have a fever or persistent symptoms for more than 3 days.  You have a fever and your symptoms suddenly get worse.   You are being physically or sexually abused.   This information is not intended to replace advice given to you by your health care provider. Make sure you discuss any questions you have with  your health care provider.   Document Released: 07/13/2004 Document Revised: 05/07/2015 Document Reviewed: 12/06/2011 Elsevier Interactive Patient Education 2016 [employer]. Menorrhagia Menorrhagia is the medical term for when your menstrual periods are heavy or last longer than usual. With menorrhagia, every period you have may cause enough blood loss and cramping that you are unable to maintain your usual activities. CAUSES  In some cases, the cause of heavy periods is unknown, but a number of conditions may cause menorrhagia. Common causes include:  A problem with the hormone-producing thyroid gland (hypothyroid).  Noncancerous growths in the uterus (polyps or fibroids).  An imbalance of the estrogen and progesterone hormones.  One of your ovaries not releasing an egg during one or more months.  Side effects of having an intrauterine device (IUD).  Side effects of some medicines, such as anti-inflammatory medicines or blood thinners.  A bleeding disorder that stops your blood from clotting normally. SIGNS AND SYMPTOMS  During a normal period, bleeding lasts between 4 and 8 days. Signs that your periods are too heavy include:  You routinely have to change your pad or tampon every 1 or 2 hours because it is completely soaked.  You pass blood clots larger than 1 inch (2.5 cm) in size.  You have bleeding for more than 7 days.  You need to use pads and tampons at the same time because of heavy bleeding.  You need to wake up to change your pads or tampons during the night.  You have symptoms of anemia, such as tiredness, fatigue, or shortness of breath. DIAGNOSIS  Your health care provider will perform a physical exam and ask you questions about your symptoms and menstrual history. Other tests may be ordered based on what the health care provider finds during the exam. These tests can include:  Blood tests. Blood tests are used to check if you are pregnant or have hormonal  changes, a bleeding or thyroid disorder, low iron levels (anemia), or other problems.  Endometrial biopsy. Your health care provider takes a sample of tissue from the inside of your uterus to be examined under a microscope.  Pelvic ultrasound. This test uses sound waves to make a picture of your uterus, ovaries, and vagina. The pictures can show if you have fibroids or other growths.  Hysteroscopy. For this test, your health care provider will use a small telescope to look inside your uterus. Based on the results of your initial tests, your health care provider may recommend further testing. TREATMENT  Treatment may not be needed. If it is needed, your health care provider may recommend treatment with one or more medicines first. If these do not reduce bleeding enough, a surgical treatment might be an option. The best treatment for you will depend on:   Whether you need to prevent pregnancy.  Your desire to have children in the future.  The cause and severity of your bleeding.  Your opinion and personal preference.  Medicines for menorrhagia may include:  Birth control methods that use hormones. These include the pill, skin patch, vaginal ring, shots that you get every 3 months, hormonal IUD,  and implant. These treatments reduce bleeding during your menstrual period.  Medicines that thicken blood and slow bleeding.  Medicines that reduce swelling, such as ibuprofen.  Medicines that contain a synthetic hormone called progestin.   Medicines that make the ovaries stop working for a short time.  You may need surgical treatment for menorrhagia if the medicines are unsuccessful. Treatment options include:  Dilation and curettage (D&C). In this procedure, your health care provider opens (dilates) your cervix and then scrapes or suctions tissue from the lining of your uterus to reduce menstrual bleeding.  Operative hysteroscopy. This procedure uses a tiny tube with a light  (hysteroscope) to view your uterine cavity and can help in the surgical removal of a polyp that may be causing heavy periods.  Endometrial ablation. Through various techniques, your health care provider permanently destroys the entire lining of your uterus (endometrium). After endometrial ablation, most women have little or no menstrual flow. Endometrial ablation reduces your ability to become pregnant.  Endometrial resection. This surgical procedure uses an electrosurgical wire loop to remove the lining of the uterus. This procedure also reduces your ability to become pregnant.  Hysterectomy. Surgical removal of the uterus and cervix is a permanent procedure that stops menstrual periods. Pregnancy is not possible after a hysterectomy. This procedure requires anesthesia and hospitalization. HOME CARE INSTRUCTIONS   Only take over-the-counter or prescription medicines as directed by your health care provider. Take prescribed medicines exactly as directed. Do not change or switch medicines without consulting your health care provider.  Take any prescribed iron pills exactly as directed by your health care provider. Long-term heavy bleeding may result in low iron levels. Iron pills help replace the iron your body lost from heavy bleeding. Iron may cause constipation. If this becomes a problem, increase the bran, fruits, and roughage in your diet.  Do not take aspirin or medicines that contain aspirin 1 week before or during your menstrual period. Aspirin may make the bleeding worse.  If you need to change your sanitary pad or tampon more than once every 2 hours, stay in bed and rest as much as possible until the bleeding stops.  Eat well-balanced meals. Eat foods high in iron. Examples are leafy green vegetables, meat, liver, eggs, and whole grain breads and cereals. Do not try to lose weight until the abnormal bleeding has stopped and your blood iron level is back to normal. SEEK MEDICAL CARE IF:    You soak through a pad or tampon every 1 or 2 hours, and this happens every time you have a period.  You need to use pads and tampons at the same time because you are bleeding so much.  You need to change your pad or tampon during the night.  You have a period that lasts for more than 8 days.  You pass clots bigger than 1 inch wide.  You have irregular periods that happen more or less often than once a month.  You feel dizzy or faint.  You feel very weak or tired.  You feel short of breath or feel your heart is beating too fast when you exercise.  You have nausea and vomiting or diarrhea while you are taking your medicine.  You have any problems that may be related to the medicine you are taking. SEEK IMMEDIATE MEDICAL CARE IF:   You soak through 4 or more pads or tampons in 2 hours.  You have any bleeding while you are pregnant. MAKE SURE YOU:   Understand  these instructions.  Will watch your condition.  Will get help right away if you are not doing well or get worse.   This information is not intended to replace advice given to you by your health care provider. Make sure you discuss any questions you have with your health care provider.   Document Released: 08/16/2005 Document Revised: 08/21/2013 Document Reviewed: 02/04/2013 Elsevier Interactive Patient Education [employer]. Return in 1 week for [place]

## 2016-02-03 ENCOUNTER — Other Ambulatory Visit: Payer: BLUE CROSS/BLUE SHIELD

## 2016-02-05 ENCOUNTER — Ambulatory Visit (INDEPENDENT_AMBULATORY_CARE_PROVIDER_SITE_OTHER): Payer: BLUE CROSS/BLUE SHIELD

## 2016-02-05 DIAGNOSIS — N921 Excessive and frequent menstruation with irregular cycle: Secondary | ICD-10-CM

## 2016-02-05 DIAGNOSIS — R102 Pelvic and perineal pain: Secondary | ICD-10-CM

## 2016-02-05 DIAGNOSIS — D252 Subserosal leiomyoma of uterus: Secondary | ICD-10-CM | POA: Diagnosis not present

## 2016-02-05 DIAGNOSIS — N854 Malposition of uterus: Secondary | ICD-10-CM

## 2016-02-05 NOTE — Progress Notes (Signed)
PELVIC US TA/TV: heterogenous anteverted uterus w/ a subserosal fundal fibroid 4.2 x 3.6 x 3.7 cm,EEC 12.2 mm,normal ov's bilat (mobile),no free fluid,no pain during ultrasound.

## 2016-02-06 ENCOUNTER — Encounter: Payer: Self-pay | Admitting: Adult Health

## 2016-02-06 ENCOUNTER — Telehealth: Payer: Self-pay | Admitting: Adult Health

## 2016-02-06 DIAGNOSIS — D252 Subserosal leiomyoma of uterus: Secondary | ICD-10-CM

## 2016-02-06 DIAGNOSIS — D219 Benign neoplasm of connective and other soft tissue, unspecified: Secondary | ICD-10-CM

## 2016-02-06 HISTORY — DX: Benign neoplasm of connective and other soft tissue, unspecified: D21.9

## 2016-02-06 NOTE — Telephone Encounter (Signed)
Left message that US showed fundal fibroid, and that you are candidate for ablation, just let me know

## 2016-03-18 ENCOUNTER — Ambulatory Visit (INDEPENDENT_AMBULATORY_CARE_PROVIDER_SITE_OTHER): Payer: BLUE CROSS/BLUE SHIELD | Admitting: Obstetrics and Gynecology

## 2016-03-18 ENCOUNTER — Other Ambulatory Visit: Payer: Self-pay | Admitting: Obstetrics and Gynecology

## 2016-03-18 ENCOUNTER — Encounter: Payer: Self-pay | Admitting: Obstetrics and Gynecology

## 2016-03-18 VITALS — BP 126/80 | Ht 64.0 in | Wt 195.0 lb

## 2016-03-18 DIAGNOSIS — N921 Excessive and frequent menstruation with irregular cycle: Secondary | ICD-10-CM | POA: Diagnosis not present

## 2016-03-18 DIAGNOSIS — Z01818 Encounter for other preprocedural examination: Secondary | ICD-10-CM

## 2016-03-18 DIAGNOSIS — N859 Noninflammatory disorder of uterus, unspecified: Secondary | ICD-10-CM | POA: Diagnosis not present

## 2016-03-18 NOTE — Progress Notes (Signed)
Patient ID: Kelsey Cooke, female   DOB: Oct 21, 1970, 45 y.o.   MRN: [id number] Pt here today for pre op visit. Pt states that she would like to discuss the ablation.

## 2016-03-19 NOTE — Progress Notes (Signed)
Subjective:    Patient ID: Kelsey Cooke, female    DOB: 12/14/1970, 45 y.o.   MRN: [id number]  HPI Kelsey Cooke is here in follow-up of prior visit were she discussed endometrial ablation with Kelsey Cooke and received instructional information. She also had a prior visit here in May complaining of vaginal discomfort and dyspareunia due to vaginal dryness. This point she is adjusted to the vaginal dryness consider some of it due to infrequency of intimacy and lack of foreplay. I recommended lubricants and patient given names of commonly useful lubricants At this point focuses on getting rid of the heavy periods which last 5-7 days. She's had an ultrasound GYNECOLOGIC SONOGRAM   Kelsey Cooke is a 45 y.o. G3P3 LMP 01/23/2016 for a pelvic sonogram for menorrhagia w/irregular cycles.  Uterus 12.2 x 6.2 x 4.7 cm, heterogenous anteverted uterus w/ a subserosal fundal fibroid 4.2 x 3.6 x 3.7 cm  Endometrium 12.2 mm, symmetrical, wnl  Right ovary 4 x 2.2 x 1.6 cm, wnl  Left ovary 2.3 x 1.6 x 2.6 cm, wnl  No pain during ultrasound,no free fluid.  Technician Comments:   PELVIC US TA/TV: heterogenous anteverted uterus w/ a subserosal fundal fibroid 4.2 x 3.6 x 3.7 cm,EEC 12.2 mm,normal ov's bilat (mobile),no free fluid,no pain during ultrasound.   Kelsey Cooke 02/05/2016 4:54 PM  Clinical Impression and recommendations:  I have reviewed the sonogram results above.  Combined with the patient's current clinical course, below are my impressions and any appropriate recommendations for management based on the sonographic findings:  1. Symmetric endometrium with trilaminar sonolucent endometrial stripe, not deformed by the subserosal fibroid. 2. Normal maternal adnexal structures. 3. May consider medical management options, including IUD.  [name] Patient was inclined to head toward endometrial ablation rather than  IUD  Review of Systems     Objective:   Physical Exam  Constitutional: She is oriented to person, place, and time. She appears well-developed and well-nourished.  HENT:  Head: Normocephalic.  Eyes: Pupils are equal, round, and reactive to light.  Neck: Normal range of motion.  Cardiovascular: Normal rate.   Pulmonary/Chest: Effort normal.  Abdominal: Soft.  Genitourinary: Vagina normal.  Uterus well supported no major cystocele or rectocele  Musculoskeletal: Normal range of motion.  Neurological: She is oriented to person, place, and time.  Skin: Skin is warm and dry.  Psychiatric: She has a normal mood and affect. Her behavior is normal. Judgment and thought content normal.    Endometrial biopsy is performed  Endometrial Biopsy: Patient given informed consent, signed copy in the chart, time out was performed. Time out taken. The patient was placed in the lithotomy position and the cervix brought into view with sterile speculum.  Portio of cervix cleansed x 2 with betadine swabs.  A tenaculum was placed in the anterior lip of the cervix. The uterus was sounded for depth of 8 cm,. Milex uterine Explora 3 mm was introduced to into the uterus, suction created,  and an endometrial sample was obtained. All equipment was removed and accounted for.   The patient tolerated the procedure well.    Patient given post procedure instructions.  Followup: We'll review results and proceed toward endometrial biopsy after her next period. Menstrual period is due next week       Assessment & Plan:  Menorrhagia with heavy periods, with desiring endometrial ablation Plan schedule endometrial ablation after next mensesEndometrial Biopsy: Patient given informed consent, signed copy in the chart, time out was performed.  Time out taken. The patient was placed in the lithotomy position and the cervix brought into view with sterile speculum.  Portio of cervix cleansed x 2 with betadine swabs.  A  tenaculum was placed in the anterior lip of the cervix. The uterus was sounded for depth of 8 cm,. Milex uterine Explora 3 mm was introduced to into the uterus, suction created,  and an endometrial sample was obtained. All equipment was removed and accounted for.   The patient tolerated the procedure well.    Patient given post procedure instructions.  Followup: By phone and then schedule surgery

## 2016-03-30 ENCOUNTER — Telehealth: Payer: Self-pay | Admitting: Obstetrics and Gynecology

## 2016-03-30 NOTE — Telephone Encounter (Signed)
This message for Kelsey Cooke that the pathology shows benign proliferative endometrium, and that she may be a candidate for the endometrial ablation

## 2016-08-27 ENCOUNTER — Encounter (HOSPITAL_COMMUNITY): Payer: Self-pay

## 2016-08-27 ENCOUNTER — Emergency Department (HOSPITAL_COMMUNITY)
Admission: EM | Admit: 2016-08-27 | Discharge: 2016-08-27 | Disposition: A | Discharge status: Home / Self Care | Payer: BLUE CROSS/BLUE SHIELD | Attending: Emergency Medicine | Admitting: Emergency Medicine

## 2016-08-27 ENCOUNTER — Emergency Department (HOSPITAL_COMMUNITY): Payer: BLUE CROSS/BLUE SHIELD

## 2016-08-27 DIAGNOSIS — Y939 Activity, unspecified: Secondary | ICD-10-CM | POA: Diagnosis not present

## 2016-08-27 DIAGNOSIS — S4991XA Unspecified injury of right shoulder and upper arm, initial encounter: Secondary | ICD-10-CM | POA: Diagnosis not present

## 2016-08-27 DIAGNOSIS — I10 Essential (primary) hypertension: Secondary | ICD-10-CM | POA: Insufficient documentation

## 2016-08-27 DIAGNOSIS — M25511 Pain in right shoulder: Secondary | ICD-10-CM | POA: Diagnosis not present

## 2016-08-27 DIAGNOSIS — S0990XA Unspecified injury of head, initial encounter: Secondary | ICD-10-CM | POA: Diagnosis not present

## 2016-08-27 DIAGNOSIS — M542 Cervicalgia: Secondary | ICD-10-CM | POA: Diagnosis not present

## 2016-08-27 DIAGNOSIS — Y92410 Unspecified street and highway as the place of occurrence of the external cause: Secondary | ICD-10-CM | POA: Insufficient documentation

## 2016-08-27 DIAGNOSIS — M545 Low back pain: Secondary | ICD-10-CM | POA: Diagnosis not present

## 2016-08-27 DIAGNOSIS — Y999 Unspecified external cause status: Secondary | ICD-10-CM | POA: Diagnosis not present

## 2016-08-27 DIAGNOSIS — Z79899 Other long term (current) drug therapy: Secondary | ICD-10-CM | POA: Insufficient documentation

## 2016-08-27 DIAGNOSIS — S199XXA Unspecified injury of neck, initial encounter: Secondary | ICD-10-CM | POA: Diagnosis not present

## 2016-08-27 DIAGNOSIS — Z87891 Personal history of nicotine dependence: Secondary | ICD-10-CM | POA: Diagnosis not present

## 2016-08-27 DIAGNOSIS — S161XXA Strain of muscle, fascia and tendon at neck level, initial encounter: Secondary | ICD-10-CM | POA: Diagnosis not present

## 2016-08-27 MED ORDER — CYCLOBENZAPRINE HCL 10 MG PO TABS
10.0000 mg | ORAL_TABLET | Freq: Once | ORAL | Status: AC
  Administered 2016-08-27: 10 mg via ORAL
  Filled 2016-08-27: qty 1

## 2016-08-27 MED ORDER — HYDROCODONE-ACETAMINOPHEN 5-325 MG PO TABS: ORAL_TABLET | ORAL | 0 refills | Status: DC

## 2016-08-27 MED ORDER — IBUPROFEN 800 MG PO TABS
800.0000 mg | ORAL_TABLET | Freq: Once | ORAL | Status: AC
  Administered 2016-08-27: 800 mg via ORAL
  Filled 2016-08-27: qty 1

## 2016-08-27 MED ORDER — IBUPROFEN 800 MG PO TABS: 800.0000 mg | ORAL_TABLET | Freq: Three times a day (TID) | ORAL | 0 refills | Status: DC

## 2016-08-27 MED ORDER — CYCLOBENZAPRINE HCL 10 MG PO TABS: 10.0000 mg | ORAL_TABLET | Freq: Three times a day (TID) | ORAL | 0 refills | Status: DC | PRN

## 2016-08-27 NOTE — Discharge Instructions (Signed)
Apply ice packs on/;off to your head, neck and back.  Follow-up with your doctor for recheck.  Return to ER for any worsening symptoms
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
## 2016-08-31 ENCOUNTER — Encounter: Payer: Self-pay | Admitting: Family Medicine

## 2016-08-31 ENCOUNTER — Ambulatory Visit (INDEPENDENT_AMBULATORY_CARE_PROVIDER_SITE_OTHER): Payer: BLUE CROSS/BLUE SHIELD | Admitting: Family Medicine

## 2016-08-31 VITALS — BP 122/82 | Ht 64.0 in | Wt 199.8 lb

## 2016-08-31 DIAGNOSIS — S161XXA Strain of muscle, fascia and tendon at neck level, initial encounter: Secondary | ICD-10-CM

## 2016-08-31 DIAGNOSIS — E041 Nontoxic single thyroid nodule: Secondary | ICD-10-CM | POA: Diagnosis not present

## 2016-08-31 DIAGNOSIS — M25511 Pain in right shoulder: Secondary | ICD-10-CM

## 2016-08-31 MED ORDER — HYDROCODONE-ACETAMINOPHEN 5-325 MG PO TABS
ORAL_TABLET | ORAL | 0 refills | Status: DC
Start: 2016-08-31 — End: 2016-09-06
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
## 2016-09-06 ENCOUNTER — Encounter: Payer: Self-pay | Admitting: Family Medicine

## 2016-09-06 ENCOUNTER — Ambulatory Visit (INDEPENDENT_AMBULATORY_CARE_PROVIDER_SITE_OTHER): Payer: BLUE CROSS/BLUE SHIELD | Admitting: Family Medicine

## 2016-09-06 VITALS — BP 142/90 | Ht 64.0 in | Wt 199.2 lb

## 2016-09-06 DIAGNOSIS — S161XXA Strain of muscle, fascia and tendon at neck level, initial encounter: Secondary | ICD-10-CM

## 2016-09-06 DIAGNOSIS — M542 Cervicalgia: Secondary | ICD-10-CM | POA: Diagnosis not present

## 2016-09-06 DIAGNOSIS — M25511 Pain in right shoulder: Secondary | ICD-10-CM | POA: Diagnosis not present

## 2016-09-06 MED ORDER — HYDROCODONE-ACETAMINOPHEN 5-325 MG PO TABS: ORAL_TABLET | ORAL | 0 refills | Status: DC

## 2016-09-06 MED ORDER — CHLORZOXAZONE 500 MG PO TABS
500.0000 mg | ORAL_TABLET | Freq: Three times a day (TID) | ORAL | 0 refills | Status: DC | PRN
Start: 2016-09-06 — End: 2017-01-21
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
## 2016-09-07 ENCOUNTER — Ambulatory Visit (HOSPITAL_COMMUNITY)
Admission: RE | Admit: 2016-09-07 | Discharge: 2016-09-07 | Disposition: A | Discharge status: Home / Self Care | Payer: BLUE CROSS/BLUE SHIELD | Source: Ambulatory Visit | Attending: Family Medicine | Admitting: Family Medicine

## 2016-09-07 ENCOUNTER — Telehealth: Payer: Self-pay | Admitting: Family Medicine

## 2016-09-07 DIAGNOSIS — E041 Nontoxic single thyroid nodule: Secondary | ICD-10-CM | POA: Diagnosis not present

## 2016-09-07 MED ORDER — IBUPROFEN 800 MG PO TABS: 800.0000 mg | ORAL_TABLET | Freq: Three times a day (TID) | ORAL | 1 refills | Status: DC

## 2016-09-07 NOTE — Telephone Encounter (Signed)
Sure write for [facility] ref

## 2016-09-07 NOTE — Telephone Encounter (Signed)
Patient seen Dr. Richardson Landry yesterday and she said she was going to have an Rx for ibuprofen called in, but pharmacy doesn't have anything.  Please advise.  [facility]

## 2016-09-07 NOTE — Telephone Encounter (Signed)
You saw this patient yesterday. Pt seen at [facility] ED (08/27/16) by Kem Parkinson.  She was rx'd IB 800 mg TID PRN #[id number]. Would you like to send IB rx?

## 2016-09-07 NOTE — Telephone Encounter (Signed)
Rx sent to pharmacy   

## 2016-09-08 ENCOUNTER — Telehealth: Payer: Self-pay | Admitting: Family Medicine

## 2016-09-08 NOTE — Telephone Encounter (Signed)
I advised patient Kelsey Cooke not reviewed by Dr. Richardson Landry at this point and he is in clinic right now.  I advised we would call with results after his review. She voiced understanding and agreed with plan.

## 2016-09-08 NOTE — Telephone Encounter (Signed)
Patient would like results of ultrasound done yesterday

## 2016-09-09 DIAGNOSIS — Z029 Encounter for administrative examinations, unspecified: Secondary | ICD-10-CM

## 2016-09-10 ENCOUNTER — Ambulatory Visit (HOSPITAL_COMMUNITY): Payer: BLUE CROSS/BLUE SHIELD | Attending: Family Medicine | Admitting: Physical Therapy

## 2016-09-10 DIAGNOSIS — M542 Cervicalgia: Secondary | ICD-10-CM | POA: Diagnosis not present

## 2016-09-10 DIAGNOSIS — M6281 Muscle weakness (generalized): Secondary | ICD-10-CM | POA: Diagnosis not present

## 2016-09-10 NOTE — Patient Instructions (Addendum)
Strengthening: Lateral Bend - Isometric (in Neutral)    Using light pressure from fingertips, press into right temple. Resist bending head sideways. Hold __3__ seconds. Repeat __10__ times per set. Do __1__ sets per session. Do _3___ sessions per day.  http://orth.exer.us/302   Copyright  VHI. All rights reserved.  Strengthening: Shoulder Shrug (Phase 1)    Shrug shoulders up backward now relax  Repeat _10___ times per set. Do __1__ sets per session. Do __3__ sessions per day. 2 http://orth.exer.us/336   Copyright  VHI. All rights reserved.  Scapular Retraction (Standing)    With arms at sides, pinch shoulder blades together. Repeat _10___ times per set. Do _1___ sets per session. Do __23_ sessions per day.  http://orth.exer.us/944   Copyright  VHI. All rights reserved.  AROM: Lateral Neck Flexion    Slowly tilt head toward one shoulder, then the other. Hold each position _3___ seconds. Repeat _5-10___ times per set. Do __1__ sets per session. Do ___3_ sessions per day.  http://orth.exer.us/296   Copyright  VHI. All rights reserved.
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
## 2016-09-14 ENCOUNTER — Ambulatory Visit (HOSPITAL_COMMUNITY): Payer: BLUE CROSS/BLUE SHIELD | Admitting: Physical Therapy

## 2016-09-14 DIAGNOSIS — M6281 Muscle weakness (generalized): Secondary | ICD-10-CM | POA: Diagnosis not present

## 2016-09-14 DIAGNOSIS — M542 Cervicalgia: Secondary | ICD-10-CM | POA: Diagnosis not present

## 2016-09-14 NOTE — Patient Instructions (Addendum)
Flexibility: Neck Retraction    Pull head straight back, keeping eyes and jaw level. Repeat __10__ times per set. Do ___1_ sets per session. Do _2___ sessions per day.  http://orth.exer.us/344   Copyright  VHI. All rights reserved.  Scapular Retraction (Standing)    With arms at sides, pinch shoulder blades together. Repeat _10___ times per set. Do __1__ sets per session. Do __3__ sessions per day.  http://orth.exer.us/944   Copyright  VHI. All rights reserved.  Scapular Retraction: Elbow Flexion (Standing)    With elbows bent to 90, pinch shoulder blades together and rotate arms out, keeping elbows bent. Repeat 10____ times per set. Do __1__ sets per session. Do ____2 sessions per day.  http://orth.exer.us/948   Copyright  VHI. All rights reserved.

## 2016-09-14 NOTE — Therapy (Signed)
[facility] [place], [place], [address] Phone: [phone]   Fax:  [phone]  Physical Therapy Treatment  Patient Details  Name: Batul Menting MRN: [id number] Date of Birth: 1970/10/14 Referring Provider: Sallee Lange   Encounter Date: 09/14/2016      PT End of Session - 09/14/16 1518    Visit Number 2   Number of Visits 8   Date for PT Re-Evaluation 10/10/16   Authorization Type BCBS   Authorization - Visit Number 2   Authorization - Number of Visits 8   PT Start Time [id number]   PT Stop Time 1512   PT Time Calculation (min) 39 min   Behavior During Therapy [facility] for tasks assessed/performed      Past Medical History:  Diagnosis Date  . Allergic rhinitis   . Anxiety   . Breast tenderness in female 10/09/2015  . Cutaneous skin tags 03/27/2013   Had 2 minute skin tags on upper abdomen near breast removed  . Ear infection 11/13/2015  . Elevated BP 11/12/2014  . Fatigue 11/12/2014  . Fibroid 02/06/2016  . GERD (gastroesophageal reflux disease)   . Headache 01/08/2015  . Hemorrhoids 11/12/2014  . Hypertension   . Menorrhagia 11/08/2013  . Missed period 10/09/2015  . Pelvic pain in female 01/28/2016  . Rectocele 11/12/2014  . Sebaceous cyst of breast   . Sinus infection 11/08/2013    Past Surgical History:  Procedure Laterality Date  . GUM SURGERY    . VARICOSE VEIN SURGERY    . warts      There were no vitals filed for this visit.      Subjective Assessment - 09/14/16 1437    Subjective Pt states that her Headaches were better after last treatment.   Pt states that she overdid over the last couple of days. She had a perm yesterday and was doing more housework.    Pertinent History HTN,    How long can you sit comfortably? Able to sit for about 15 to 20 minutes    How long can you stand comfortably? 15-20 minutes    How long can you walk comfortably? walks for about 10 minutes at a time.     Patient Stated Goals less  headaches, to be able to move her arms better, sit longer, take less pain medication, sleep without pain medication .   Currently in Pain? Yes   Pain Score 5    Pain Location Neck   Pain Onset 1 to 4 weeks ago                         [facility] Adult PT Treatment/Exercise - 09/14/16 0001      Posture/Postural Control   Posture/Postural Control No significant limitations     Exercises   Exercises Neck     Neck Exercises: Seated   Cervical Isometrics Right lateral flexion;Left lateral flexion;10 reps   Neck Retraction 10 reps   Lateral Flexion Both;5 reps   W Back 10 reps   Shoulder Shrugs 5 reps   Shoulder Rolls Backwards;10 reps     Neck Exercises: Supine   Neck Retraction 10 reps   Neck Retraction Limitations scapular retraction x 10    Shoulder Flexion Both;10 reps   Shoulder Flexion Limitations 90 degrees      Manual Therapy   Manual Therapy Soft tissue mobilization   Manual therapy comments done seperate from all other activity.  Soft tissue mobilization to Bilateral trapezius mm.  Done both sitting and supine.                 PT Education - 09/14/16 1514    Education provided Yes   Education Details additional HEP   Person(s) Educated Patient   Methods Explanation;Handout   Comprehension Returned demonstration;Verbalized understanding          PT Short Term Goals - 09/14/16 1521      PT SHORT TERM GOAL #1   Title Pt cervical rotation to be improved 10 degrees bilaterally to allow safer driving.   Time 2   Period Weeks   Status On-going     PT SHORT TERM GOAL #2   Title Pt cervical strength to be at least a 4/5 to allow pt to sit for an hour without increased pain or headaches.    Time 2   Period Weeks   Status On-going     PT SHORT TERM GOAL #3   Title Pt pain to be no greater than a 4/10 to allow pt to complete 20 minutes of light housework   Time 2   Period Weeks   Status On-going           PT Long Term Goals -  09/14/16 1521      PT LONG TERM GOAL #1   Title Pt to have no headaches to be confident returning to work.   Time 4   Period Weeks   Status On-going     PT LONG TERM GOAL #2   Title Pt pain to be no greater than a 2/10 to allow pt to sleep without using medication.    Time 4   Period Weeks   Status On-going     PT LONG TERM GOAL #3   Title Pt cervical strength to be back to a 5/5 to allow pt to be up all day without increased neck pain.    Time 4   Period Weeks   Status On-going     PT LONG TERM GOAL #4   Title Pt to be able to complete her housework without having to take a break.    Time 4   Period Weeks   Status On-going               Plan - 09/14/16 1518    Clinical Impression Statement Added additional exercises to program focusing on stabilization of cervical area.  Mild mm spasms palpatable during manual.  Pt with decreased headache since completing her exercises.     Rehab Potential Good   PT Frequency 3x / week   PT Duration 4 weeks   PT Treatment/Interventions ADLs/Self Care Home Management;Therapeutic activities;Therapeutic exercise;Patient/family education;Manual techniques   PT Next Visit Plan  keep cervical area stabilized with  x to V, backward UBE, ROM in painfree area.. Begin postrual t-band exercises with cervical stabilization.     PT Home Exercise Plan SB isometric, shoulder shrugs; 1-16: scapular and cervical retraction, W back       Patient will benefit from skilled therapeutic intervention in order to improve the following deficits and impairments:  Decreased activity tolerance, Decreased strength, Decreased range of motion, Pain  Visit Diagnosis: Cervicalgia  Muscle weakness (generalized)     Problem List Patient Active Problem List   Diagnosis Date Noted  . Fibroid 02/06/2016  . Pelvic pain in female 01/28/2016  . Ear infection 11/13/2015  . Breast tenderness in female 10/09/2015  . Missed period  10/09/2015  . Hypertension  10/09/2015  . Esophageal reflux 07/21/2015  . Headache 01/08/2015  . Fatigue 11/12/2014  . Rectocele 11/12/2014  . Hemorrhoids 11/12/2014  . Elevated BP 11/12/2014  . Menorrhagia 11/08/2013  . Sinus infection 11/08/2013  . Depression 08/07/2013  . Sebaceous cyst of breast 03/27/2013  . Cutaneous skin tags 03/27/2013    Rayetta Humphrey, PT CLT [phone] 09/14/2016, 3:22 PM  [facility] [address] [place], [place], [address] Phone: [phone]   Fax:  [phone]  Name: Farhiya Vannatter MRN: [id number] Date of Birth: July 11, 1971

## 2016-09-15 ENCOUNTER — Ambulatory Visit: Payer: BLUE CROSS/BLUE SHIELD | Admitting: Family Medicine

## 2016-09-16 ENCOUNTER — Encounter (HOSPITAL_COMMUNITY): Payer: BLUE CROSS/BLUE SHIELD | Admitting: Physical Therapy

## 2016-09-17 ENCOUNTER — Ambulatory Visit (INDEPENDENT_AMBULATORY_CARE_PROVIDER_SITE_OTHER): Payer: BLUE CROSS/BLUE SHIELD | Admitting: Family Medicine

## 2016-09-17 ENCOUNTER — Ambulatory Visit (HOSPITAL_COMMUNITY): Payer: BLUE CROSS/BLUE SHIELD

## 2016-09-17 ENCOUNTER — Encounter: Payer: Self-pay | Admitting: Family Medicine

## 2016-09-17 VITALS — BP 118/74 | Ht 64.0 in | Wt 198.0 lb

## 2016-09-17 DIAGNOSIS — M6281 Muscle weakness (generalized): Secondary | ICD-10-CM | POA: Diagnosis not present

## 2016-09-17 DIAGNOSIS — S161XXA Strain of muscle, fascia and tendon at neck level, initial encounter: Secondary | ICD-10-CM | POA: Diagnosis not present

## 2016-09-17 DIAGNOSIS — M542 Cervicalgia: Secondary | ICD-10-CM

## 2016-09-17 MED ORDER — SCOPOLAMINE 1 MG/3DAYS TD PT72: 1.0000 | MEDICATED_PATCH | TRANSDERMAL | 0 refills | Status: DC

## 2016-09-17 NOTE — Therapy (Signed)
[facility] [place] [place], [address] Phone: [phone]   Fax:  [phone]  Physical Therapy Treatment  Patient Details  Name: Kelsey Cooke MRN: [id number] Date of Birth: [birth year] Referring Provider: Sallee Lange   Encounter Date: 09/17/2016      PT End of Session - 09/17/16 1126    Visit Number 3   Number of Visits 8   Date for PT Re-Evaluation 10/10/16   Authorization Type BCBS   Authorization - Visit Number 3   Authorization - Number of Visits 8   PT Start Time [id number]   PT Stop Time [id number]   PT Time Calculation (min) 34 min   Activity Tolerance Patient tolerated treatment well;No increased pain   Behavior During Therapy [facility] for tasks assessed/performed      Past Medical History:  Diagnosis Date  . Allergic rhinitis   . Anxiety   . Breast tenderness in female 10/09/2015  . Cutaneous skin tags 03/27/2013   Had 2 minute skin tags on upper abdomen near breast removed  . Ear infection 11/13/2015  . Elevated BP 11/12/2014  . Fatigue 11/12/2014  . Fibroid 02/06/2016  . GERD (gastroesophageal reflux disease)   . Headache 01/08/2015  . Hemorrhoids 11/12/2014  . Hypertension   . Menorrhagia 11/08/2013  . Missed period 10/09/2015  . Pelvic pain in female 01/28/2016  . Rectocele 11/12/2014  . Sebaceous cyst of breast   . Sinus infection 11/08/2013    Past Surgical History:  Procedure Laterality Date  . GUM SURGERY    . VARICOSE VEIN SURGERY    . warts      There were no vitals filed for this visit.      Subjective Assessment - 09/17/16 1123    Subjective Pt stated she continues to have headache though less intense.  Reports increased shoulder pain Bil following sweeping the other day.  Current pain scale 5/10 Bil shoulders.  Reports compliance with HEP daily.   Pertinent History HTN,    Patient Stated Goals less headaches, to be able to move her arms better, sit longer, take less pain medication, sleep without pain  medication .   Currently in Pain? Yes   Pain Score 5    Pain Location Shoulder  shoulder joint pain unable to describe   Pain Orientation Right;Left   Pain Descriptors / Indicators --  unable to describe   Pain Radiating Towards both shoulders   Pain Onset 1 to 4 weeks ago   Pain Frequency Constant   Aggravating Factors  activity   Pain Relieving Factors medication                         [facility] Adult PT Treatment/Exercise - 09/17/16 0001      Neck Exercises: Theraband   Scapula Retraction Limitations reports increased shoulder pain, stopped exercise   Shoulder Extension 10 reps;Red     Neck Exercises: Seated   Cervical Isometrics Right lateral flexion;Left lateral flexion;10 reps   Neck Retraction 10 reps   Lateral Flexion Both;10 reps   Lateral Flexion Limitations pain free range   X to V 10 reps   W Back 10 reps   Shoulder Rolls Backwards;10 reps     Neck Exercises: Supine   Neck Retraction 10 reps   Neck Retraction Limitations cervical and scapular retraction x 10    Shoulder Flexion Both;10 reps   Shoulder Flexion Limitations 90 degrees  Manual Therapy   Manual Therapy Soft tissue mobilization   Manual therapy comments done seperate from all other activity.    Soft tissue mobilization to bil trapezius and posterior cervical mm. complete in supine position.  Suboccipitical release and manual traction incorporated into manual as well                  PT Short Term Goals - 09/14/16 1521      PT SHORT TERM GOAL #1   Title Pt cervical rotation to be improved 10 degrees bilaterally to allow safer driving.   Time 2   Period Weeks   Status On-going     PT SHORT TERM GOAL #2   Title Pt cervical strength to be at least a 4/5 to allow pt to sit for an hour without increased pain or headaches.    Time 2   Period Weeks   Status On-going     PT SHORT TERM GOAL #3   Title Pt pain to be no greater than a 4/10 to allow pt to complete 20  minutes of light housework   Time 2   Period Weeks   Status On-going           PT Long Term Goals - 09/14/16 1521      PT LONG TERM GOAL #1   Title Pt to have no headaches to be confident returning to work.   Time 4   Period Weeks   Status On-going     PT LONG TERM GOAL #2   Title Pt pain to be no greater than a 2/10 to allow pt to sleep without using medication.    Time 4   Period Weeks   Status On-going     PT LONG TERM GOAL #3   Title Pt cervical strength to be back to a 5/5 to allow pt to be up all day without increased neck pain.    Time 4   Period Weeks   Status On-going     PT LONG TERM GOAL #4   Title Pt to be able to complete her housework without having to take a break.    Time 4   Period Weeks   Status On-going               Plan - 09/17/16 1133    Clinical Impression Statement Session focus on posture strengthening with cervical stability exercises.  Pt able to demonstrate appropriate form and technique wiht minimal cueing.  Attempted posture strengthening wiht theraband resistance, pt stated increased shoulder pain with new activity so new exercise held today.  Ended session with manual soft tissue mobilizaiton techniques, noted mild spasm and overall tightness with upper traps and posterior cervical mm.  No reports of increased pain at EOS.  Did present with improved cervcial mobility.     Rehab Potential Good   PT Frequency 3x / week   PT Duration 4 weeks   PT Treatment/Interventions ADLs/Self Care Home Management;Therapeutic activities;Therapeutic exercise;Patient/family education;Manual techniques   PT Next Visit Plan  keep cervical area stabilized with backward UBE, ROM in painfree area.. trial with postrual t-band exercises with cervical stabilization if able to tolerate with shoulder   PT Home Exercise Plan SB isometric, shoulder shrugs; 1-16: scapular and cervical retraction, W back       Patient will benefit from skilled therapeutic  intervention in order to improve the following deficits and impairments:  Decreased activity tolerance, Decreased strength, Decreased range of motion, Pain  Visit  Diagnosis: Cervicalgia  Muscle weakness (generalized)     Problem List Patient Active Problem List   Diagnosis Date Noted  . Fibroid 02/06/2016  . Pelvic pain in female 01/28/2016  . Ear infection 11/13/2015  . Breast tenderness in female 10/09/2015  . Missed period 10/09/2015  . Hypertension 10/09/2015  . Esophageal reflux 07/21/2015  . Headache 01/08/2015  . Fatigue 11/12/2014  . Rectocele 11/12/2014  . Hemorrhoids 11/12/2014  . Elevated BP 11/12/2014  . Menorrhagia 11/08/2013  . Sinus infection 11/08/2013  . Depression 08/07/2013  . Sebaceous cyst of breast 03/27/2013  . Cutaneous skin tags 03/27/2013   Ihor Austin, LPTA; CBIS [phone]  Aldona Lento 09/17/2016, 12:04 PM  [facility] [address] [place], [place], [address] Phone: [phone]   Fax:  [phone]  Name: Kelsey Cooke MRN: [id number] Date of Birth: July 03, 1971
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
## 2016-09-20 ENCOUNTER — Telehealth: Payer: Self-pay | Admitting: Family Medicine

## 2016-09-20 ENCOUNTER — Ambulatory Visit (HOSPITAL_COMMUNITY): Payer: BLUE CROSS/BLUE SHIELD | Admitting: Physical Therapy

## 2016-09-20 DIAGNOSIS — M6281 Muscle weakness (generalized): Secondary | ICD-10-CM | POA: Diagnosis not present

## 2016-09-20 DIAGNOSIS — M542 Cervicalgia: Secondary | ICD-10-CM

## 2016-09-20 NOTE — Therapy (Signed)
[place], [place], [address] Phone: [phone]   Fax:  [phone]  Physical Therapy Treatment  Patient Details  Name: Kelsey Cooke MRN: [id number] Date of Birth: Jan 24, 1971 Referring Provider: Sallee Lange   Encounter Date: 09/20/2016      PT End of Session - 09/20/16 1153    Visit Number 4   Number of Visits 8   Date for PT Re-Evaluation 10/10/16   Authorization Type BCBS   Authorization - Visit Number 4   Authorization - Number of Visits 8   PT Start Time 1110   PT Stop Time 1151   PT Time Calculation (min) 41 min   Activity Tolerance Patient tolerated treatment well;No increased pain   Behavior During Therapy WFL for tasks assessed/performed      Past Medical History:  Diagnosis Date  . Allergic rhinitis   . Anxiety   . Breast tenderness in female 10/09/2015  . Cutaneous skin tags 03/27/2013   Had 2 minute skin tags on upper abdomen near breast removed  . Ear infection 11/13/2015  . Elevated BP 11/12/2014  . Fatigue 11/12/2014  . Fibroid 02/06/2016  . GERD (gastroesophageal reflux disease)   . Headache 01/08/2015  . Hemorrhoids 11/12/2014  . Hypertension   . Menorrhagia 11/08/2013  . Missed period 10/09/2015  . Pelvic pain in female 01/28/2016  . Rectocele 11/12/2014  . Sebaceous cyst of breast   . Sinus infection 11/08/2013    Past Surgical History:  Procedure Laterality Date  . GUM SURGERY    . VARICOSE VEIN SURGERY    . warts      There were no vitals filed for this visit.      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 Able to sit for about 15 to 20 minutes    How long can you stand comfortably? 15-20 minutes    How long can you walk comfortably? walks for about 10 minutes at a time.     Patient Stated Goals less headaches, to be able to move her arms better, sit longer, take less pain medication, sleep without pain medication .   Currently in Pain? Yes   Pain Score 4    Pain Location Neck   Pain Orientation Right;Left   Pain Descriptors / Indicators Aching;Constant   Pain Onset 1 to 4 weeks ago            [facility] PT Assessment - 09/20/16 0001      AROM   Cervical - Right Rotation 65   Cervical - Left Rotation 65     Strength   Cervical Extension 4+/5   Cervical - Right Side Bend 4+/5   Cervical - Left Side Bend 4+/5                     [facility] Adult PT Treatment/Exercise - 09/20/16 0001      Neck Exercises: Theraband   Scapula Retraction 10 reps;Red   Shoulder Extension 10 reps;Red   Rows 10 reps;Red     Neck Exercises: Seated   Lateral Flexion Both;10 reps   X to V 10 reps   X to V Weights (lbs) 2   W Back 15 reps   W Back Weights (  lbs) 2   Shoulder Rolls Backwards;10 reps   Other Seated Exercise B UE flexion and abduction x 10     Neck Exercises: Prone   Axial Exentsion 10 reps   W Back 10 reps   Shoulder Extension 10 reps     Manual Therapy   Manual Therapy Soft tissue mobilization   Manual therapy comments done seperate from all other activity.    Soft tissue mobilization to bil trapezius and posterior cervical mm. complete in supine position.  Suboccipitical release and manual traction incorporated into manual as well                PT Education - 09/20/16 1153    Education provided Yes   Education Details advanced HEP   Person(s) Educated Patient   Methods Explanation   Comprehension Verbalized understanding          PT Short Term Goals - 09/20/16 1159      PT SHORT TERM GOAL #1   Title Pt cervical rotation to be improved 10 degrees bilaterally to allow safer driving.   Time 2   Period Weeks   Status Achieved     PT SHORT TERM GOAL #2   Title Pt cervical strength to be at least a 4/5 to allow pt to sit  for an hour without increased pain or headaches.    Time 2   Period Weeks   Status Achieved     PT SHORT TERM GOAL #3   Title Pt pain to be no greater than a 4/10 to allow pt to complete 20 minutes of light housework   Time 2   Period Weeks   Status Achieved           PT Long Term Goals - 09/20/16 1159      PT LONG TERM GOAL #1   Title Pt to have no headaches to be confident returning to work.   Time 4   Period Weeks   Status On-going     PT LONG TERM GOAL #2   Title Pt pain to be no greater than a 2/10 to allow pt to sleep without using medication.    Time 4   Period Weeks   Status On-going     PT LONG TERM GOAL #3   Title Pt cervical strength to be back to a 5/5 to allow pt to be up all day without increased neck pain.    Time 4   Period Weeks   Status On-going     PT LONG TERM GOAL #4   Title Pt to be able to complete her housework without having to take a break.    Time 4   Period Weeks   Status On-going               Plan - 09/20/16 1153    Clinical Impression Statement Pt ROM and strength has improved with decreased overall pain and headaches. Pt continues to be tender to the touch but there are not palpatable spasms in cervical or trapezius area.  Pt will continue to benefit from strengthening.    Rehab Potential Good   PT Frequency 3x / week   PT Duration 4 weeks   PT Treatment/Interventions ADLs/Self Care Home Management;Therapeutic activities;Therapeutic exercise;Patient/family education;Manual techniques   PT Next Visit Plan begin sidelying cervical sidebend    PT Home Exercise Plan SB isometric, shoulder shrugs; 1-16: scapular and cervical retraction, W back ; 09/20/2016:  sitting flexion, abduction, prone shoulder extension, scapular  retraction,       Patient will benefit from skilled therapeutic intervention in order to improve the following deficits and impairments:  Decreased activity tolerance, Decreased strength, Decreased range of  motion, Pain  Visit Diagnosis: Muscle weakness (generalized)  Cervicalgia     Problem List Patient Active Problem List   Diagnosis Date Noted  . Fibroid 02/06/2016  . Pelvic pain in female 01/28/2016  . Ear infection 11/13/2015  . Breast tenderness in female 10/09/2015  . Missed period 10/09/2015  . Hypertension 10/09/2015  . Esophageal reflux 07/21/2015  . Headache 01/08/2015  . Fatigue 11/12/2014  . Rectocele 11/12/2014  . Hemorrhoids 11/12/2014  . Elevated BP 11/12/2014  . Menorrhagia 11/08/2013  . Sinus infection 11/08/2013  . Depression 08/07/2013  . Sebaceous cyst of breast 03/27/2013  . Cutaneous skin tags 03/27/2013  Rayetta Humphrey, PT CLT [phone] 09/20/2016, 12:00 PM  [place] [address] [place], [place], [address] Phone: [phone]   Fax:  [phone]  Name: Kelsey Cooke MRN: [id number] Date of Birth: 10-Jul-1971

## 2016-09-20 NOTE — Patient Instructions (Addendum)
ROM: Abduction (Standing)   Keeping neck still Bring arms straight out from sides and raise as high as possible without pain. Repeat __10__ times per set. Do _1___ sets per session. Do _2___ sessions per day.  http://orth.exer.us/910   Copyright  VHI. All rights reserved.  ROM: Flexion (Standing)   Keeping your neck still Bring arms straight out in front and raise as high as possible without pain. Keep palms facing up. Repeat _10___ times per set. Do _1___ sets per session. Do ___2_ sessions per day.  http://orth.exer.us/908   Copyright  VHI. All rights reserved.  Scapular Retraction (Prone)    Lie with arms at sides. Pinch shoulder blades together and raise arms a few inches from floor. Repeat _10___ times per set. Do __1__ sets per session. Do __2__ sessions per day.  http://orth.exer.us/954   Copyright  VHI. All rights reserved.  Scapular Retraction: Abduction (Prone)    Lie with upper arms straight out from sides, elbows bent to 90. Pinch shoulder blades together and raise arms a few inches from floor. Repeat _10___ times per set. Do _1__ sets per session. Do __2__ sessions per day.  http://orth.exer.us/956   Copyright  VHI. All rights reserved.

## 2016-09-20 NOTE — Telephone Encounter (Signed)
Called patient to verified her date of return. She has appointment here 2/5 and would like to return to work 2/6 if possible and back page of her paper work needs to be filled out by physician especially the part about restrictions.It has to be faxed by 1/24 form is paid for.

## 2016-09-23 ENCOUNTER — Ambulatory Visit (HOSPITAL_COMMUNITY): Payer: BLUE CROSS/BLUE SHIELD

## 2016-09-23 DIAGNOSIS — M6281 Muscle weakness (generalized): Secondary | ICD-10-CM | POA: Diagnosis not present

## 2016-09-23 DIAGNOSIS — M542 Cervicalgia: Secondary | ICD-10-CM

## 2016-09-23 NOTE — Therapy (Signed)
[place], [place], [address] Phone: [phone]   Fax:  [phone]  Physical Therapy Treatment  Patient Details  Name: Kelsey Cooke MRN: [id number] Date of Birth: Nov 17, 1970 Referring Provider: Sallee Lange   Encounter Date: 09/23/2016      PT End of Session - 09/23/16 1129    Visit Number 5   Number of Visits 8   Date for PT Re-Evaluation 10/10/16   Authorization Type BCBS   Authorization - Visit Number 5   Authorization - Number of Visits 8   PT Start Time [id number]   PT Stop Time 1202   PT Time Calculation (min) 39 min   Activity Tolerance Patient tolerated treatment well;No increased pain   Behavior During Therapy WFL for tasks assessed/performed      Past Medical History:  Diagnosis Date  . Allergic rhinitis   . Anxiety   . Breast tenderness in female 10/09/2015  . Cutaneous skin tags 03/27/2013   Had 2 minute skin tags on upper abdomen near breast removed  . Ear infection 11/13/2015  . Elevated BP 11/12/2014  . Fatigue 11/12/2014  . Fibroid 02/06/2016  . GERD (gastroesophageal reflux disease)   . Headache 01/08/2015  . Hemorrhoids 11/12/2014  . Hypertension   . Menorrhagia 11/08/2013  . Missed period 10/09/2015  . Pelvic pain in female 01/28/2016  . Rectocele 11/12/2014  . Sebaceous cyst of breast   . Sinus infection 11/08/2013    Past Surgical History:  Procedure Laterality Date  . GUM SURGERY    . VARICOSE VEIN SURGERY    . warts      There were no vitals filed for this visit.      Subjective Assessment - 09/23/16 1125    Subjective Pt stated she continues to have headaches though reports decreased intensity, current pain scale 4/10 lateral and posterior neck, feels very tight.     Pertinent History HTN,    Patient Stated Goals less headaches, to be able to move her arms better, sit longer, take less pain medication, sleep without pain medication .   Currently in Pain? Yes   Pain Score 4    Pain Location Neck   Pain Orientation Right;Left;Posterior   Pain Descriptors / Indicators Sore;Tightness  posterior neck to ear   Pain Type Acute pain   Pain Radiating Towards reduced radicular shoulder symptoms   Pain Onset 1 to 4 weeks ago   Pain Frequency Constant   Aggravating Factors  activity   Pain Relieving Factors medication   Multiple Pain Sites No                         [facility] Adult PT Treatment/Exercise - 09/23/16 0001      Neck Exercises: Seated   Neck Retraction 10 reps   Lateral Flexion Both;10 reps   X to V 15 reps   X to V Weights (lbs) 2   W Back 15 reps   Shoulder Rolls Backwards;10 reps     Neck Exercises: Sidelying   Lateral Flexion Both;10 reps     Neck Exercises: Prone   Axial Exentsion 10 reps   Shoulder Extension 10 reps     Manual Therapy   Manual Therapy Soft tissue mobilization   Manual therapy comments done seperate from all other activity.    Soft tissue mobilization to bil trapezius and posterior cervical mm. complete in supine position.  Suboccipitical release and manual traction incorporated  into manual as well                  PT Short Term Goals - 09/20/16 1159      PT SHORT TERM GOAL #1   Title Pt cervical rotation to be improved 10 degrees bilaterally to allow safer driving.   Time 2   Period Weeks   Status Achieved     PT SHORT TERM GOAL #2   Title Pt cervical strength to be at least a 4/5 to allow pt to sit for an hour without increased pain or headaches.    Time 2   Period Weeks   Status Achieved     PT SHORT TERM GOAL #3   Title Pt pain to be no greater than a 4/10 to allow pt to complete 20 minutes of light housework   Time 2   Period Weeks   Status Achieved           PT Long Term Goals - 09/20/16 1159      PT LONG TERM GOAL #1   Title Pt to have no headaches to be confident returning to work.   Time 4   Period Weeks   Status On-going     PT LONG TERM GOAL #2   Title Pt pain to  be no greater than a 2/10 to allow pt to sleep without using medication.    Time 4   Period Weeks   Status On-going     PT LONG TERM GOAL #3   Title Pt cervical strength to be back to a 5/5 to allow pt to be up all day without increased neck pain.    Time 4   Period Weeks   Status On-going     PT LONG TERM GOAL #4   Title Pt to be able to complete her housework without having to take a break.    Time 4   Period Weeks   Status On-going               Plan - 09/23/16 1242    Clinical Impression Statement Pt progressing well with improved awareness of posture and reports of overall pain and headaches reducing.  Pt continues to present guarding with cervical movements.  Session focus on improivng cervical mobilty and posture strengthening.  Added sidelying cervical sidebend for strengthening with min cueing for form/tech.  End of session with manual soft tissue mobilization technqiues to reduce tightness posterior neck and near occiptal lobes with reports of decreased tightness, pain and headache following manual.     Rehab Potential Good   PT Frequency 3x / week   PT Duration 4 weeks   PT Treatment/Interventions ADLs/Self Care Home Management;Therapeutic activities;Therapeutic exercise;Patient/family education;Manual techniques   PT Next Visit Plan Continue posture strengthening and manual for pain and tightness   PT Home Exercise Plan SB isometric, shoulder shrugs; 1-16: scapular and cervical retraction, W back ; 09/20/2016:  sitting flexion, abduction, prone shoulder extension, scapular retraction,       Patient will benefit from skilled therapeutic intervention in order to improve the following deficits and impairments:  Decreased activity tolerance, Decreased strength, Decreased range of motion, Pain  Visit Diagnosis: Muscle weakness (generalized)  Cervicalgia     Problem List Patient Active Problem List   Diagnosis Date Noted  . Fibroid 02/06/2016  . Pelvic pain in  female 01/28/2016  . Ear infection 11/13/2015  . Breast tenderness in female 10/09/2015  . Missed period 10/09/2015  . Hypertension  10/09/2015  . Esophageal reflux 07/21/2015  . Headache 01/08/2015  . Fatigue 11/12/2014  . Rectocele 11/12/2014  . Hemorrhoids 11/12/2014  . Elevated BP 11/12/2014  . Menorrhagia 11/08/2013  . Sinus infection 11/08/2013  . Depression 08/07/2013  . Sebaceous cyst of breast 03/27/2013  . Cutaneous skin tags 03/27/2013   Ihor Austin, LPTA; CBIS [phone]  Aldona Lento 09/23/2016, 12:48 PM  [place] [address] [place], [place], [address] Phone: [phone]   Fax:  [phone]  Name: Jellisa Mctee MRN: [id number] Date of Birth: 1971/01/12

## 2016-09-27 ENCOUNTER — Ambulatory Visit (HOSPITAL_COMMUNITY): Payer: BLUE CROSS/BLUE SHIELD | Admitting: Physical Therapy

## 2016-09-27 ENCOUNTER — Telehealth (HOSPITAL_COMMUNITY): Payer: Self-pay | Admitting: Physical Therapy

## 2016-09-27 NOTE — Telephone Encounter (Signed)
Pt is on a cruise and will come back for her next visit on 10/05/16, called pt home and l/m with date and apptment time per CR. NF 09/27/16

## 2016-09-29 ENCOUNTER — Ambulatory Visit (HOSPITAL_COMMUNITY): Payer: BLUE CROSS/BLUE SHIELD | Admitting: Physical Therapy

## 2016-10-04 ENCOUNTER — Encounter: Payer: Self-pay | Admitting: Family Medicine

## 2016-10-04 ENCOUNTER — Ambulatory Visit (INDEPENDENT_AMBULATORY_CARE_PROVIDER_SITE_OTHER): Payer: BLUE CROSS/BLUE SHIELD | Admitting: Family Medicine

## 2016-10-04 VITALS — BP 118/76 | Ht 64.0 in | Wt 198.6 lb

## 2016-10-04 DIAGNOSIS — S161XXA Strain of muscle, fascia and tendon at neck level, initial encounter: Secondary | ICD-10-CM

## 2016-10-04 MED ORDER — AMOXICILLIN-POT CLAVULANATE 875-125 MG PO TABS: 1.0000 | ORAL_TABLET | Freq: Two times a day (BID) | ORAL | 0 refills | Status: AC
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
## 2016-10-05 ENCOUNTER — Ambulatory Visit (HOSPITAL_COMMUNITY): Payer: BLUE CROSS/BLUE SHIELD | Attending: Family Medicine | Admitting: Physical Therapy

## 2016-10-05 DIAGNOSIS — M542 Cervicalgia: Secondary | ICD-10-CM | POA: Diagnosis not present

## 2016-10-05 DIAGNOSIS — M6281 Muscle weakness (generalized): Secondary | ICD-10-CM | POA: Diagnosis not present

## 2016-10-05 NOTE — Therapy (Signed)
[place], [place], [address] Phone: [phone]   Fax:  [phone]  Physical Therapy Treatment/reassessment  Patient Details  Name: Kelsey Cooke MRN: [id number] Date of Birth: [birth year] Referring Provider: Sallee Lange   Encounter Date: 10/05/2016      PT End of Session - 10/05/16 0859    Visit Number 6   Number of Visits 8   Date for PT Re-Evaluation 10/10/16   Authorization Type BCBS   Authorization - Visit Number 6   Authorization - Number of Visits 8   PT Start Time 0815   PT Stop Time 0093   PT Time Calculation (min) 43 min   Activity Tolerance Patient tolerated treatment well;No increased pain   Behavior During Therapy WFL for tasks assessed/performed      Past Medical History:  Diagnosis Date  . Allergic rhinitis   . Anxiety   . Breast tenderness in female 10/09/2015  . Cutaneous skin tags 03/27/2013   Had 2 minute skin tags on upper abdomen near breast removed  . Ear infection 11/13/2015  . Elevated BP 11/12/2014  . Fatigue 11/12/2014  . Fibroid 02/06/2016  . GERD (gastroesophageal reflux disease)   . Headache 01/08/2015  . Hemorrhoids 11/12/2014  . Hypertension   . Menorrhagia 11/08/2013  . Missed period 10/09/2015  . Pelvic pain in female 01/28/2016  . Rectocele 11/12/2014  . Sebaceous cyst of breast   . Sinus infection 11/08/2013    Past Surgical History:  Procedure Laterality Date  . GUM SURGERY    . VARICOSE VEIN SURGERY    . warts      There were no vitals filed for this visit.      Subjective Assessment - 10/05/16 0817    Subjective Kelsey Cooke states that she is not so much in pain as she is stiff.   She is using her arms much better and has cut back on her medication.  She states that she is having headaches once or twice a day intensity has gone down to a three.    Pertinent History HTN,    Patient Stated Goals less headaches, to be able to move her arms better, sit longer, take  less pain medication, sleep without pain medication .   Currently in Pain? No/denies   Pain Onset 1 to 4 weeks ago            [facility] PT Assessment - 10/05/16 0001      Assessment   Medical Diagnosis cervical strain    Referring Provider Nicki Reaper Luking    Onset Date/Surgical Date 08/27/16   Next MD Visit 09/14/2016   Prior Therapy none      Precautions   Precautions None     Restrictions   Weight Bearing Restrictions No     Balance Screen   Has the patient fallen in the past 6 months No   Has the patient had a decrease in activity level because of a fear of falling?  No   Is the patient reluctant to leave their home because of a fear of falling?  No     Prior Function   Level of Independence Independent   Vocation Full time employment   Vocation Requirements computer work    Leisure walking      Cognition   Overall Cognitive Status Within Functional Limits for tasks assessed     Posture/Postural Control   Posture/Postural Control No significant limitations     AROM  Cervical Flexion 45  was 44 no complaints    Cervical Extension 55   was 45    Cervical - Right Side Bend 50  was 48   Cervical - Left Side Bend 50  was 43   Cervical - Right Rotation 70  was 40   Cervical - Left Rotation 70  pain at end range was 40     Strength   Cervical Extension 5/5  was 3/5 at eval   Cervical - Right Side Bend 5/5  was 3/5 at eval    Cervical - Left Side Bend 5/5  was 3/5 at eval      Palpation   Palpation comment tight trapezius mm B without spasm                      [facility] Adult PT Treatment/Exercise - 10/05/16 0001      Neck Exercises: Seated   Cervical Isometrics 5 reps   Neck Retraction 10 reps   Cervical Rotation 5 reps     Neck Exercises: Supine   Neck Retraction 5 reps   Neck Retraction Limitations cervical and scapular retraction x 10      Manual Therapy   Manual Therapy Joint mobilization;Soft tissue mobilization   Manual therapy  comments done seperate from all other activity.    Joint Mobilization Grade II    Soft tissue mobilization to bil trapezius and posterior cervical mm. complete in supine position.  Suboccipitical release and manual traction incorporated into manual as well                  PT Short Term Goals - 10/05/16 0901      PT SHORT TERM GOAL #1   Title Pt cervical rotation to be improved 10 degrees bilaterally to allow safer driving.   Time 2   Period Weeks   Status Achieved     PT SHORT TERM GOAL #2   Title Pt cervical strength to be at least a 4/5 to allow pt to sit for an hour without increased pain or headaches.    Time 2   Period Weeks   Status Achieved     PT SHORT TERM GOAL #3   Title Pt pain to be no greater than a 4/10 to allow pt to complete 20 minutes of light housework   Time 2   Period Weeks   Status Achieved           PT Long Term Goals - 10/05/16 0901      PT LONG TERM GOAL #1   Title Pt to have no headaches to be confident returning to work.   Time 4   Period Weeks   Status Partially Met     PT LONG TERM GOAL #2   Title Pt pain to be no greater than a 2/10 to allow pt to sleep without using medication.    Time 4   Period Weeks   Status Achieved     PT LONG TERM GOAL #3   Title Pt cervical strength to be back to a 5/5 to allow pt to be up all day without increased neck pain.    Time 4   Period Weeks   Status Partially Met     PT LONG TERM GOAL #4   Title Pt to be able to complete her housework without having to take a break.    Time 4   Period Weeks   Status Partially Met  Plan - 10/05/16 0859    Clinical Impression Statement Pt returning to 1/2 days at work.  Pt reassessed with improvement in all areas.  Pt will benefit from her last two appointments to make sure pt returns to work with no issues.     Rehab Potential Good   PT Frequency 3x / week   PT Duration 4 weeks   PT Treatment/Interventions ADLs/Self Care Home  Management;Therapeutic activities;Therapeutic exercise;Patient/family education;Manual techniques   PT Next Visit Plan given pt T-band for postural exercises for HEP, encourage pt to vacuum at home.  Discharge in two visits.    PT Home Exercise Plan SB isometric, shoulder shrugs; 1-16: scapular and cervical retraction, W back ; 09/20/2016:  sitting flexion, abduction, prone shoulder extension, scapular retraction,       Patient will benefit from skilled therapeutic intervention in order to improve the following deficits and impairments:  Decreased activity tolerance, Decreased strength, Decreased range of motion, Pain  Visit Diagnosis: Muscle weakness (generalized)  Cervicalgia     Problem List Patient Active Problem List   Diagnosis Date Noted  . Fibroid 02/06/2016  . Pelvic pain in female 01/28/2016  . Ear infection 11/13/2015  . Breast tenderness in female 10/09/2015  . Missed period 10/09/2015  . Hypertension 10/09/2015  . Esophageal reflux 07/21/2015  . Headache 01/08/2015  . Fatigue 11/12/2014  . Rectocele 11/12/2014  . Hemorrhoids 11/12/2014  . Elevated BP 11/12/2014  . Menorrhagia 11/08/2013  . Sinus infection 11/08/2013  . Depression 08/07/2013  . Sebaceous cyst of breast 03/27/2013  . Cutaneous skin tags 03/27/2013    Rayetta Humphrey, PT CLT [phone] 10/05/2016, 9:02 AM  [place] [address] [place], [place], [address] Phone: [phone]   Fax:  [phone]  Name: Kelsey Cooke MRN: [id number] Date of Birth: 1971/07/28

## 2016-10-12 ENCOUNTER — Ambulatory Visit (HOSPITAL_COMMUNITY): Payer: BLUE CROSS/BLUE SHIELD

## 2016-10-12 DIAGNOSIS — M542 Cervicalgia: Secondary | ICD-10-CM

## 2016-10-12 DIAGNOSIS — M6281 Muscle weakness (generalized): Secondary | ICD-10-CM | POA: Diagnosis not present

## 2016-10-12 NOTE — Therapy (Signed)
[place], [place], [address] Phone: [phone]   Fax:  [phone]  Physical Therapy Treatment  Patient Details  Name: Kelsey Cooke MRN: [id number] Date of Birth: 01-06-1971 Referring Provider: Sallee Lange   Encounter Date: 10/12/2016      PT End of Session - 10/12/16 0827    Visit Number 7   Number of Visits 8   Date for PT Re-Evaluation 10/10/16   Authorization Type BCBS   Authorization - Visit Number 7   Authorization - Number of Visits 8   PT Start Time 6237   PT Stop Time 6283   PT Time Calculation (min) 41 min   Activity Tolerance Patient tolerated treatment well;No increased pain   Behavior During Therapy WFL for tasks assessed/performed      Past Medical History:  Diagnosis Date  . Allergic rhinitis   . Anxiety   . Breast tenderness in female 10/09/2015  . Cutaneous skin tags 03/27/2013   Had 2 minute skin tags on upper abdomen near breast removed  . Ear infection 11/13/2015  . Elevated BP 11/12/2014  . Fatigue 11/12/2014  . Fibroid 02/06/2016  . GERD (gastroesophageal reflux disease)   . Headache 01/08/2015  . Hemorrhoids 11/12/2014  . Hypertension   . Menorrhagia 11/08/2013  . Missed period 10/09/2015  . Pelvic pain in female 01/28/2016  . Rectocele 11/12/2014  . Sebaceous cyst of breast   . Sinus infection 11/08/2013    Past Surgical History:  Procedure Laterality Date  . GUM SURGERY    . VARICOSE VEIN SURGERY    . warts      There were no vitals filed for this visit.      Subjective Assessment - 10/12/16 0815    Subjective Pt stated current cervical pain scale 3-4/10 soreness.  reports compliance with HEP daily.  Received profession massage yesterday and reports increased relaxation at entrance.  Has returned to full days at work this week.    Pertinent History HTN,    Patient Stated Goals less headaches, to be able to move her arms better, sit longer, take less pain medication,  sleep without pain medication .   Currently in Pain? Yes   Pain Score 4    Pain Location Neck   Pain Orientation Right;Left;Posterior   Pain Descriptors / Indicators Sore   Pain Type Acute pain   Pain Onset 1 to 4 weeks ago   Pain Frequency Constant   Aggravating Factors  activity   Pain Relieving Factors medication   Multiple Pain Sites No                         [facility] Adult PT Treatment/Exercise - 10/12/16 0001      Neck Exercises: Theraband   Scapula Retraction 15 reps;Green   Shoulder Extension 15 reps;Green   Rows 15 reps;Green     Neck Exercises: Seated   Neck Retraction 15 reps   X to V 15 reps   X to V Weights (lbs) 2   W Back 15 reps   Shoulder Rolls Backwards;10 reps     Neck Exercises: Prone   Neck Retraction 15 reps   W Back 10 reps   Shoulder Extension 15 reps   Rows Limitations 15x 2#     Manual Therapy   Manual Therapy Joint mobilization;Soft tissue mobilization   Manual therapy comments done seperate from all other activity.    Soft tissue mobilization to  bil trapezius and posterior cervical mm. complete in supine position.  Position release technqiues to Bil UT; Suboccipitical release and manual traction incorporated into manual as well                  PT Short Term Goals - 10/05/16 0901      PT SHORT TERM GOAL #1   Title Pt cervical rotation to be improved 10 degrees bilaterally to allow safer driving.   Time 2   Period Weeks   Status Achieved     PT SHORT TERM GOAL #2   Title Pt cervical strength to be at least a 4/5 to allow pt to sit for an hour without increased pain or headaches.    Time 2   Period Weeks   Status Achieved     PT SHORT TERM GOAL #3   Title Pt pain to be no greater than a 4/10 to allow pt to complete 20 minutes of light housework   Time 2   Period Weeks   Status Achieved           PT Long Term Goals - 10/05/16 0901      PT LONG TERM GOAL #1   Title Pt to have no headaches to be  confident returning to work.   Time 4   Period Weeks   Status Partially Met     PT LONG TERM GOAL #2   Title Pt pain to be no greater than a 2/10 to allow pt to sleep without using medication.    Time 4   Period Weeks   Status Achieved     PT LONG TERM GOAL #3   Title Pt cervical strength to be back to a 5/5 to allow pt to be up all day without increased neck pain.    Time 4   Period Weeks   Status Partially Met     PT LONG TERM GOAL #4   Title Pt to be able to complete her housework without having to take a break.    Time 4   Period Weeks   Status Partially Met               Plan - 10/12/16 0901    Clinical Impression Statement Session focus on postural strengthening.  Pt given theraband and printout to add postural strengthening to HEP.  Pt able to demonstrate appropraite form and technqiues with minimal cueing required.  EOS with manual soft tissue mobilization to reduce tightness and improve ROM, no reports of increased pain through session.  reviewed compliance and ADLs, encouraged pt to begin vaccuming at home as progressing of independence with ADLs.     Rehab Potential Good   PT Frequency 3x / week   PT Duration 4 weeks   PT Treatment/Interventions ADLs/Self Care Home Management;Therapeutic activities;Therapeutic exercise;Patient/family education;Manual techniques   PT Next Visit Plan Discharge in one more visit.     PT Home Exercise Plan SB isometric, shoulder shrugs; 1-16: scapular and cervical retraction, W back ; 09/20/2016:  sitting flexion, abduction, prone shoulder extension, scapular retraction,       Patient will benefit from skilled therapeutic intervention in order to improve the following deficits and impairments:  Decreased activity tolerance, Decreased strength, Decreased range of motion, Pain  Visit Diagnosis: Muscle weakness (generalized)  Cervicalgia     Problem List Patient Active Problem List   Diagnosis Date Noted  . Fibroid  02/06/2016  . Pelvic pain in female 01/28/2016  . Ear infection  11/13/2015  . Breast tenderness in female 10/09/2015  . Missed period 10/09/2015  . Hypertension 10/09/2015  . Esophageal reflux 07/21/2015  . Headache 01/08/2015  . Fatigue 11/12/2014  . Rectocele 11/12/2014  . Hemorrhoids 11/12/2014  . Elevated BP 11/12/2014  . Menorrhagia 11/08/2013  . Sinus infection 11/08/2013  . Depression 08/07/2013  . Sebaceous cyst of breast 03/27/2013  . Cutaneous skin tags 03/27/2013   Ihor Austin, LPTA; CBIS [phone]  Aldona Lento 10/12/2016, 10:04 AM  [facility] [address] [place], [place], [address] Phone: [phone]   Fax:  [phone]  Name: Kelsey Cooke MRN: [id number] Date of Birth: 1971/08/22

## 2016-10-14 ENCOUNTER — Ambulatory Visit (HOSPITAL_COMMUNITY): Payer: BLUE CROSS/BLUE SHIELD | Admitting: Physical Therapy

## 2016-10-14 DIAGNOSIS — M542 Cervicalgia: Secondary | ICD-10-CM

## 2016-10-14 DIAGNOSIS — M6281 Muscle weakness (generalized): Secondary | ICD-10-CM | POA: Diagnosis not present

## 2016-10-14 NOTE — Therapy (Signed)
[place], [place], [address] Phone: [phone]   Fax:  [phone]  Physical Therapy Treatment  Patient Details  Name: Kelsey Cooke MRN: [id number] Date of Birth: 12-Nov-1970 Referring Provider: Sallee Lange   Encounter Date: 10/14/2016      PT End of Session - 10/14/16 0853    Visit Number 8   Number of Visits 8   Date for PT Re-Evaluation 10/10/16   Authorization Type BCBS   Authorization - Visit Number 8   Authorization - Number of Visits 8   PT Start Time 0962   PT Stop Time 0952   PT Time Calculation (min) 32 min   Activity Tolerance Patient tolerated treatment well;No increased pain   Behavior During Therapy WFL for tasks assessed/performed      Past Medical History:  Diagnosis Date  . Allergic rhinitis   . Anxiety   . Breast tenderness in female 10/09/2015  . Cutaneous skin tags 03/27/2013   Had 2 minute skin tags on upper abdomen near breast removed  . Ear infection 11/13/2015  . Elevated BP 11/12/2014  . Fatigue 11/12/2014  . Fibroid 02/06/2016  . GERD (gastroesophageal reflux disease)   . Headache 01/08/2015  . Hemorrhoids 11/12/2014  . Hypertension   . Menorrhagia 11/08/2013  . Missed period 10/09/2015  . Pelvic pain in female 01/28/2016  . Rectocele 11/12/2014  . Sebaceous cyst of breast   . Sinus infection 11/08/2013    Past Surgical History:  Procedure Laterality Date  . GUM SURGERY    . VARICOSE VEIN SURGERY    . warts      There were no vitals filed for this visit.      Subjective Assessment - 10/14/16 0821    Subjective Pt states that she is doing pretty good.  Occasionally still gets some faint headaches.     Pertinent History HTN,    How long can you sit comfortably? Able to sit for about 15 to 20 minutes    How long can you stand comfortably? 15-20 minutes    How long can you walk comfortably? walks for about 10 minutes at a time.     Patient Stated Goals less headaches, to  be able to move her arms better, sit longer, take less pain medication, sleep without pain medication .   Currently in Pain? No/denies   Pain Onset 1 to 4 weeks ago            [facility] PT Assessment - 10/14/16 0001      Assessment   Medical Diagnosis cervical strain    Referring Provider Nicki Reaper Luking    Onset Date/Surgical Date 08/27/16   Next MD Visit 09/14/2016   Prior Therapy none      Precautions   Precautions None     Restrictions   Weight Bearing Restrictions No     Prior Function   Level of Independence Independent   Vocation Full time employment   Vocation Requirements computer work    Leisure walking      Cognition   Overall Cognitive Status Within Functional Limits for tasks assessed     Posture/Postural Control   Posture/Postural Control No significant limitations     AROM   Cervical Flexion 45  was 44 no complaints    Cervical Extension 55   was 45    Cervical - Right Side Bend 50  was 48   Cervical - Left Side Bend 50  was 43  Cervical - Right Rotation 73  was 40   Cervical - Left Rotation 75  pain at end range was 40     Strength   Cervical Extension 5/5  was 3/5 at eval   Cervical - Right Side Bend 5/5  was 3/5 at eval    Cervical - Left Side Bend 5/5  was 3/5 at eval      Palpation   Palpation comment tight trapezius mm B without spasm                      [facility] Adult PT Treatment/Exercise - 10/14/16 0001      Manual Therapy   Manual Therapy Soft tissue mobilization;Myofascial release   Manual therapy comments done seperate from all other activity.    Soft tissue mobilization to bil trapezius and posterior cervical mm. complete in supine position.  Position release technqiues to Bil UT; Suboccipitical release and manual traction incorporated into manual as well                  PT Short Term Goals - 10/14/16 0855      PT SHORT TERM GOAL #1   Title Pt cervical rotation to be improved 10 degrees bilaterally to  allow safer driving.   Time 2   Period Weeks   Status Achieved     PT SHORT TERM GOAL #2   Title Pt cervical strength to be at least a 4/5 to allow pt to sit for an hour without increased pain or headaches.    Time 2   Period Weeks   Status Achieved     PT SHORT TERM GOAL #3   Title Pt pain to be no greater than a 4/10 to allow pt to complete 20 minutes of light housework   Time 2   Period Weeks   Status Achieved           PT Long Term Goals - 10/14/16 0855      PT LONG TERM GOAL #1   Title Pt to have no headaches to be confident returning to work.   Time 4   Period Weeks   Status Partially Met     PT LONG TERM GOAL #2   Title Pt pain to be no greater than a 2/10 to allow pt to sleep without using medication.    Time 4   Period Weeks   Status Achieved     PT LONG TERM GOAL #3   Title Pt cervical strength to be back to a 5/5 to allow pt to be up all day without increased neck pain.    Time 4   Period Weeks   Status Achieved     PT LONG TERM GOAL #4   Title Pt to be able to complete her housework without having to take a break.    Time 4   Period Weeks   Status Partially Met               Plan - 10/14/16 0854    Clinical Impression Statement Pt ROM improved slightly.  Pt is back to work full time.  Has no questions on exercises.  Pt traps continue to have some tightness B but overall feel better than at initial evaluation.  Pt is ready for discharge.    Rehab Potential Good   PT Frequency 3x / week   PT Duration 4 weeks   PT Treatment/Interventions ADLs/Self Care Home Management;Therapeutic activities;Therapeutic exercise;Patient/family education;Manual techniques  PT Next Visit Plan Discharge    PT Home Exercise Plan SB isometric, shoulder shrugs; 1-16: scapular and cervical retraction, W back ; 09/20/2016:  sitting flexion, abduction, prone shoulder extension, scapular retraction,       Patient will benefit from skilled therapeutic intervention in  order to improve the following deficits and impairments:  Decreased activity tolerance, Decreased strength, Decreased range of motion, Pain  Visit Diagnosis: Muscle weakness (generalized)  Cervicalgia     Problem List Patient Active Problem List   Diagnosis Date Noted  . Fibroid 02/06/2016  . Pelvic pain in female 01/28/2016  . Ear infection 11/13/2015  . Breast tenderness in female 10/09/2015  . Missed period 10/09/2015  . Hypertension 10/09/2015  . Esophageal reflux 07/21/2015  . Headache 01/08/2015  . Fatigue 11/12/2014  . Rectocele 11/12/2014  . Hemorrhoids 11/12/2014  . Elevated BP 11/12/2014  . Menorrhagia 11/08/2013  . Sinus infection 11/08/2013  . Depression 08/07/2013  . Sebaceous cyst of breast 03/27/2013  . Cutaneous skin tags 03/27/2013    Rayetta Humphrey, PT CLT [phone] 10/14/2016, 8:56 AM  [place] [address] [place], [place], [address] Phone: [phone]   Fax:  [phone]  Name: Kelsey Cooke MRN: [id number] Date of Birth: [birth year]  PHYSICAL THERAPY DISCHARGE SUMMARY  Visits from Start of Care: 8  Current functional level related to goals / functional outcomes: As above   Remaining deficits: As above   Education / Equipment: Pt I in HEP Plan: Patient agrees to discharge.  Patient goals were met. Patient is being discharged due to meeting the stated rehab goals.  ?????       Rayetta Humphrey, [place] CLT [phone]

## 2016-10-15 ENCOUNTER — Other Ambulatory Visit: Payer: Self-pay | Admitting: Adult Health

## 2016-10-15 ENCOUNTER — Other Ambulatory Visit: Payer: Self-pay | Admitting: *Deleted

## 2016-10-15 ENCOUNTER — Telehealth: Payer: Self-pay | Admitting: Family Medicine

## 2016-10-15 MED ORDER — BENZONATATE 100 MG PO CAPS: 100.0000 mg | ORAL_CAPSULE | Freq: Three times a day (TID) | ORAL | 0 refills | Status: DC | PRN

## 2016-10-15 MED ORDER — CLARITHROMYCIN 500 MG PO TABS: 500.0000 mg | ORAL_TABLET | Freq: Two times a day (BID) | ORAL | 0 refills | Status: DC

## 2016-10-15 NOTE — Telephone Encounter (Signed)
meds sent to pharm. Pt notified.  

## 2016-10-15 NOTE — Telephone Encounter (Signed)
Pt called stating that she has finished her antibiotic and is not any better and that her cough has gotten worse. Please advise.    [facility]

## 2016-10-15 NOTE — Telephone Encounter (Signed)
Finished Augmentin for sinus infection

## 2016-10-15 NOTE — Telephone Encounter (Signed)
biaxin 500 bid ten d, tess perles 100 mg thirty one tid prn cough

## 2016-11-16 ENCOUNTER — Ambulatory Visit (INDEPENDENT_AMBULATORY_CARE_PROVIDER_SITE_OTHER): Payer: BLUE CROSS/BLUE SHIELD | Admitting: Adult Health

## 2016-11-16 ENCOUNTER — Encounter: Payer: Self-pay | Admitting: Adult Health

## 2016-11-16 ENCOUNTER — Other Ambulatory Visit (HOSPITAL_COMMUNITY)
Admission: RE | Admit: 2016-11-16 | Discharge: 2016-11-16 | Disposition: A | Discharge status: Home / Self Care | Payer: BLUE CROSS/BLUE SHIELD | Source: Ambulatory Visit | Attending: Adult Health | Admitting: Adult Health

## 2016-11-16 VITALS — BP 120/80 | HR 60 | Ht 65.0 in | Wt 195.5 lb

## 2016-11-16 DIAGNOSIS — Z1211 Encounter for screening for malignant neoplasm of colon: Secondary | ICD-10-CM | POA: Diagnosis not present

## 2016-11-16 DIAGNOSIS — Z1212 Encounter for screening for malignant neoplasm of rectum: Secondary | ICD-10-CM | POA: Diagnosis not present

## 2016-11-16 DIAGNOSIS — Z01419 Encounter for gynecological examination (general) (routine) without abnormal findings: Secondary | ICD-10-CM | POA: Diagnosis not present

## 2016-11-16 DIAGNOSIS — N393 Stress incontinence (female) (male): Secondary | ICD-10-CM | POA: Diagnosis not present

## 2016-11-16 DIAGNOSIS — N921 Excessive and frequent menstruation with irregular cycle: Secondary | ICD-10-CM | POA: Diagnosis not present

## 2016-11-16 DIAGNOSIS — R102 Pelvic and perineal pain: Secondary | ICD-10-CM | POA: Diagnosis not present

## 2016-11-16 DIAGNOSIS — D252 Subserosal leiomyoma of uterus: Secondary | ICD-10-CM

## 2016-11-16 DIAGNOSIS — K219 Gastro-esophageal reflux disease without esophagitis: Secondary | ICD-10-CM

## 2016-11-16 DIAGNOSIS — L723 Sebaceous cyst: Secondary | ICD-10-CM

## 2016-11-16 DIAGNOSIS — I10 Essential (primary) hypertension: Secondary | ICD-10-CM | POA: Diagnosis not present

## 2016-11-16 LAB — HEMOCCULT GUIAC POC 1CARD (OFFICE): Fecal Occult Blood, POC: NEGATIVE

## 2016-11-16 MED ORDER — OMEPRAZOLE 20 MG PO CPDR: 20.0000 mg | DELAYED_RELEASE_CAPSULE | Freq: Every day | ORAL | 6 refills | Status: DC

## 2016-11-16 NOTE — Progress Notes (Signed)
Patient ID: Kelsey Cooke, female   DOB: 1970-10-08, 46 y.o.   MRN: [id number] History of Present Illness: Kelsey Cooke is a 46 year old white female, married in for a well woman gyn exam and pap.She is complaining of some pain LLQ and has skipped period for 3 months then heavy this month, and reflux. PCP is Dr Wolfgang Phoenix.   Current Medications, Allergies, Past Medical History, Past Surgical History, Family History and Social History were reviewed in [employer] record.     Review of Systems: Patient denies any headaches, hearing loss, fatigue, blurred vision, shortness of breath, chest pain, problems with bowel movements,or intercourse(has about once a month). No joint pain or mood swings. See HPI for positives.   Physical Exam:BP 120/80 (BP Location: Left Arm, Patient Position: Sitting, Cuff Size: Normal)   Pulse 60   Ht 5\' 5"  (1.651 m)   Wt 195 lb 8 oz (88.7 kg)   LMP 10/29/2016 (Exact Date)   BMI 32.53 kg/m  General:  Well developed, well nourished, no acute distress Skin:  Warm and dry Neck:  Midline trachea, slightly enlarged thyroid, good ROM, no lymphadenopathy Lungs; Clear to auscultation bilaterally Breast:  No dominant palpable mass, retraction, or nipple discharge,has 2 cm sebaceous cysts left breast at 9 o'clock 3 fb from nipple. Cardiovascular: Regular rate and rhythm Abdomen:  Soft, non tender, no hepatosplenomegaly Pelvic:  External genitalia is normal in appearance,has sebaceous cyst on mons pubis and left labia.  The vagina is normal in appearance. Urethra has no lesions or masses. The cervix is bulbous.Pap with HPV performed.  Uterus is felt to be normal size, shape, and contour,mildly tender.  No adnexal masses or tenderness noted.Bladder is non tender, no masses felt. Rectal: Good sphincter tone, no polyps, or hemorrhoids felt.  Hemoccult negative. Extremities/musculoskeletal:  No swelling or varicosities noted, no clubbing or cyanosis Psych:  No  mood changes, alert and cooperative,seems happy PHQ 2 score 0.She had [place] in June 2017 has fundal fibroid, could try IUD for bleeding and get to menopause. Try kegel's to see if helps.  Impression:  1. Encounter for gynecological examination with Papanicolaou smear of cervix   2. Pelvic pain in female   3. Screening for colorectal cancer   4. Menorrhagia with irregular cycle   5. SUI (stress urinary incontinence, female)   6. Gastroesophageal reflux disease, esophagitis presence not specified   7. Essential hypertension   8. Sebaceous cyst   9.       Fibroid   Plan: Meds ordered this encounter  Medications  . omeprazole (PRILOSEC) 20 MG capsule    Sig: Take 1 capsule (20 mg total) by mouth daily.    Dispense:  30 capsule    Refill:  6    Order Specific Question:   Supervising Provider    Answer:   Tania Ade H [[id number]]  If reflux persists refer to GI Continue Microzide, has refills Will check insurance for IUD Labs at work Mammogram due now and yearly Physical in 1 year Pap in 3 if normal Review handout on kegels

## 2016-11-16 NOTE — Patient Instructions (Signed)
Kegel Exercises Kegel exercises help strengthen the muscles that support the rectum, vagina, small intestine, bladder, and uterus. Doing Kegel exercises can help:  Improve bladder and bowel control.  Improve sexual response.  Reduce problems and discomfort during pregnancy. Kegel exercises involve squeezing your pelvic floor muscles, which are the same muscles you squeeze when you try to stop the flow of urine. The exercises can be done while sitting, standing, or lying down, but it is best to vary your position. Phase 1 exercises 1. Squeeze your pelvic floor muscles tight. You should feel a tight lift in your rectal area. If you are a female, you should also feel a tightness in your vaginal area. Keep your stomach, buttocks, and legs relaxed. 2. Hold the muscles tight for up to 10 seconds. 3. Relax your muscles. Repeat this exercise 50 times a day or as many times as told by your health care provider. Continue to do this exercise for at least 4-6 weeks or for as long as told by your health care provider. This information is not intended to replace advice given to you by your health care provider. Make sure you discuss any questions you have with your health care provider. Document Released: 08/02/2012 Document Revised: 04/10/2016 Document Reviewed: 07/06/2015 Elsevier Interactive Patient Education  2017 [employer].

## 2016-11-18 LAB — CYTOLOGY - PAP
Diagnosis: NEGATIVE
HPV: NOT DETECTED

## 2016-11-23 ENCOUNTER — Ambulatory Visit (INDEPENDENT_AMBULATORY_CARE_PROVIDER_SITE_OTHER): Payer: BLUE CROSS/BLUE SHIELD | Admitting: Family Medicine

## 2016-11-23 ENCOUNTER — Encounter: Payer: Self-pay | Admitting: Family Medicine

## 2016-11-23 VITALS — BP 122/80 | Ht 64.0 in | Wt 197.0 lb

## 2016-11-23 DIAGNOSIS — G8921 Chronic pain due to trauma: Secondary | ICD-10-CM

## 2016-11-23 DIAGNOSIS — R55 Syncope and collapse: Secondary | ICD-10-CM
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
## 2016-11-25 ENCOUNTER — Encounter: Payer: Self-pay | Admitting: Family Medicine

## 2016-11-30 ENCOUNTER — Encounter: Payer: Self-pay | Admitting: Neurology

## 2016-12-14 ENCOUNTER — Other Ambulatory Visit: Payer: Self-pay | Admitting: Adult Health

## 2016-12-14 DIAGNOSIS — Z1231 Encounter for screening mammogram for malignant neoplasm of breast: Secondary | ICD-10-CM

## 2016-12-20 DIAGNOSIS — M545 Low back pain: Secondary | ICD-10-CM | POA: Diagnosis not present

## 2016-12-20 DIAGNOSIS — M5136 Other intervertebral disc degeneration, lumbar region: Secondary | ICD-10-CM | POA: Diagnosis not present

## 2016-12-20 DIAGNOSIS — G8929 Other chronic pain: Secondary | ICD-10-CM | POA: Diagnosis not present

## 2016-12-20 DIAGNOSIS — M542 Cervicalgia: Secondary | ICD-10-CM | POA: Diagnosis not present

## 2016-12-23 ENCOUNTER — Ambulatory Visit (INDEPENDENT_AMBULATORY_CARE_PROVIDER_SITE_OTHER): Payer: BLUE CROSS/BLUE SHIELD | Admitting: Advanced Practice Midwife

## 2016-12-23 ENCOUNTER — Encounter: Payer: Self-pay | Admitting: Advanced Practice Midwife

## 2016-12-23 VITALS — BP 120/80 | HR 76 | Ht 65.2 in | Wt 196.3 lb

## 2016-12-23 DIAGNOSIS — Z30430 Encounter for insertion of intrauterine contraceptive device: Secondary | ICD-10-CM

## 2016-12-23 DIAGNOSIS — Z3202 Encounter for pregnancy test, result negative: Secondary | ICD-10-CM

## 2016-12-23 DIAGNOSIS — Z3046 Encounter for surveillance of implantable subdermal contraceptive: Secondary | ICD-10-CM

## 2016-12-24 DIAGNOSIS — Z30430 Encounter for insertion of intrauterine contraceptive device: Secondary | ICD-10-CM | POA: Insufficient documentation

## 2016-12-24 NOTE — Progress Notes (Signed)
Kelsey Cooke is a 46 y.o. year old  female   who presents for placement of a Mirena IUD for menorrhagia. Husband has vasectomy.her pregnancy test today is negative.    The risks and benefits of the method and placement have been thouroughly reviewed with the patient and all questions were answered.  Specifically the patient is aware of failure rate of 08/998, expulsion of the IUD and of possible perforation.  The patient is aware of irregular bleeding due to the method and understands the incidence of irregular bleeding diminishes with time.  Time out was performed.  A Graves speculum was placed.  The cervix was prepped using Betadine. The uterus was found to be anteroflexed and it sounded to 8 cm.  The cervix was grasped with a tenaculum and the IUD was inserted to 8 cm.  It was pulled back 1 cm and the IUD was disengaged.  The strings were trimmed to 3 cm.  Sonogram was performed and the proper placement of the IUD was verified.  The patient was instructed on signs and symptoms of infection and to check for the strings after each menses or each month.  The patient is to refrain from intercourse for 3 days.  The patient is scheduled for a return appointment after her first menses or 4 weeks.  CRESENZO-DISHMAN,Neidy Guerrieri 12/24/2016 8:53 AM

## 2016-12-27 ENCOUNTER — Ambulatory Visit (HOSPITAL_COMMUNITY)
Admission: RE | Admit: 2016-12-27 | Discharge: 2016-12-27 | Disposition: A | Discharge status: Home / Self Care | Payer: BLUE CROSS/BLUE SHIELD | Source: Ambulatory Visit | Attending: Adult Health | Admitting: Adult Health

## 2016-12-27 DIAGNOSIS — Z1231 Encounter for screening mammogram for malignant neoplasm of breast: Secondary | ICD-10-CM | POA: Insufficient documentation

## 2017-01-20 ENCOUNTER — Telehealth: Payer: Self-pay | Admitting: Family Medicine

## 2017-01-20 NOTE — Telephone Encounter (Signed)
With new insurance realities ALL docs are having a lot of trouble getting mri's ordered, this is why dr Aline Brochure insists we do it before he sees her. Ref chlor and ibu. I really would prefer pt stick with her specialiat, but if she swants to switchand dr Aline Brochure insisting we go through the mri process first, will need o v with me first for mri and I can not promise I will be more successfuo than bartko

## 2017-01-20 NOTE — Telephone Encounter (Signed)
Left message to return call 
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
## 2017-01-21 ENCOUNTER — Other Ambulatory Visit: Payer: Self-pay | Admitting: *Deleted

## 2017-01-21 MED ORDER — IBUPROFEN 800 MG PO TABS: 800.0000 mg | ORAL_TABLET | Freq: Three times a day (TID) | ORAL | 0 refills | Status: DC

## 2017-01-21 MED ORDER — CHLORZOXAZONE 500 MG PO TABS: 500.0000 mg | ORAL_TABLET | Freq: Three times a day (TID) | ORAL | 0 refills | Status: DC | PRN

## 2017-01-21 NOTE — Telephone Encounter (Signed)
Discussed with pt. Pt states she does not want to go back to dr Aline Brochure. She made appt with dr Richardson Landry to discuss getting MRI or being referred to someone else. meds sent to pharm.

## 2017-01-26 ENCOUNTER — Encounter: Payer: Self-pay | Admitting: Family Medicine

## 2017-01-26 ENCOUNTER — Ambulatory Visit (INDEPENDENT_AMBULATORY_CARE_PROVIDER_SITE_OTHER): Payer: BLUE CROSS/BLUE SHIELD | Admitting: Family Medicine

## 2017-01-26 VITALS — BP 136/88 | Ht 64.0 in | Wt 195.4 lb

## 2017-01-26 DIAGNOSIS — M792 Neuralgia and neuritis, unspecified: Secondary | ICD-10-CM

## 2017-01-26 DIAGNOSIS — S199XXD Unspecified injury of neck, subsequent encounter: Secondary | ICD-10-CM

## 2017-01-26 DIAGNOSIS — M79601 Pain in right arm: Secondary | ICD-10-CM | POA: Diagnosis not present
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
## 2017-01-27 ENCOUNTER — Telehealth: Payer: Self-pay | Admitting: Family Medicine

## 2017-01-27 NOTE — Telephone Encounter (Signed)
Need 01/26/17 OV note completed to use to obtain prior auth for cervical MRI that is scheduled for 02/02/17

## 2017-01-28 NOTE — Telephone Encounter (Signed)
Did it yest morn, I think complete

## 2017-01-28 NOTE — Telephone Encounter (Signed)
Dr. Richardson Landry I spoke with IT, they state that when dictating & signing the [place] note you must have accidentally clicked a "blue padlock" that marked the OV note sensitive  They state in order for me to be able to see the note that you need to open the encounter, addend the note & mark it unsensitive, sign the OV note and then I should be able to see it

## 2017-01-28 NOTE — Telephone Encounter (Signed)
OV note states I do not have access Called Help Desk to have IT look into it, waiting a call back

## 2017-02-02 ENCOUNTER — Ambulatory Visit (HOSPITAL_COMMUNITY)
Admission: RE | Admit: 2017-02-02 | Discharge: 2017-02-02 | Disposition: A | Discharge status: Home / Self Care | Payer: BLUE CROSS/BLUE SHIELD | Source: Ambulatory Visit | Attending: Family Medicine | Admitting: Family Medicine

## 2017-02-02 DIAGNOSIS — M792 Neuralgia and neuritis, unspecified: Secondary | ICD-10-CM | POA: Insufficient documentation

## 2017-02-02 DIAGNOSIS — M79601 Pain in right arm: Secondary | ICD-10-CM | POA: Insufficient documentation

## 2017-02-02 DIAGNOSIS — S199XXD Unspecified injury of neck, subsequent encounter: Secondary | ICD-10-CM | POA: Diagnosis not present

## 2017-02-02 DIAGNOSIS — R221 Localized swelling, mass and lump, neck: Secondary | ICD-10-CM | POA: Insufficient documentation

## 2017-02-02 DIAGNOSIS — M8938 Hypertrophy of bone, other site: Secondary | ICD-10-CM | POA: Insufficient documentation

## 2017-02-02 DIAGNOSIS — M542 Cervicalgia: Secondary | ICD-10-CM | POA: Diagnosis not present

## 2017-02-03 ENCOUNTER — Ambulatory Visit (INDEPENDENT_AMBULATORY_CARE_PROVIDER_SITE_OTHER): Payer: BLUE CROSS/BLUE SHIELD | Admitting: Neurology

## 2017-02-03 ENCOUNTER — Encounter: Payer: Self-pay | Admitting: Neurology

## 2017-02-03 VITALS — BP 138/72 | HR 80 | Ht 65.0 in | Wt 193.0 lb

## 2017-02-03 DIAGNOSIS — G8929 Other chronic pain: Secondary | ICD-10-CM | POA: Insufficient documentation

## 2017-02-03 DIAGNOSIS — M5412 Radiculopathy, cervical region: Secondary | ICD-10-CM | POA: Insufficient documentation

## 2017-02-03 DIAGNOSIS — M5440 Lumbago with sciatica, unspecified side: Secondary | ICD-10-CM | POA: Diagnosis not present

## 2017-02-03 DIAGNOSIS — M542 Cervicalgia: Secondary | ICD-10-CM | POA: Diagnosis not present

## 2017-02-03 NOTE — Patient Instructions (Addendum)
1. Schedule EMG/NCV of both UE with Dr. Posey Pronto 2. Refer to PT at [facility] for neck and back pain 3. Follow-up in 4 months, call for any changes
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
## 2017-02-09 ENCOUNTER — Encounter (HOSPITAL_COMMUNITY): Payer: Self-pay

## 2017-02-09 ENCOUNTER — Telehealth: Payer: Self-pay | Admitting: Family Medicine

## 2017-02-09 ENCOUNTER — Encounter: Payer: Self-pay | Admitting: Family Medicine

## 2017-02-09 ENCOUNTER — Ambulatory Visit (INDEPENDENT_AMBULATORY_CARE_PROVIDER_SITE_OTHER): Payer: BLUE CROSS/BLUE SHIELD | Admitting: Family Medicine

## 2017-02-09 ENCOUNTER — Ambulatory Visit (HOSPITAL_COMMUNITY): Payer: BLUE CROSS/BLUE SHIELD | Attending: Neurology

## 2017-02-09 VITALS — BP 118/80 | Ht 65.0 in | Wt 194.0 lb

## 2017-02-09 DIAGNOSIS — M792 Neuralgia and neuritis, unspecified: Secondary | ICD-10-CM

## 2017-02-09 DIAGNOSIS — M542 Cervicalgia: Secondary | ICD-10-CM

## 2017-02-09 DIAGNOSIS — S199XXD Unspecified injury of neck, subsequent encounter: Secondary | ICD-10-CM

## 2017-02-09 DIAGNOSIS — R29898 Other symptoms and signs involving the musculoskeletal system: Secondary | ICD-10-CM

## 2017-02-09 DIAGNOSIS — M545 Low back pain, unspecified: Secondary | ICD-10-CM

## 2017-02-09 DIAGNOSIS — G8921 Chronic pain due to trauma: Secondary | ICD-10-CM

## 2017-02-09 DIAGNOSIS — G8929 Other chronic pain: Secondary | ICD-10-CM | POA: Diagnosis not present

## 2017-02-09 DIAGNOSIS — M6281 Muscle weakness (generalized): Secondary | ICD-10-CM | POA: Insufficient documentation

## 2017-02-09 MED ORDER — PREGABALIN 50 MG PO CAPS: ORAL_CAPSULE | ORAL | 2 refills | Status: DC
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
## 2017-02-09 NOTE — Patient Instructions (Signed)
  Thomas Test Position Hip Flexor Stretch  Sit on the edge of a bed, grab one knee and lie back onto the bed in the position as shown in the picture.    The leg that is hanging over the edge should be the leg you are stretching.  Hold your opposite knee tight toward your chest to stabilize your spine.   Perform 3-5 stretches, holding for 15-30 seconds   PIRIFORMIS STRETCH - MODIFIED  While lying on your back, hold your knee with your opposite hand and draw your knee up and over towards your opposite shoulder.  Perform 3-5 stretches, holding for 15-30 seconds   HAMSTRING STRETCH WITH TOWEL  While lying down on your back, hook a towel or strap under  your foot and draw up your leg until a stretch is felt under your leg. calf area.  Keep your knee in a straightened position during the stretch.  Perform 3-5 stretches, holding for 15-30 seconds

## 2017-02-09 NOTE — Telephone Encounter (Signed)
Left message return call 02/09/2017

## 2017-02-09 NOTE — Telephone Encounter (Signed)
Patient wanting to know about her thyroid which she thought you were going to go over at visit this morning from her MRI test ,she was wondering if everything was ok.

## 2017-02-10 ENCOUNTER — Other Ambulatory Visit: Payer: Self-pay | Admitting: *Deleted

## 2017-02-10 DIAGNOSIS — M7989 Other specified soft tissue disorders: Secondary | ICD-10-CM

## 2017-02-10 DIAGNOSIS — Z79899 Other long term (current) drug therapy: Secondary | ICD-10-CM

## 2017-02-10 DIAGNOSIS — E041 Nontoxic single thyroid nodule: Secondary | ICD-10-CM

## 2017-02-10 DIAGNOSIS — R221 Localized swelling, mass and lump, neck: Secondary | ICD-10-CM

## 2017-02-10 NOTE — Telephone Encounter (Signed)
Discussed with pt. Pt verbalized understanding. Ct scheduled June 26th at 3 pm. Only liquids 4 hours prior to test. Pt also needs to have bmp since test is with contrast. Order put in. Pt notified to do bloodwork at least 3 days before scan.

## 2017-02-21 ENCOUNTER — Ambulatory Visit (HOSPITAL_COMMUNITY): Payer: BLUE CROSS/BLUE SHIELD | Admitting: Physical Therapy

## 2017-02-21 DIAGNOSIS — M545 Low back pain, unspecified: Secondary | ICD-10-CM

## 2017-02-21 DIAGNOSIS — M542 Cervicalgia: Secondary | ICD-10-CM

## 2017-02-21 DIAGNOSIS — R29898 Other symptoms and signs involving the musculoskeletal system: Secondary | ICD-10-CM | POA: Diagnosis not present

## 2017-02-21 DIAGNOSIS — M6281 Muscle weakness (generalized): Secondary | ICD-10-CM

## 2017-02-21 DIAGNOSIS — G8929 Other chronic pain: Secondary | ICD-10-CM | POA: Diagnosis not present
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
## 2017-02-22 ENCOUNTER — Ambulatory Visit (HOSPITAL_COMMUNITY)
Admission: RE | Admit: 2017-02-22 | Discharge: 2017-02-22 | Disposition: A | Discharge status: Home / Self Care | Payer: BLUE CROSS/BLUE SHIELD | Source: Ambulatory Visit | Attending: Family Medicine | Admitting: Family Medicine

## 2017-02-22 DIAGNOSIS — E048 Other specified nontoxic goiter: Secondary | ICD-10-CM | POA: Diagnosis not present

## 2017-02-22 DIAGNOSIS — M7989 Other specified soft tissue disorders: Secondary | ICD-10-CM | POA: Insufficient documentation

## 2017-02-22 MED ORDER — IOPAMIDOL (ISOVUE-300) INJECTION 61%
75.0000 mL | Freq: Once | INTRAVENOUS | Status: AC | PRN
  Administered 2017-02-22: 75 mL via INTRAVENOUS

## 2017-02-28 ENCOUNTER — Ambulatory Visit (INDEPENDENT_AMBULATORY_CARE_PROVIDER_SITE_OTHER): Payer: BLUE CROSS/BLUE SHIELD | Admitting: Otolaryngology

## 2017-02-28 ENCOUNTER — Ambulatory Visit (HOSPITAL_COMMUNITY): Payer: BLUE CROSS/BLUE SHIELD | Attending: Neurology | Admitting: Physical Therapy

## 2017-02-28 DIAGNOSIS — M545 Low back pain, unspecified: Secondary | ICD-10-CM

## 2017-02-28 DIAGNOSIS — R29898 Other symptoms and signs involving the musculoskeletal system: Secondary | ICD-10-CM | POA: Insufficient documentation

## 2017-02-28 DIAGNOSIS — D440 Neoplasm of uncertain behavior of thyroid gland: Secondary | ICD-10-CM

## 2017-02-28 DIAGNOSIS — G8929 Other chronic pain: Secondary | ICD-10-CM | POA: Diagnosis not present

## 2017-02-28 DIAGNOSIS — M6281 Muscle weakness (generalized): Secondary | ICD-10-CM | POA: Diagnosis not present

## 2017-02-28 DIAGNOSIS — M542 Cervicalgia: Secondary | ICD-10-CM | POA: Diagnosis not present
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
## 2017-03-01 ENCOUNTER — Ambulatory Visit (INDEPENDENT_AMBULATORY_CARE_PROVIDER_SITE_OTHER): Payer: BLUE CROSS/BLUE SHIELD | Admitting: Neurology

## 2017-03-01 DIAGNOSIS — M5412 Radiculopathy, cervical region: Secondary | ICD-10-CM

## 2017-03-01 DIAGNOSIS — M542 Cervicalgia: Secondary | ICD-10-CM | POA: Diagnosis not present

## 2017-03-01 DIAGNOSIS — Z029 Encounter for administrative examinations, unspecified: Secondary | ICD-10-CM

## 2017-03-01 DIAGNOSIS — M5440 Lumbago with sciatica, unspecified side: Secondary | ICD-10-CM

## 2017-03-01 DIAGNOSIS — G8929 Other chronic pain: Secondary | ICD-10-CM

## 2017-03-01 NOTE — Procedures (Signed)
[facility]  [place], [place]  [place], [place] [address] Tel: [phone] Fax:  [phone] Test Date:  03/01/2017  Patient: Kelsey Cooke DOB: April 14, 1971 Physician: Narda Amber, DO  Sex: Female Height: 5\' 5"  Ref Phys: Ellouise Newer, M.D.  ID#: [id number] Temp: 32.8C Technician:    Patient Complaints: This is a 46 year-old female referred for evaluation of neck pain and shooting pain into the arms.  NCV & EMG Findings: Extensive diagnostic testing the right upper extremity and additional studies of the left shows:  1. Bilateral median, ulnar, mixed palmer sensory responses are within normal limits. 2. Bilateral median and ulnar motor responses are within normal limits. 3. There is no evidence of active or chronic motor axon loss changes affecting the tested muscles. Motor unit configuration and recruitment pattern is within normal limits.  Impression: This is a normal study of the upper extremities. In particular, there is no evidence of a cervical radiculopathy or carpal tunnel syndrome.   ___________________________ Narda Amber, DO    Nerve Conduction Studies Anti Sensory Summary Table   Site NR Peak (ms) Norm Peak (ms) P-T Amp (V) Norm P-T Amp  Left Median Anti Sensory (2nd Digit)  32.8C  Wrist    2.6 <3.4 49.9 >20  Right Median Anti Sensory (2nd Digit)  32.8C  Wrist    2.5 <3.4 51.0 >20  Left Ulnar Anti Sensory (5th Digit)  32.8C  Wrist    2.2 <3.1 34.2 >12  Right Ulnar Anti Sensory (5th Digit)  32.8C  Wrist    2.3 <3.1 43.7 >12   Motor Summary Table   Site NR Onset (ms) Norm Onset (ms) O-P Amp (mV) Norm O-P Amp Site1 Site2 Delta-0 (ms) Dist (cm) Vel (m/s) Norm Vel (m/s)  Left Median Motor (Abd Poll Brev)  32.8C  Wrist    2.7 <3.9 10.8 >6 Elbow Wrist 3.8 25.0 66 >50  Elbow    6.5  9.3         Right Median Motor (Abd Poll Brev)  32.8C  Wrist    2.3 <3.9 9.6 >6 Elbow Wrist 4.3 28.0 65 >50  Elbow    6.6  9.3         Left Ulnar Motor  (Abd Dig Minimi)  32.8C  Wrist    2.0 <3.1 12.9 >7 B Elbow Wrist 3.2 22.0 69 >50  B Elbow    5.2  12.2  A Elbow B Elbow 1.4 10.0 71 >50  A Elbow    6.6  11.7         Right Ulnar Motor (Abd Dig Minimi)  32.8C  Wrist    2.2 <3.1 10.1 >7 B Elbow Wrist 3.4 24.0 71 >50  B Elbow    5.6  9.3  A Elbow B Elbow 1.5 10.0 67 >50  A Elbow    7.1  8.9          Comparison Summary Table   Site NR Peak (ms) Norm Peak (ms) P-T Amp (V) Site1 Site2 Delta-P (ms) Norm Delta (ms)  Left Median/Ulnar Palm Comparison (Wrist - 8cm)  32.8C  Median Palm    1.4 <2.2 68.0 Median Palm Ulnar Palm 0.0   Ulnar Palm    1.4 <2.2 34.3      Right Median/Ulnar Palm Comparison (Wrist - 8cm)  32.8C  Median Palm    1.4 <2.2 28.8 Median Palm Ulnar Palm 0.1   Ulnar Palm    1.3 <2.2 21.1  EMG   Side Muscle Ins Act Fibs Psw Fasc Number Recrt Dur Dur. Amp Amp. Poly Poly. Comment  Right 1stDorInt Nml Nml Nml Nml Nml Nml Nml Nml Nml Nml Nml Nml N/A  Right Ext Indicis Nml Nml Nml Nml Nml Nml Nml Nml Nml Nml Nml Nml N/A  Right PronatorTeres Nml Nml Nml Nml Nml Nml Nml Nml Nml Nml Nml Nml N/A  Right Biceps Nml Nml Nml Nml Nml Nml Nml Nml Nml Nml Nml Nml N/A  Right Triceps Nml Nml Nml Nml Nml Nml Nml Nml Nml Nml Nml Nml N/A  Right Deltoid Nml Nml Nml Nml Nml Nml Nml Nml Nml Nml Nml Nml N/A  Right Cervical Parasp Low Nml Nml Nml Nml NE - - - - - - - N/A  Left 1stDorInt Nml Nml Nml Nml Nml Nml Nml Nml Nml Nml Nml Nml N/A  Left PronatorTeres Nml Nml Nml Nml Nml Nml Nml Nml Nml Nml Nml Nml N/A  Left Biceps Nml Nml Nml Nml Nml Nml Nml Nml Nml Nml Nml Nml N/A  Left Triceps Nml Nml Nml Nml Nml Nml Nml Nml Nml Nml Nml Nml N/A  Left Deltoid Nml Nml Nml Nml Nml Nml Nml Nml Nml Nml Nml Nml N/A      Waveforms:

## 2017-03-03 ENCOUNTER — Telehealth: Payer: Self-pay

## 2017-03-03 NOTE — Telephone Encounter (Signed)
Spoke with pt relaying message below.  Pt appreciative.  

## 2017-03-03 NOTE — Telephone Encounter (Signed)
-----   Message from Cameron Sprang, MD sent at 03/03/2017  8:51 AM EDT ----- Pls let her know the nerve test was normal, no evidence of nerve injury or pinched nerve. Her symptoms are most likely due to muscle spasms, continue with PT. Thanks

## 2017-03-04 ENCOUNTER — Encounter (HOSPITAL_COMMUNITY): Payer: Self-pay

## 2017-03-04 ENCOUNTER — Ambulatory Visit (HOSPITAL_COMMUNITY): Payer: BLUE CROSS/BLUE SHIELD

## 2017-03-04 ENCOUNTER — Telehealth: Payer: Self-pay | Admitting: Family Medicine

## 2017-03-04 DIAGNOSIS — G8929 Other chronic pain: Secondary | ICD-10-CM | POA: Diagnosis not present

## 2017-03-04 DIAGNOSIS — M545 Low back pain, unspecified: Secondary | ICD-10-CM

## 2017-03-04 DIAGNOSIS — M542 Cervicalgia: Secondary | ICD-10-CM

## 2017-03-04 DIAGNOSIS — R29898 Other symptoms and signs involving the musculoskeletal system: Secondary | ICD-10-CM | POA: Diagnosis not present

## 2017-03-04 DIAGNOSIS — M6281 Muscle weakness (generalized): Secondary | ICD-10-CM
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
## 2017-03-04 NOTE — Telephone Encounter (Signed)
Patient had faxed over detailed FMLA from her job to be filled out. Please review,fill in highlighted areas. Date sign where flags are please. Form in yellow folder in office.

## 2017-03-08 ENCOUNTER — Ambulatory Visit (HOSPITAL_COMMUNITY): Payer: BLUE CROSS/BLUE SHIELD

## 2017-03-08 ENCOUNTER — Encounter (HOSPITAL_COMMUNITY): Payer: Self-pay

## 2017-03-08 DIAGNOSIS — M545 Low back pain, unspecified: Secondary | ICD-10-CM

## 2017-03-08 DIAGNOSIS — R29898 Other symptoms and signs involving the musculoskeletal system: Secondary | ICD-10-CM | POA: Diagnosis not present

## 2017-03-08 DIAGNOSIS — M6281 Muscle weakness (generalized): Secondary | ICD-10-CM | POA: Diagnosis not present

## 2017-03-08 DIAGNOSIS — G8929 Other chronic pain: Secondary | ICD-10-CM

## 2017-03-08 DIAGNOSIS — M542 Cervicalgia: Secondary | ICD-10-CM
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
## 2017-03-09 DIAGNOSIS — E049 Nontoxic goiter, unspecified: Secondary | ICD-10-CM | POA: Diagnosis not present

## 2017-03-10 ENCOUNTER — Ambulatory Visit (HOSPITAL_COMMUNITY): Payer: BLUE CROSS/BLUE SHIELD | Admitting: Physical Therapy

## 2017-03-10 DIAGNOSIS — M542 Cervicalgia: Secondary | ICD-10-CM | POA: Diagnosis not present

## 2017-03-10 DIAGNOSIS — G8929 Other chronic pain: Secondary | ICD-10-CM

## 2017-03-10 DIAGNOSIS — R29898 Other symptoms and signs involving the musculoskeletal system: Secondary | ICD-10-CM

## 2017-03-10 DIAGNOSIS — M545 Low back pain, unspecified: Secondary | ICD-10-CM

## 2017-03-10 DIAGNOSIS — M6281 Muscle weakness (generalized): Secondary | ICD-10-CM
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
## 2017-03-15 ENCOUNTER — Ambulatory Visit (HOSPITAL_COMMUNITY): Payer: BLUE CROSS/BLUE SHIELD | Admitting: Physical Therapy

## 2017-03-15 DIAGNOSIS — M545 Low back pain, unspecified: Secondary | ICD-10-CM

## 2017-03-15 DIAGNOSIS — G8929 Other chronic pain: Secondary | ICD-10-CM | POA: Diagnosis not present

## 2017-03-15 DIAGNOSIS — R29898 Other symptoms and signs involving the musculoskeletal system: Secondary | ICD-10-CM | POA: Diagnosis not present

## 2017-03-15 DIAGNOSIS — M542 Cervicalgia: Secondary | ICD-10-CM | POA: Diagnosis not present

## 2017-03-15 DIAGNOSIS — M6281 Muscle weakness (generalized): Secondary | ICD-10-CM
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
## 2017-03-17 ENCOUNTER — Ambulatory Visit (HOSPITAL_COMMUNITY): Payer: BLUE CROSS/BLUE SHIELD | Admitting: Physical Therapy

## 2017-03-17 DIAGNOSIS — R29898 Other symptoms and signs involving the musculoskeletal system: Secondary | ICD-10-CM | POA: Diagnosis not present

## 2017-03-17 DIAGNOSIS — G8929 Other chronic pain: Secondary | ICD-10-CM

## 2017-03-17 DIAGNOSIS — M6281 Muscle weakness (generalized): Secondary | ICD-10-CM

## 2017-03-17 DIAGNOSIS — M545 Low back pain, unspecified: Secondary | ICD-10-CM

## 2017-03-17 DIAGNOSIS — M542 Cervicalgia: Secondary | ICD-10-CM
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
## 2017-03-22 ENCOUNTER — Ambulatory Visit (HOSPITAL_COMMUNITY): Payer: BLUE CROSS/BLUE SHIELD

## 2017-03-22 ENCOUNTER — Encounter (HOSPITAL_COMMUNITY): Payer: Self-pay

## 2017-03-22 DIAGNOSIS — M545 Low back pain, unspecified: Secondary | ICD-10-CM

## 2017-03-22 DIAGNOSIS — M542 Cervicalgia: Secondary | ICD-10-CM

## 2017-03-22 DIAGNOSIS — M6281 Muscle weakness (generalized): Secondary | ICD-10-CM

## 2017-03-22 DIAGNOSIS — G8929 Other chronic pain: Secondary | ICD-10-CM

## 2017-03-22 DIAGNOSIS — R29898 Other symptoms and signs involving the musculoskeletal system: Secondary | ICD-10-CM
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
## 2017-03-24 ENCOUNTER — Encounter: Payer: Self-pay | Admitting: Family Medicine

## 2017-03-24 ENCOUNTER — Ambulatory Visit (INDEPENDENT_AMBULATORY_CARE_PROVIDER_SITE_OTHER): Payer: BLUE CROSS/BLUE SHIELD | Admitting: Family Medicine

## 2017-03-24 VITALS — BP 122/82 | Ht 65.0 in | Wt 196.6 lb

## 2017-03-24 DIAGNOSIS — M542 Cervicalgia: Secondary | ICD-10-CM | POA: Diagnosis not present

## 2017-03-24 DIAGNOSIS — Z029 Encounter for administrative examinations, unspecified: Secondary | ICD-10-CM

## 2017-03-24 DIAGNOSIS — G8921 Chronic pain due to trauma: Secondary | ICD-10-CM

## 2017-03-24 MED ORDER — CHLORZOXAZONE 500 MG PO TABS: 500.0000 mg | ORAL_TABLET | Freq: Three times a day (TID) | ORAL | 2 refills | Status: DC | PRN
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
## 2017-03-25 ENCOUNTER — Ambulatory Visit (HOSPITAL_COMMUNITY): Payer: BLUE CROSS/BLUE SHIELD | Admitting: Physical Therapy

## 2017-03-25 DIAGNOSIS — R29898 Other symptoms and signs involving the musculoskeletal system: Secondary | ICD-10-CM | POA: Diagnosis not present

## 2017-03-25 DIAGNOSIS — M542 Cervicalgia: Secondary | ICD-10-CM | POA: Diagnosis not present

## 2017-03-25 DIAGNOSIS — M6281 Muscle weakness (generalized): Secondary | ICD-10-CM

## 2017-03-25 DIAGNOSIS — M545 Low back pain, unspecified: Secondary | ICD-10-CM

## 2017-03-25 DIAGNOSIS — G8929 Other chronic pain: Secondary | ICD-10-CM

## 2017-03-25 NOTE — Patient Instructions (Signed)
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
## 2017-03-29 ENCOUNTER — Ambulatory Visit (HOSPITAL_COMMUNITY): Payer: BLUE CROSS/BLUE SHIELD

## 2017-03-29 ENCOUNTER — Encounter (HOSPITAL_COMMUNITY): Payer: Self-pay

## 2017-03-29 DIAGNOSIS — R29898 Other symptoms and signs involving the musculoskeletal system: Secondary | ICD-10-CM

## 2017-03-29 DIAGNOSIS — M545 Low back pain, unspecified: Secondary | ICD-10-CM

## 2017-03-29 DIAGNOSIS — M6281 Muscle weakness (generalized): Secondary | ICD-10-CM

## 2017-03-29 DIAGNOSIS — G8929 Other chronic pain: Secondary | ICD-10-CM

## 2017-03-29 DIAGNOSIS — M542 Cervicalgia: Secondary | ICD-10-CM
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
## 2017-03-31 ENCOUNTER — Ambulatory Visit (HOSPITAL_COMMUNITY): Payer: BLUE CROSS/BLUE SHIELD | Attending: Neurology | Admitting: Physical Therapy

## 2017-03-31 DIAGNOSIS — R29898 Other symptoms and signs involving the musculoskeletal system: Secondary | ICD-10-CM

## 2017-03-31 DIAGNOSIS — M542 Cervicalgia: Secondary | ICD-10-CM | POA: Diagnosis not present

## 2017-03-31 DIAGNOSIS — M6281 Muscle weakness (generalized): Secondary | ICD-10-CM | POA: Diagnosis not present

## 2017-03-31 DIAGNOSIS — M545 Low back pain, unspecified: Secondary | ICD-10-CM

## 2017-03-31 DIAGNOSIS — G8929 Other chronic pain: Secondary | ICD-10-CM | POA: Diagnosis not present
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
## 2017-04-05 ENCOUNTER — Encounter (HOSPITAL_COMMUNITY): Payer: Self-pay

## 2017-04-05 ENCOUNTER — Ambulatory Visit (HOSPITAL_COMMUNITY): Payer: BLUE CROSS/BLUE SHIELD

## 2017-04-05 DIAGNOSIS — M542 Cervicalgia: Secondary | ICD-10-CM

## 2017-04-05 DIAGNOSIS — M545 Low back pain, unspecified: Secondary | ICD-10-CM

## 2017-04-05 DIAGNOSIS — M6281 Muscle weakness (generalized): Secondary | ICD-10-CM | POA: Diagnosis not present

## 2017-04-05 DIAGNOSIS — G8929 Other chronic pain: Secondary | ICD-10-CM | POA: Diagnosis not present

## 2017-04-05 DIAGNOSIS — R29898 Other symptoms and signs involving the musculoskeletal system: Secondary | ICD-10-CM
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
## 2017-04-07 ENCOUNTER — Ambulatory Visit (HOSPITAL_COMMUNITY): Payer: BLUE CROSS/BLUE SHIELD

## 2017-04-07 ENCOUNTER — Encounter (HOSPITAL_COMMUNITY): Payer: Self-pay

## 2017-04-07 DIAGNOSIS — G8929 Other chronic pain: Secondary | ICD-10-CM

## 2017-04-07 DIAGNOSIS — M6281 Muscle weakness (generalized): Secondary | ICD-10-CM

## 2017-04-07 DIAGNOSIS — M545 Low back pain, unspecified: Secondary | ICD-10-CM

## 2017-04-07 DIAGNOSIS — R29898 Other symptoms and signs involving the musculoskeletal system: Secondary | ICD-10-CM | POA: Diagnosis not present

## 2017-04-07 DIAGNOSIS — M542 Cervicalgia: Secondary | ICD-10-CM | POA: Diagnosis not present
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
## 2017-04-11 ENCOUNTER — Telehealth: Payer: Self-pay | Admitting: Family Medicine

## 2017-04-11 NOTE — Telephone Encounter (Signed)
Review blood work results faxed from [facility] [place].

## 2017-04-12 DIAGNOSIS — E042 Nontoxic multinodular goiter: Secondary | ICD-10-CM | POA: Diagnosis not present

## 2017-04-12 DIAGNOSIS — I10 Essential (primary) hypertension: Secondary | ICD-10-CM | POA: Diagnosis not present

## 2017-04-12 DIAGNOSIS — Z791 Long term (current) use of non-steroidal anti-inflammatories (NSAID): Secondary | ICD-10-CM | POA: Diagnosis not present

## 2017-04-12 DIAGNOSIS — E213 Hyperparathyroidism, unspecified: Secondary | ICD-10-CM | POA: Diagnosis not present

## 2017-04-12 DIAGNOSIS — E079 Disorder of thyroid, unspecified: Secondary | ICD-10-CM | POA: Diagnosis not present

## 2017-04-12 DIAGNOSIS — Z79899 Other long term (current) drug therapy: Secondary | ICD-10-CM | POA: Diagnosis not present

## 2017-04-12 DIAGNOSIS — Z87891 Personal history of nicotine dependence: Secondary | ICD-10-CM | POA: Diagnosis not present

## 2017-04-12 DIAGNOSIS — K219 Gastro-esophageal reflux disease without esophagitis: Secondary | ICD-10-CM | POA: Diagnosis not present

## 2017-04-13 DIAGNOSIS — Z87891 Personal history of nicotine dependence: Secondary | ICD-10-CM | POA: Diagnosis not present

## 2017-04-13 DIAGNOSIS — E042 Nontoxic multinodular goiter: Secondary | ICD-10-CM | POA: Diagnosis not present

## 2017-04-13 DIAGNOSIS — I10 Essential (primary) hypertension: Secondary | ICD-10-CM | POA: Diagnosis not present

## 2017-04-13 DIAGNOSIS — Z79899 Other long term (current) drug therapy: Secondary | ICD-10-CM | POA: Diagnosis not present

## 2017-04-13 DIAGNOSIS — K219 Gastro-esophageal reflux disease without esophagitis: Secondary | ICD-10-CM | POA: Diagnosis not present

## 2017-04-13 DIAGNOSIS — E213 Hyperparathyroidism, unspecified: Secondary | ICD-10-CM | POA: Diagnosis not present

## 2017-04-13 DIAGNOSIS — Z791 Long term (current) use of non-steroidal anti-inflammatories (NSAID): Secondary | ICD-10-CM | POA: Diagnosis not present

## 2017-04-19 ENCOUNTER — Telehealth (HOSPITAL_COMMUNITY): Payer: Self-pay | Admitting: Family Medicine

## 2017-04-19 ENCOUNTER — Ambulatory Visit (HOSPITAL_COMMUNITY): Payer: BLUE CROSS/BLUE SHIELD | Admitting: Physical Therapy

## 2017-04-19 NOTE — Telephone Encounter (Signed)
04/19/17  patient left a message that she wanted to cx this appt.

## 2017-04-20 DIAGNOSIS — E213 Hyperparathyroidism, unspecified: Secondary | ICD-10-CM | POA: Diagnosis not present

## 2017-04-21 ENCOUNTER — Ambulatory Visit (HOSPITAL_COMMUNITY): Payer: BLUE CROSS/BLUE SHIELD | Admitting: Physical Therapy

## 2017-04-21 DIAGNOSIS — M6281 Muscle weakness (generalized): Secondary | ICD-10-CM | POA: Diagnosis not present

## 2017-04-21 DIAGNOSIS — G8929 Other chronic pain: Secondary | ICD-10-CM | POA: Diagnosis not present

## 2017-04-21 DIAGNOSIS — M545 Low back pain, unspecified: Secondary | ICD-10-CM

## 2017-04-21 DIAGNOSIS — R29898 Other symptoms and signs involving the musculoskeletal system: Secondary | ICD-10-CM | POA: Diagnosis not present

## 2017-04-21 DIAGNOSIS — M542 Cervicalgia: Secondary | ICD-10-CM
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
## 2017-04-26 ENCOUNTER — Ambulatory Visit (HOSPITAL_COMMUNITY): Payer: BLUE CROSS/BLUE SHIELD

## 2017-04-26 ENCOUNTER — Encounter (HOSPITAL_COMMUNITY): Payer: Self-pay

## 2017-04-26 DIAGNOSIS — M545 Low back pain, unspecified: Secondary | ICD-10-CM

## 2017-04-26 DIAGNOSIS — R29898 Other symptoms and signs involving the musculoskeletal system: Secondary | ICD-10-CM | POA: Diagnosis not present

## 2017-04-26 DIAGNOSIS — G8929 Other chronic pain: Secondary | ICD-10-CM | POA: Diagnosis not present

## 2017-04-26 DIAGNOSIS — M542 Cervicalgia: Secondary | ICD-10-CM | POA: Diagnosis not present

## 2017-04-26 DIAGNOSIS — M6281 Muscle weakness (generalized): Secondary | ICD-10-CM

## 2017-04-26 NOTE — Patient Instructions (Signed)
  Scalene Towel Stretch  Hold towel under right armpit. Pull up behind back and around over left shoulder. Pull towel down and across towards right hip. Should feel stretch in neck. To increase stretch look up and tilt head slightly towards left shoulder.   Add in with your other exercises -- perform 3-5 stretches holding for 10-15 seconds each (can do more if it is making you feel better).

## 2017-04-26 NOTE — Addendum Note (Signed)
Addended by: Geraldine Solar E on: 04/26/2017 08:00 AM   Modules accepted: Orders
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
## 2017-04-28 ENCOUNTER — Ambulatory Visit (HOSPITAL_COMMUNITY): Payer: BLUE CROSS/BLUE SHIELD | Admitting: Physical Therapy

## 2017-04-28 DIAGNOSIS — M542 Cervicalgia: Secondary | ICD-10-CM | POA: Diagnosis not present

## 2017-04-28 DIAGNOSIS — M6281 Muscle weakness (generalized): Secondary | ICD-10-CM | POA: Diagnosis not present

## 2017-04-28 DIAGNOSIS — M545 Low back pain, unspecified: Secondary | ICD-10-CM

## 2017-04-28 DIAGNOSIS — G8929 Other chronic pain: Secondary | ICD-10-CM | POA: Diagnosis not present

## 2017-04-28 DIAGNOSIS — R29898 Other symptoms and signs involving the musculoskeletal system: Secondary | ICD-10-CM | POA: Diagnosis not present
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
## 2017-05-04 ENCOUNTER — Ambulatory Visit (HOSPITAL_COMMUNITY): Payer: BLUE CROSS/BLUE SHIELD | Attending: Neurology

## 2017-05-04 ENCOUNTER — Encounter (HOSPITAL_COMMUNITY): Payer: Self-pay

## 2017-05-04 DIAGNOSIS — G8929 Other chronic pain: Secondary | ICD-10-CM | POA: Diagnosis not present

## 2017-05-04 DIAGNOSIS — M545 Low back pain, unspecified: Secondary | ICD-10-CM

## 2017-05-04 DIAGNOSIS — M6281 Muscle weakness (generalized): Secondary | ICD-10-CM | POA: Diagnosis not present

## 2017-05-04 DIAGNOSIS — R29898 Other symptoms and signs involving the musculoskeletal system: Secondary | ICD-10-CM

## 2017-05-04 DIAGNOSIS — M542 Cervicalgia: Secondary | ICD-10-CM | POA: Insufficient documentation
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
## 2017-05-11 DIAGNOSIS — Z029 Encounter for administrative examinations, unspecified: Secondary | ICD-10-CM

## 2017-05-24 DIAGNOSIS — Z029 Encounter for administrative examinations, unspecified: Secondary | ICD-10-CM

## 2017-06-14 ENCOUNTER — Ambulatory Visit: Payer: BLUE CROSS/BLUE SHIELD | Admitting: Neurology

## 2017-06-20 ENCOUNTER — Ambulatory Visit (INDEPENDENT_AMBULATORY_CARE_PROVIDER_SITE_OTHER): Payer: BLUE CROSS/BLUE SHIELD | Admitting: Family Medicine

## 2017-06-20 ENCOUNTER — Encounter: Payer: Self-pay | Admitting: Family Medicine

## 2017-06-20 VITALS — BP 132/80 | Ht 65.0 in | Wt 198.0 lb

## 2017-06-20 DIAGNOSIS — M542 Cervicalgia: Secondary | ICD-10-CM

## 2017-06-20 DIAGNOSIS — S199XXD Unspecified injury of neck, subsequent encounter: Secondary | ICD-10-CM | POA: Diagnosis not present
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
## 2017-07-04 ENCOUNTER — Telehealth: Payer: Self-pay

## 2017-07-04 NOTE — Telephone Encounter (Signed)
Received fax from [facility] requesting medical records from 06/17/12 to present.  Records for this office only (1 office visit and 1 EMG report) faxed.

## 2017-08-02 ENCOUNTER — Encounter: Payer: Self-pay | Admitting: Obstetrics & Gynecology

## 2017-08-02 ENCOUNTER — Ambulatory Visit: Payer: BLUE CROSS/BLUE SHIELD | Admitting: Obstetrics & Gynecology

## 2017-08-02 VITALS — BP 130/74 | HR 80 | Ht 65.0 in | Wt 203.0 lb

## 2017-08-02 DIAGNOSIS — M940 Chondrocostal junction syndrome [Tietze]: Secondary | ICD-10-CM

## 2017-08-02 DIAGNOSIS — N762 Acute vulvitis: Secondary | ICD-10-CM | POA: Diagnosis not present

## 2017-08-02 MED ORDER — NYSTATIN-TRIAMCINOLONE 100000-0.1 UNIT/GM-% EX OINT: 1.0000 "application " | TOPICAL_OINTMENT | Freq: Two times a day (BID) | CUTANEOUS | 11 refills | Status: DC

## 2017-08-02 MED ORDER — METAXALONE 800 MG PO TABS: 800.0000 mg | ORAL_TABLET | Freq: Three times a day (TID) | ORAL | 0 refills | Status: DC

## 2017-08-02 MED ORDER — PIROXICAM 20 MG PO CAPS: 20.0000 mg | ORAL_CAPSULE | Freq: Every day | ORAL | 1 refills | Status: DC
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
## 2017-08-16 ENCOUNTER — Ambulatory Visit: Payer: BLUE CROSS/BLUE SHIELD | Admitting: Family Medicine

## 2017-08-16 VITALS — BP 122/74 | Ht 65.0 in | Wt 205.0 lb

## 2017-08-16 DIAGNOSIS — S199XXD Unspecified injury of neck, subsequent encounter: Secondary | ICD-10-CM

## 2017-08-16 DIAGNOSIS — G8921 Chronic pain due to trauma: Secondary | ICD-10-CM

## 2017-08-16 MED ORDER — IBUPROFEN 800 MG PO TABS: 800.0000 mg | ORAL_TABLET | Freq: Three times a day (TID) | ORAL | 2 refills | Status: DC
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
## 2017-08-31 DIAGNOSIS — E049 Nontoxic goiter, unspecified: Secondary | ICD-10-CM | POA: Diagnosis not present

## 2017-08-31 DIAGNOSIS — E213 Hyperparathyroidism, unspecified: Secondary | ICD-10-CM | POA: Diagnosis not present

## 2017-08-31 DIAGNOSIS — Z9089 Acquired absence of other organs: Secondary | ICD-10-CM | POA: Diagnosis not present

## 2017-09-05 ENCOUNTER — Encounter: Payer: Self-pay | Admitting: Family Medicine

## 2017-09-06 ENCOUNTER — Ambulatory Visit: Payer: BLUE CROSS/BLUE SHIELD | Admitting: Obstetrics & Gynecology

## 2017-09-13 ENCOUNTER — Encounter: Payer: Self-pay | Admitting: Family Medicine

## 2017-09-16 ENCOUNTER — Encounter: Payer: Self-pay | Admitting: Adult Health

## 2017-09-16 ENCOUNTER — Encounter (INDEPENDENT_AMBULATORY_CARE_PROVIDER_SITE_OTHER): Payer: Self-pay

## 2017-09-16 ENCOUNTER — Ambulatory Visit: Payer: BLUE CROSS/BLUE SHIELD | Admitting: Adult Health

## 2017-09-16 VITALS — BP 124/88 | HR 77 | Wt 205.0 lb

## 2017-09-16 DIAGNOSIS — M940 Chondrocostal junction syndrome [Tietze]: Secondary | ICD-10-CM | POA: Diagnosis not present

## 2017-09-16 MED ORDER — TIZANIDINE HCL 4 MG PO CAPS: ORAL_CAPSULE | ORAL | 1 refills | Status: DC
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
## 2017-09-16 NOTE — Patient Instructions (Signed)
Costochondritis Costochondritis is swelling and irritation (inflammation) of the tissue (cartilage) that connects your ribs to your breastbone (sternum). This causes pain in the front of your chest. The pain usually starts gradually and involves more than one rib. What are the causes? The exact cause of this condition is not always known. It results from stress on the cartilage where your ribs attach to your sternum. The cause of this stress could be:  Chest injury (trauma).  Exercise or activity, such as lifting.  Severe coughing.  What increases the risk? You may be at higher risk for this condition if you:  Are female.  Are 30?48 years old.  Recently started a new exercise or work activity.  Have low levels of vitamin D.  Have a condition that makes you cough frequently.  What are the signs or symptoms? The main symptom of this condition is chest pain. The pain:  Usually starts gradually and can be sharp or dull.  Gets worse with deep breathing, coughing, or exercise.  Gets better with rest.  May be worse when you press on the sternum-rib connection (tenderness).  How is this diagnosed? This condition is diagnosed based on your symptoms, medical history, and a physical exam. Your health care provider will check for tenderness when pressing on your sternum. This is the most important finding. You may also have tests to rule out other causes of chest pain. These may include:  A chest X-ray to check for lung problems.  An electrocardiogram (ECG) to see if you have a heart problem that could be causing the pain.  An imaging scan to rule out a chest or rib fracture.  How is this treated? This condition usually goes away on its own over time. Your health care provider may prescribe an NSAID to reduce pain and inflammation. Your health care provider may also suggest that you:  Rest and avoid activities that make pain worse.  Apply heat or cold to the area to reduce pain  and inflammation.  Do exercises to stretch your chest muscles.  If these treatments do not help, your health care provider may inject a numbing medicine at the sternum-rib connection to help relieve the pain. Follow these instructions at home:  Avoid activities that make pain worse. This includes any activities that use chest, abdominal, and side muscles.  If directed, put ice on the painful area: ? Put ice in a plastic bag. ? Place a towel between your skin and the bag. ? Leave the ice on for 20 minutes, 2-3 times a day.  If directed, apply heat to the affected area as often as told by your health care provider. Use the heat source that your health care provider recommends, such as a moist heat pack or a heating pad. ? Place a towel between your skin and the heat source. ? Leave the heat on for 20-30 minutes. ? Remove the heat if your skin turns bright red. This is especially important if you are unable to feel pain, heat, or cold. You may have a greater risk of getting burned.  Take over-the-counter and prescription medicines only as told by your health care provider.  Return to your normal activities as told by your health care provider. Ask your health care provider what activities are safe for you.  Keep all follow-up visits as told by your health care provider. This is important. Contact a health care provider if:  You have chills or a fever.  Your pain does not go   away or it gets worse.  You have a cough that does not go away (is persistent). Get help right away if:  You have shortness of breath. This information is not intended to replace advice given to you by your health care provider. Make sure you discuss any questions you have with your health care provider. Document Released: 05/26/2005 Document Revised: 03/05/2016 Document Reviewed: 12/10/2015 Elsevier Interactive Patient Education  2018 Elsevier Inc.   

## 2017-09-30 ENCOUNTER — Encounter: Payer: Self-pay | Admitting: Adult Health

## 2017-09-30 ENCOUNTER — Ambulatory Visit: Payer: BLUE CROSS/BLUE SHIELD | Admitting: Adult Health

## 2017-09-30 VITALS — BP 150/80 | HR 72 | Ht 65.0 in | Wt 203.5 lb

## 2017-09-30 DIAGNOSIS — M940 Chondrocostal junction syndrome [Tietze]: Secondary | ICD-10-CM

## 2017-09-30 MED ORDER — PIROXICAM 20 MG PO CAPS: 20.0000 mg | ORAL_CAPSULE | Freq: Every day | ORAL | 1 refills | Status: DC

## 2017-09-30 NOTE — Progress Notes (Signed)
Subjective:     Patient ID: Kelsey Cooke, female   DOB: 1970-12-07, 47 y.o.   MRN: [id number]  HPI Kelsey Cooke is a 47 year old white female back in follow up of taking zanaflex for costochondritis and is better.  Review of Systems Breasts not tight, but still tender at ribs, sternum Reviewed past medical,surgical, social and family history. Reviewed medications and allergies.     Objective:   Physical Exam BP (!) 150/80 (BP Location: Left Arm, Patient Position: Sitting, Cuff Size: Large)   Pulse 72   Ht 5\' 5"  (1.651 m)   Wt 203 lb 8 oz (92.3 kg)   LMP 09/13/2017   BMI 33.86 kg/m Talk only, feels better, breast not tight but still tender at ribs, sternum.   Will add Feldene to see if helps.  Assessment:     1. Costochondritis       Plan:     Continue Zanaflex prn Use ice and heat prn Meds ordered this encounter  Medications  . piroxicam (FELDENE) 20 MG capsule    Sig: Take 1 capsule (20 mg total) by mouth daily.    Dispense:  30 capsule    Refill:  1    Order Specific Question:   Supervising Provider    Answer:   Tania Ade H [[id number]]  F/U prn Call with me with how you feeling in 2-3 weeks

## 2017-10-11 ENCOUNTER — Other Ambulatory Visit (HOSPITAL_COMMUNITY): Payer: Self-pay | Admitting: Orthopedic Surgery

## 2017-10-11 DIAGNOSIS — M25511 Pain in right shoulder: Secondary | ICD-10-CM | POA: Diagnosis not present

## 2017-10-17 ENCOUNTER — Ambulatory Visit (HOSPITAL_COMMUNITY)
Admission: RE | Admit: 2017-10-17 | Discharge: 2017-10-17 | Disposition: A | Discharge status: Home / Self Care | Payer: BLUE CROSS/BLUE SHIELD | Source: Ambulatory Visit | Attending: Orthopedic Surgery | Admitting: Orthopedic Surgery

## 2017-10-17 DIAGNOSIS — R609 Edema, unspecified: Secondary | ICD-10-CM | POA: Diagnosis not present

## 2017-10-17 DIAGNOSIS — M25511 Pain in right shoulder: Secondary | ICD-10-CM

## 2017-10-17 DIAGNOSIS — M12811 Other specific arthropathies, not elsewhere classified, right shoulder: Secondary | ICD-10-CM | POA: Diagnosis not present

## 2017-10-20 DIAGNOSIS — M542 Cervicalgia: Secondary | ICD-10-CM | POA: Diagnosis not present

## 2017-11-15 DIAGNOSIS — M542 Cervicalgia: Secondary | ICD-10-CM | POA: Diagnosis not present

## 2017-11-29 ENCOUNTER — Other Ambulatory Visit: Payer: Self-pay | Admitting: Adult Health

## 2017-12-08 ENCOUNTER — Other Ambulatory Visit: Payer: Self-pay | Admitting: Adult Health

## 2017-12-08 DIAGNOSIS — Z1231 Encounter for screening mammogram for malignant neoplasm of breast: Secondary | ICD-10-CM

## 2017-12-13 DIAGNOSIS — E213 Hyperparathyroidism, unspecified: Secondary | ICD-10-CM | POA: Diagnosis not present

## 2017-12-28 ENCOUNTER — Encounter (HOSPITAL_COMMUNITY): Payer: Self-pay

## 2017-12-28 ENCOUNTER — Ambulatory Visit (HOSPITAL_COMMUNITY)
Admission: RE | Admit: 2017-12-28 | Discharge: 2017-12-28 | Disposition: A | Discharge status: Home / Self Care | Payer: BLUE CROSS/BLUE SHIELD | Source: Ambulatory Visit | Attending: Adult Health | Admitting: Adult Health

## 2017-12-28 DIAGNOSIS — Z1231 Encounter for screening mammogram for malignant neoplasm of breast: Secondary | ICD-10-CM | POA: Insufficient documentation

## 2018-01-17 DIAGNOSIS — E038 Other specified hypothyroidism: Secondary | ICD-10-CM | POA: Diagnosis not present

## 2018-02-08 ENCOUNTER — Telehealth: Payer: Self-pay | Admitting: Adult Health

## 2018-02-08 NOTE — Telephone Encounter (Signed)
LMOVM

## 2018-02-08 NOTE — Telephone Encounter (Signed)
Patient called stating that she would like a call back from Jashawna's Nurse, Patient did not state the reason why. Please contact pt

## 2018-02-10 MED ORDER — PHENAZOPYRIDINE HCL 200 MG PO TABS: 200.0000 mg | ORAL_TABLET | Freq: Three times a day (TID) | ORAL | 0 refills | Status: DC | PRN

## 2018-02-10 MED ORDER — SULFAMETHOXAZOLE-TRIMETHOPRIM 800-160 MG PO TABS: 1.0000 | ORAL_TABLET | Freq: Two times a day (BID) | ORAL | 0 refills | Status: DC

## 2018-02-10 NOTE — Telephone Encounter (Signed)
Pt complaining of UTi symptoms, urgency and then some spasms.Will rx septra ds and pyridium,and push fluids, if not better call first of next week

## 2018-02-20 ENCOUNTER — Telehealth: Payer: Self-pay | Admitting: *Deleted

## 2018-02-20 NOTE — Telephone Encounter (Signed)
Left message that I returned her call  

## 2018-02-20 NOTE — Telephone Encounter (Signed)
Pt says she was good til finished meds, burning now, will get UA C&S 6/27 but could be IC

## 2018-02-23 ENCOUNTER — Other Ambulatory Visit: Payer: BLUE CROSS/BLUE SHIELD

## 2018-02-23 DIAGNOSIS — R300 Dysuria: Secondary | ICD-10-CM | POA: Diagnosis not present

## 2018-02-24 LAB — MICROSCOPIC EXAMINATION: Casts: NONE SEEN /lpf

## 2018-02-24 LAB — URINALYSIS, ROUTINE W REFLEX MICROSCOPIC
Bilirubin, UA: NEGATIVE
Glucose, UA: NEGATIVE
Ketones, UA: NEGATIVE
Nitrite, UA: NEGATIVE
Protein, UA: NEGATIVE
RBC, UA: NEGATIVE
Specific Gravity, UA: 1.013 (ref 1.005–1.030)
Urobilinogen, Ur: 0.2 mg/dL (ref 0.2–1.0)
pH, UA: 7.5 (ref 5.0–7.5)

## 2018-02-25 DIAGNOSIS — M542 Cervicalgia: Secondary | ICD-10-CM | POA: Diagnosis not present

## 2018-02-25 DIAGNOSIS — M25511 Pain in right shoulder: Secondary | ICD-10-CM | POA: Diagnosis not present

## 2018-02-25 LAB — URINE CULTURE: Organism ID, Bacteria: NO GROWTH

## 2018-02-27 ENCOUNTER — Telehealth: Payer: Self-pay | Admitting: Adult Health

## 2018-02-27 NOTE — Telephone Encounter (Signed)
Left Message that urine had not growth

## 2018-03-07 DIAGNOSIS — M542 Cervicalgia: Secondary | ICD-10-CM | POA: Diagnosis not present

## 2018-03-08 ENCOUNTER — Ambulatory Visit (INDEPENDENT_AMBULATORY_CARE_PROVIDER_SITE_OTHER): Payer: BLUE CROSS/BLUE SHIELD | Admitting: Adult Health

## 2018-03-08 ENCOUNTER — Encounter: Payer: Self-pay | Admitting: Adult Health

## 2018-03-08 ENCOUNTER — Other Ambulatory Visit: Payer: Self-pay

## 2018-03-08 VITALS — BP 153/92 | HR 72 | Ht 65.5 in | Wt 205.0 lb

## 2018-03-08 DIAGNOSIS — Z975 Presence of (intrauterine) contraceptive device: Secondary | ICD-10-CM | POA: Diagnosis not present

## 2018-03-08 DIAGNOSIS — Z01419 Encounter for gynecological examination (general) (routine) without abnormal findings: Secondary | ICD-10-CM | POA: Diagnosis not present

## 2018-03-08 DIAGNOSIS — Z1212 Encounter for screening for malignant neoplasm of rectum: Secondary | ICD-10-CM | POA: Diagnosis not present

## 2018-03-08 DIAGNOSIS — Z01411 Encounter for gynecological examination (general) (routine) with abnormal findings: Secondary | ICD-10-CM

## 2018-03-08 DIAGNOSIS — R10814 Left lower quadrant abdominal tenderness: Secondary | ICD-10-CM | POA: Diagnosis not present

## 2018-03-08 DIAGNOSIS — Z8639 Personal history of other endocrine, nutritional and metabolic disease: Secondary | ICD-10-CM | POA: Diagnosis not present

## 2018-03-08 DIAGNOSIS — I10 Essential (primary) hypertension: Secondary | ICD-10-CM | POA: Diagnosis not present

## 2018-03-08 DIAGNOSIS — Z1211 Encounter for screening for malignant neoplasm of colon: Secondary | ICD-10-CM | POA: Insufficient documentation

## 2018-03-08 LAB — HEMOCCULT GUIAC POC 1CARD (OFFICE): Fecal Occult Blood, POC: NEGATIVE

## 2018-03-08 MED ORDER — HYDROCHLOROTHIAZIDE 12.5 MG PO CAPS: 12.5000 mg | ORAL_CAPSULE | Freq: Every day | ORAL | 4 refills | Status: DC
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
## 2018-03-09 ENCOUNTER — Telehealth: Payer: Self-pay | Admitting: Adult Health

## 2018-03-09 LAB — LIPID PANEL
Chol/HDL Ratio: 3.8 ratio (ref 0.0–4.4)
Cholesterol, Total: 165 mg/dL (ref 100–199)
HDL: 43 mg/dL (ref >39)
LDL Calculated: 104 mg/dL — ABNORMAL HIGH (ref 0–99)
Triglycerides: 90 mg/dL (ref 0–149)
VLDL Cholesterol Cal: 18 mg/dL (ref 5–40)

## 2018-03-09 LAB — COMPREHENSIVE METABOLIC PANEL
ALT: 18 IU/L (ref 0–32)
AST: 18 IU/L (ref 0–40)
Albumin/Globulin Ratio: 1.5 (ref 1.2–2.2)
Albumin: 4.4 g/dL (ref 3.5–5.5)
Alkaline Phosphatase: 103 IU/L (ref 39–117)
BUN/Creatinine Ratio: 8 — ABNORMAL LOW (ref 9–23)
BUN: 6 mg/dL (ref 6–24)
Bilirubin Total: 0.5 mg/dL (ref 0.0–1.2)
CO2: 25 mmol/L (ref 20–29)
Calcium: 9.5 mg/dL (ref 8.7–10.2)
Chloride: 102 mmol/L (ref 96–106)
Creatinine, Ser: 0.75 mg/dL (ref 0.57–1.00)
GFR calc Af Amer: 109 mL/min/{1.73_m2} (ref >59)
GFR calc non Af Amer: 95 mL/min/{1.73_m2} (ref >59)
Globulin, Total: 2.9 g/dL (ref 1.5–4.5)
Glucose: 99 mg/dL (ref 65–99)
Potassium: 4.5 mmol/L (ref 3.5–5.2)
Sodium: 144 mmol/L (ref 134–144)
Total Protein: 7.3 g/dL (ref 6.0–8.5)

## 2018-03-09 LAB — CBC
Hematocrit: 40.2 % (ref 34.0–46.6)
Hemoglobin: 12.9 g/dL (ref 11.1–15.9)
MCH: 25.1 pg — ABNORMAL LOW (ref 26.6–33.0)
MCHC: 32.1 g/dL (ref 31.5–35.7)
MCV: 78 fL — ABNORMAL LOW (ref 79–97)
Platelets: 293 10*3/uL (ref 150–450)
RBC: 5.13 x10E6/uL (ref 3.77–5.28)
RDW: 15 % (ref 12.3–15.4)
WBC: 7.6 10*3/uL (ref 3.4–10.8)

## 2018-03-09 LAB — TSH: TSH: 2.3 u[IU]/mL (ref 0.450–4.500)

## 2018-03-09 LAB — T4, FREE: Free T4: 1.14 ng/dL (ref 0.82–1.77)

## 2018-03-09 NOTE — Telephone Encounter (Signed)
Pt aware of labs  

## 2018-03-14 DIAGNOSIS — M542 Cervicalgia: Secondary | ICD-10-CM | POA: Diagnosis not present

## 2018-03-22 ENCOUNTER — Ambulatory Visit (INDEPENDENT_AMBULATORY_CARE_PROVIDER_SITE_OTHER): Payer: BLUE CROSS/BLUE SHIELD

## 2018-03-22 DIAGNOSIS — R10814 Left lower quadrant abdominal tenderness: Secondary | ICD-10-CM

## 2018-03-22 DIAGNOSIS — Z975 Presence of (intrauterine) contraceptive device: Secondary | ICD-10-CM

## 2018-03-22 DIAGNOSIS — D252 Subserosal leiomyoma of uterus: Secondary | ICD-10-CM

## 2018-03-22 NOTE — Progress Notes (Signed)
PELVIC US TA/TV: enlarged anteverted uterus w/a 6.9 x 6 x 6.4 fundal subserosal fibroid (increased in size),EEC 7.6 mm,IUD is centrally located w/in the endometrium,normal ovaries bilat,ovaries appear mobile,no free fluid,no pain during ultrasound

## 2018-03-23 ENCOUNTER — Telehealth: Payer: Self-pay | Admitting: Adult Health

## 2018-03-23 NOTE — Telephone Encounter (Signed)
Left message message that that US showed fundal fibroid, IUD in place and ovaries normal

## 2018-03-27 ENCOUNTER — Telehealth: Payer: Self-pay | Admitting: Adult Health

## 2018-03-27 NOTE — Telephone Encounter (Signed)
Left message I returned call

## 2018-03-27 NOTE — Telephone Encounter (Signed)
Patient called stating that she would like a call back from [place], [place] states that [place] told her to call back this morning. Please contact pt

## 2018-04-17 DIAGNOSIS — M542 Cervicalgia: Secondary | ICD-10-CM | POA: Diagnosis not present

## 2018-04-19 DIAGNOSIS — Z029 Encounter for administrative examinations, unspecified: Secondary | ICD-10-CM

## 2018-04-20 ENCOUNTER — Ambulatory Visit: Payer: BLUE CROSS/BLUE SHIELD | Admitting: Family Medicine

## 2018-04-20 ENCOUNTER — Encounter: Payer: Self-pay | Admitting: Family Medicine

## 2018-04-20 VITALS — BP 124/82 | Temp 98.9°F | Ht 65.5 in | Wt 208.0 lb

## 2018-04-20 DIAGNOSIS — M6740 Ganglion, unspecified site: Secondary | ICD-10-CM

## 2018-04-20 NOTE — Patient Instructions (Signed)
Ganglion Cyst A ganglion cyst is a noncancerous, fluid-filled lump that occurs near joints or tendons. The ganglion cyst grows out of a joint or the lining of a tendon. It most often develops in the hand or wrist, but it can also develop in the shoulder, elbow, hip, knee, ankle, or foot. The round or oval ganglion cyst can be the size of a pea or larger than a grape. Increased activity may enlarge the size of the cyst because more fluid starts to build up. What are the causes? It is not known what causes a ganglion cyst to grow. However, it may be related to:  Inflammation or irritation around the joint.  An injury.  Repetitive movements or overuse.  Arthritis.  What increases the risk? Risk factors include:  Being a woman.  Being age 20-50.  What are the signs or symptoms? Symptoms may include:  A lump. This most often appears on the hand or wrist, but it can occur in other areas of the body.  Tingling.  Pain.  Numbness.  Muscle weakness.  Weak grip.  Less movement in a joint.  How is this diagnosed? Ganglion cysts are most often diagnosed based on a physical exam. Your health care provider will feel the lump and may shine a light alongside it. If it is a ganglion cyst, a light often shines through it. Your health care provider may order an X-ray, ultrasound, or MRI to rule out other conditions. How is this treated? Ganglion cysts usually go away on their own without treatment. If pain or other symptoms are involved, treatment may be needed. Treatment is also needed if the ganglion cyst limits your movement or if it gets infected. Treatment may include:  Wearing a brace or splint on your wrist or finger.  Taking anti-inflammatory medicine.  Draining fluid from the lump with a needle (aspiration).  Injecting a steroid into the joint.  Surgery to remove the ganglion cyst.  Follow these instructions at home:  Do not press on the ganglion cyst, poke it with a  needle, or hit it.  Take medicines only as directed by your health care provider.  Wear your brace or splint as directed by your health care provider.  Watch your ganglion cyst for any changes.  Keep all follow-up visits as directed by your health care provider. This is important. Contact a health care provider if:  Your ganglion cyst becomes larger or more painful.  You have increased redness, red streaks, or swelling.  You have pus coming from the lump.  You have weakness or numbness in the affected area.  You have a fever or chills. This information is not intended to replace advice given to you by your health care provider. Make sure you discuss any questions you have with your health care provider. Document Released: 08/13/2000 Document Revised: 01/22/2016 Document Reviewed: 01/29/2014 Elsevier Interactive Patient Education  2018 Elsevier Inc.  

## 2018-04-20 NOTE — Progress Notes (Signed)
   Subjective:    Patient ID: Kelsey Cooke, female    DOB: 09-20-1970, 47 y.o.   MRN: [id number]  HPIleft ankle pain. Knot on left ankle. Pain for about 2 weeks.    Walked on sand a lot, had trouble with pain     And burnng and painful  Pos hx of sprain in the past     Using   meds reg  Motrin 800 mg   Tend to sprain thru the yrs      Review of Systems No headache, no major weight loss or weight gain, no chest pain no back pain abdominal pain no change in bowel habits complete ROS otherwise negative     Objective:   Physical Exam  Alert vitals stable, NAD. Blood pressure good on repeat. HEENT normal. Lungs clear. Heart regular rate and rhythm. Left lateral foot tender along the peroneal longus tendon.  Palpable nodule consistent with ganglion cyst impression ganglion cyst.  No x-rays rationale discussed use anti-inflammatory medicine.  Local measures discussed including massage expect slow resolution or potentially long-term presents      Assessment & Plan:

## 2018-04-25 DIAGNOSIS — M542 Cervicalgia: Secondary | ICD-10-CM | POA: Diagnosis not present

## 2018-05-08 DIAGNOSIS — E213 Hyperparathyroidism, unspecified: Secondary | ICD-10-CM | POA: Diagnosis not present

## 2018-05-11 ENCOUNTER — Other Ambulatory Visit: Payer: Self-pay

## 2018-05-11 DIAGNOSIS — G540 Brachial plexus disorders: Secondary | ICD-10-CM

## 2018-06-27 ENCOUNTER — Other Ambulatory Visit (HOSPITAL_COMMUNITY): Payer: BLUE CROSS/BLUE SHIELD

## 2018-06-27 ENCOUNTER — Encounter: Payer: BLUE CROSS/BLUE SHIELD | Admitting: Vascular Surgery

## 2018-09-04 ENCOUNTER — Telehealth (HOSPITAL_COMMUNITY): Payer: Self-pay | Admitting: Surgery

## 2018-09-04 NOTE — Telephone Encounter (Signed)
Attempted to contact patient to confirm appointments for 09/05/2018. ACB

## 2018-09-05 ENCOUNTER — Ambulatory Visit (HOSPITAL_COMMUNITY)
Admission: RE | Admit: 2018-09-05 | Discharge: 2018-09-05 | Disposition: A | Discharge status: Home / Self Care | Payer: BLUE CROSS/BLUE SHIELD | Source: Ambulatory Visit | Attending: Vascular Surgery | Admitting: Vascular Surgery

## 2018-09-05 ENCOUNTER — Encounter: Payer: Self-pay | Admitting: Vascular Surgery

## 2018-09-05 ENCOUNTER — Ambulatory Visit: Payer: BLUE CROSS/BLUE SHIELD | Admitting: Vascular Surgery

## 2018-09-05 ENCOUNTER — Other Ambulatory Visit: Payer: Self-pay

## 2018-09-05 ENCOUNTER — Encounter

## 2018-09-05 VITALS — BP 140/83 | HR 75 | Temp 97.3°F | Resp 18 | Ht 65.0 in | Wt 208.0 lb

## 2018-09-05 DIAGNOSIS — M25511 Pain in right shoulder: Secondary | ICD-10-CM | POA: Diagnosis not present

## 2018-09-05 DIAGNOSIS — G540 Brachial plexus disorders: Secondary | ICD-10-CM | POA: Insufficient documentation
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
## 2018-09-06 ENCOUNTER — Encounter: Payer: Self-pay | Admitting: Family Medicine

## 2018-09-06 ENCOUNTER — Ambulatory Visit: Payer: BLUE CROSS/BLUE SHIELD | Admitting: Family Medicine

## 2018-09-06 VITALS — BP 122/74 | Temp 98.4°F | Ht 65.0 in | Wt 211.0 lb

## 2018-09-06 DIAGNOSIS — J019 Acute sinusitis, unspecified: Secondary | ICD-10-CM | POA: Diagnosis not present

## 2018-09-06 DIAGNOSIS — K219 Gastro-esophageal reflux disease without esophagitis: Secondary | ICD-10-CM | POA: Diagnosis not present

## 2018-09-06 MED ORDER — DOXYCYCLINE HYCLATE 100 MG PO TABS: 100.0000 mg | ORAL_TABLET | Freq: Two times a day (BID) | ORAL | 0 refills | Status: DC

## 2018-09-06 MED ORDER — PANTOPRAZOLE SODIUM 40 MG PO TBEC: 40.0000 mg | DELAYED_RELEASE_TABLET | Freq: Every day | ORAL | 1 refills | Status: DC

## 2018-09-06 NOTE — Patient Instructions (Signed)
Sinusitis, Adult  Sinusitis is inflammation of your sinuses. Sinuses are hollow spaces in the bones around your face. Your sinuses are located:   Around your eyes.   In the middle of your forehead.   Behind your nose.   In your cheekbones.  Mucus normally drains out of your sinuses. When your nasal tissues become inflamed or swollen, mucus can become trapped or blocked. This allows bacteria, viruses, and fungi to grow, which leads to infection. Most infections of the sinuses are caused by a virus.  Sinusitis can develop quickly. It can last for up to 4 weeks (acute) or for more than 12 weeks (chronic). Sinusitis often develops after a cold.  What are the causes?  This condition is caused by anything that creates swelling in the sinuses or stops mucus from draining. This includes:   Allergies.   Asthma.   Infection from bacteria or viruses.   Deformities or blockages in your nose or sinuses.   Abnormal growths in the nose (nasal polyps).   Pollutants, such as chemicals or irritants in the air.   Infection from fungi (rare).  What increases the risk?  You are more likely to develop this condition if you:   Have a weak body defense system (immune system).   Do a lot of swimming or diving.   Overuse nasal sprays.   Smoke.  What are the signs or symptoms?  The main symptoms of this condition are pain and a feeling of pressure around the affected sinuses. Other symptoms include:   Stuffy nose or congestion.   Thick drainage from your nose.   Swelling and warmth over the affected sinuses.   Headache.   Upper toothache.   A cough that may get worse at night.   Extra mucus that collects in the throat or the back of the nose (postnasal drip).   Decreased sense of smell and taste.   Fatigue.   A fever.   Sore throat.   Bad breath.  How is this diagnosed?  This condition is diagnosed based on:   Your symptoms.   Your medical history.   A physical exam.   Tests to find out if your condition is  acute or chronic. This may include:  ? Checking your nose for nasal polyps.  ? Viewing your sinuses using a device that has a light (endoscope).  ? Testing for allergies or bacteria.  ? Imaging tests, such as an MRI or CT scan.  In rare cases, a bone biopsy may be done to rule out more serious types of fungal sinus disease.  How is this treated?  Treatment for sinusitis depends on the cause and whether your condition is chronic or acute.   If caused by a virus, your symptoms should go away on their own within 10 days. You may be given medicines to relieve symptoms. They include:  ? Medicines that shrink swollen nasal passages (topical intranasal decongestants).  ? Medicines that treat allergies (antihistamines).  ? A spray that eases inflammation of the nostrils (topical intranasal corticosteroids).  ? Rinses that help get rid of thick mucus in your nose (nasal saline washes).   If caused by bacteria, your health care provider may recommend waiting to see if your symptoms improve. Most bacterial infections will get better without antibiotic medicine. You may be given antibiotics if you have:  ? A severe infection.  ? A weak immune system.   If caused by narrow nasal passages or nasal polyps, you may need   to have surgery.  Follow these instructions at home:  Medicines   Take, use, or apply over-the-counter and prescription medicines only as told by your health care provider. These may include nasal sprays.   If you were prescribed an antibiotic medicine, take it as told by your health care provider. Do not stop taking the antibiotic even if you start to feel better.  Hydrate and humidify     Drink enough fluid to keep your urine pale yellow. Staying hydrated will help to thin your mucus.   Use a cool mist humidifier to keep the humidity level in your home above 50%.   Inhale steam for 10-15 minutes, 3-4 times a day, or as told by your health care provider. You can do this in the bathroom while a hot shower is  running.   Limit your exposure to cool or dry air.  Rest   Rest as much as possible.   Sleep with your head raised (elevated).   Make sure you get enough sleep each night.  General instructions     Apply a warm, moist washcloth to your face 3-4 times a day or as told by your health care provider. This will help with discomfort.   Wash your hands often with soap and water to reduce your exposure to germs. If soap and water are not available, use hand sanitizer.   Do not smoke. Avoid being around people who are smoking (secondhand smoke).   Keep all follow-up visits as told by your health care provider. This is important.  Contact a health care provider if:   You have a fever.   Your symptoms get worse.   Your symptoms do not improve within 10 days.  Get help right away if:   You have a severe headache.   You have persistent vomiting.   You have severe pain or swelling around your face or eyes.   You have vision problems.   You develop confusion.   Your neck is stiff.   You have trouble breathing.  Summary   Sinusitis is soreness and inflammation of your sinuses. Sinuses are hollow spaces in the bones around your face.   This condition is caused by nasal tissues that become inflamed or swollen. The swelling traps or blocks the flow of mucus. This allows bacteria, viruses, and fungi to grow, which leads to infection.   If you were prescribed an antibiotic medicine, take it as told by your health care provider. Do not stop taking the antibiotic even if you start to feel better.   Keep all follow-up visits as told by your health care provider. This is important.  This information is not intended to replace advice given to you by your health care provider. Make sure you discuss any questions you have with your health care provider.  Document Released: 08/16/2005 Document Revised: 01/16/2018 Document Reviewed: 01/16/2018  Elsevier Interactive Patient Education  2019 [employer]

## 2018-09-06 NOTE — Progress Notes (Signed)
   Subjective:    Patient ID: Kelsey Cooke, female    DOB: 06-Dec-1970, 48 y.o.   MRN: [id number]  Sinusitis  This is a new problem. Episode onset: 3 days. Associated symptoms include congestion, coughing, ear pain, headaches and a sore throat. (Dizziness, eye drainage) Past treatments include nothing.   Reports this morning had some pain underneath both breasts lasted about 2 minutes and went away, reports described as uncomfortable. Reports hx of reflux and having bad indigestion. No N/V/D.   Reports 2 days ago started with rhinorrhea and headache. Now having congestion, pain to gums/cheek bones. Ears are hurting and popping. Reports slight cough - dry tickle. No bodyaches or fever.    Review of Systems  HENT: Positive for congestion, ear pain and sore throat.   Respiratory: Positive for cough.   Neurological: Positive for headaches.       Objective:   Physical Exam Vitals signs and nursing note reviewed.  Constitutional:      General: She is not in acute distress.    Appearance: Normal appearance. She is not toxic-appearing.  HENT:     Head: Normocephalic and atraumatic.     Right Ear: Tympanic membrane normal.     Left Ear: Tympanic membrane normal.     Nose: Nasal tenderness and congestion present.     Mouth/Throat:     Mouth: Mucous membranes are moist.     Pharynx: Oropharynx is clear. Uvula midline.  Eyes:     General:        Right eye: No discharge.        Left eye: No discharge.  Neck:     Musculoskeletal: Neck supple. No neck rigidity.  Cardiovascular:     Rate and Rhythm: Normal rate and regular rhythm.     Heart sounds: Normal heart sounds.  Pulmonary:     Effort: Pulmonary effort is normal. No respiratory distress.     Breath sounds: Normal breath sounds.  Lymphadenopathy:     Cervical: No cervical adenopathy.  Skin:    General: Skin is warm and dry.  Neurological:     Mental Status: She is alert and oriented to person, place, and time.          Assessment & Plan:  1. Acute rhinosinusitis Discussed likely viral etiology at this time. Recommend holding off on antibiotic, written prescription given to patient with instructions to fill if not improved or worsening over the next several days. Symptomatic care discussed, warning signs discussed. F/u if symptoms worsen or fail to improve.   2. Gastroesophageal reflux disease, esophagitis presence not specified Reports reflux under good control when on PPI previously, states used prn. Will refill for trial of protonix, f/u if symptoms worsen or fail to improve. Do not feel pain below both breasts is cardiac in nature, likely r/t reflux symptoms or coughing. Warning signs discussed. F/u prn.

## 2018-09-11 DIAGNOSIS — M7501 Adhesive capsulitis of right shoulder: Secondary | ICD-10-CM | POA: Insufficient documentation

## 2018-09-11 DIAGNOSIS — M542 Cervicalgia: Secondary | ICD-10-CM | POA: Diagnosis not present

## 2018-09-11 DIAGNOSIS — M71372 Other bursal cyst, left ankle and foot: Secondary | ICD-10-CM | POA: Diagnosis not present

## 2018-09-19 DIAGNOSIS — M25511 Pain in right shoulder: Secondary | ICD-10-CM | POA: Diagnosis not present

## 2018-09-26 DIAGNOSIS — M25511 Pain in right shoulder: Secondary | ICD-10-CM | POA: Diagnosis not present

## 2018-10-03 DIAGNOSIS — M25511 Pain in right shoulder: Secondary | ICD-10-CM | POA: Diagnosis not present

## 2018-10-18 DIAGNOSIS — R2242 Localized swelling, mass and lump, left lower limb: Secondary | ICD-10-CM | POA: Diagnosis not present

## 2018-10-18 DIAGNOSIS — M25572 Pain in left ankle and joints of left foot: Secondary | ICD-10-CM | POA: Diagnosis not present

## 2018-10-25 DIAGNOSIS — M25572 Pain in left ankle and joints of left foot: Secondary | ICD-10-CM | POA: Diagnosis not present

## 2018-10-26 DIAGNOSIS — M25511 Pain in right shoulder: Secondary | ICD-10-CM | POA: Diagnosis not present

## 2018-10-27 ENCOUNTER — Telehealth: Payer: Self-pay | Admitting: Family Medicine

## 2018-10-27 NOTE — Telephone Encounter (Signed)
1.  Low-salt diet 2.  Increase HCTZ 25 mg daily would be wise Office visit early next week-it is necessary for [place] to do a follow-up May take 2 of her 12.5 mg tablets to equal 25 mg  Certainly if any worrisome symptoms such as chest pain shortness of breath or other worrisome symptoms ER otherwise follow-up early next week

## 2018-10-27 NOTE — Telephone Encounter (Signed)
Patient was having blood pressure issues today took pressure at 2:15 at [facility] and it read 166/102 she is on hydrochlorothiazide 12.5 mg. Please advise

## 2018-10-27 NOTE — Telephone Encounter (Signed)
Patient advised per Dr Nicki Reaper: 1.  Low-salt diet                                                 2.  Increase HCT 25 mg daily would be wise                                                 Office visit early next week-it is necessary for [place] to do a follow-up                                                 May take 2 of her 12.5 mg tablets to equal 25 mg  Certainly if any worrisome symptoms such as chest pain shortness of breath or other worrisome symptoms ER otherwise follow-up early next week  Patient verbalized understanding and scheduled follow up office visit for next week.

## 2018-10-31 ENCOUNTER — Encounter: Payer: Self-pay | Admitting: Family Medicine

## 2018-10-31 ENCOUNTER — Ambulatory Visit: Payer: BLUE CROSS/BLUE SHIELD | Admitting: Family Medicine

## 2018-10-31 VITALS — BP 128/82 | Ht 65.0 in | Wt 213.4 lb

## 2018-10-31 DIAGNOSIS — R030 Elevated blood-pressure reading, without diagnosis of hypertension: Secondary | ICD-10-CM | POA: Diagnosis not present

## 2018-10-31 NOTE — Progress Notes (Signed)
   Subjective:    Patient ID: Kelsey Cooke, female    DOB: 24-Sep-1970, 48 y.o.   MRN: [id number]  Hypertension  Chronicity: follow up.  bp was up all last week. Pt has noticed her heart rate has been elevated some. She takes some deep breaths and it will go down. Thursday and Friday bp was  166/102, 152/85. Saturday 128/83.    Pt was in quite a rush last week  ++++++++ Had a lot going on   A nurse cked pts b p, elevated pwr nurse  Pt felt a little bit of heart flutter   Pt had shouldr inject and felt   Numbers were elev and cheeks and neck were flushed   Trouble sleeping at night time   After the shot had trouble calming her self down    fri at [place] the b was high  hct  Originally initiated for excess fluid and possibley b p  Pt gfelt everything tasted salty and felt dehydrated and felt tired on higher dose on the incr   Backed off to ha  Review of Systems No headache, no major weight loss or weight gain, no chest pain no back pain abdominal pain no change in bowel habits complete ROS otherwise negative     Objective:   Physical Exam  Alert vitals stable, NAD. Blood pressure good on repeat. HEENT normal. Lungs clear. Heart regular rate and rhythm.       Assessment & Plan:  #1 elevated blood pressure resolved at this time.  No recommendations as far as blood pressure checks exercise encouraged.  Salt intake discussed.  Patient now followed by specialist for her chronic joint and muscle challenges

## 2018-11-13 DIAGNOSIS — M67472 Ganglion, left ankle and foot: Secondary | ICD-10-CM | POA: Diagnosis not present

## 2018-12-22 DIAGNOSIS — S43431A Superior glenoid labrum lesion of right shoulder, initial encounter: Secondary | ICD-10-CM | POA: Insufficient documentation

## 2019-02-02 ENCOUNTER — Other Ambulatory Visit (HOSPITAL_COMMUNITY): Payer: Self-pay | Admitting: Adult Health

## 2019-02-02 DIAGNOSIS — Z1231 Encounter for screening mammogram for malignant neoplasm of breast: Secondary | ICD-10-CM

## 2019-02-12 ENCOUNTER — Other Ambulatory Visit: Payer: Self-pay

## 2019-02-12 ENCOUNTER — Ambulatory Visit (HOSPITAL_COMMUNITY)
Admission: RE | Admit: 2019-02-12 | Discharge: 2019-02-12 | Disposition: A | Discharge status: Home / Self Care | Payer: BC Managed Care – PPO | Source: Ambulatory Visit | Attending: Adult Health | Admitting: Adult Health

## 2019-02-12 DIAGNOSIS — Z1231 Encounter for screening mammogram for malignant neoplasm of breast: Secondary | ICD-10-CM | POA: Diagnosis not present

## 2019-02-13 ENCOUNTER — Other Ambulatory Visit (HOSPITAL_COMMUNITY): Payer: Self-pay | Admitting: Adult Health

## 2019-02-13 DIAGNOSIS — R928 Other abnormal and inconclusive findings on diagnostic imaging of breast: Secondary | ICD-10-CM

## 2019-02-27 ENCOUNTER — Ambulatory Visit (HOSPITAL_COMMUNITY)
Admission: RE | Admit: 2019-02-27 | Discharge: 2019-02-27 | Disposition: A | Discharge status: Home / Self Care | Payer: BC Managed Care – PPO | Source: Ambulatory Visit | Attending: Adult Health | Admitting: Adult Health

## 2019-02-27 ENCOUNTER — Other Ambulatory Visit: Payer: Self-pay

## 2019-02-27 DIAGNOSIS — N6011 Diffuse cystic mastopathy of right breast: Secondary | ICD-10-CM | POA: Diagnosis not present

## 2019-02-27 DIAGNOSIS — R928 Other abnormal and inconclusive findings on diagnostic imaging of breast: Secondary | ICD-10-CM | POA: Insufficient documentation

## 2019-02-27 DIAGNOSIS — N6001 Solitary cyst of right breast: Secondary | ICD-10-CM | POA: Diagnosis not present

## 2019-02-27 DIAGNOSIS — R922 Inconclusive mammogram: Secondary | ICD-10-CM | POA: Diagnosis not present

## 2019-02-27 DIAGNOSIS — N6311 Unspecified lump in the right breast, upper outer quadrant: Secondary | ICD-10-CM | POA: Diagnosis not present

## 2019-03-08 ENCOUNTER — Telehealth: Payer: Self-pay | Admitting: Adult Health

## 2019-03-08 NOTE — Telephone Encounter (Signed)

## 2019-03-09 ENCOUNTER — Other Ambulatory Visit: Payer: Self-pay

## 2019-03-09 ENCOUNTER — Encounter: Payer: Self-pay | Admitting: Adult Health

## 2019-03-09 ENCOUNTER — Ambulatory Visit (INDEPENDENT_AMBULATORY_CARE_PROVIDER_SITE_OTHER): Payer: BC Managed Care – PPO | Admitting: Adult Health

## 2019-03-09 VITALS — BP 142/90 | HR 76 | Ht 64.5 in | Wt 216.0 lb

## 2019-03-09 DIAGNOSIS — R12 Heartburn: Secondary | ICD-10-CM | POA: Insufficient documentation

## 2019-03-09 DIAGNOSIS — Z1212 Encounter for screening for malignant neoplasm of rectum: Secondary | ICD-10-CM | POA: Diagnosis not present

## 2019-03-09 DIAGNOSIS — Z975 Presence of (intrauterine) contraceptive device: Secondary | ICD-10-CM

## 2019-03-09 DIAGNOSIS — Z1211 Encounter for screening for malignant neoplasm of colon: Secondary | ICD-10-CM

## 2019-03-09 DIAGNOSIS — I10 Essential (primary) hypertension: Secondary | ICD-10-CM

## 2019-03-09 DIAGNOSIS — Z01419 Encounter for gynecological examination (general) (routine) without abnormal findings: Secondary | ICD-10-CM

## 2019-03-09 DIAGNOSIS — Z1329 Encounter for screening for other suspected endocrine disorder: Secondary | ICD-10-CM | POA: Insufficient documentation

## 2019-03-09 DIAGNOSIS — Z13220 Encounter for screening for lipoid disorders: Secondary | ICD-10-CM | POA: Insufficient documentation

## 2019-03-09 LAB — HEMOCCULT GUIAC POC 1CARD (OFFICE): Fecal Occult Blood, POC: NEGATIVE

## 2019-03-09 MED ORDER — PANTOPRAZOLE SODIUM 40 MG PO TBEC: 40.0000 mg | DELAYED_RELEASE_TABLET | Freq: Every day | ORAL | 3 refills | Status: DC

## 2019-03-09 MED ORDER — HYDROCHLOROTHIAZIDE 12.5 MG PO CAPS: 12.5000 mg | ORAL_CAPSULE | Freq: Every day | ORAL | 4 refills | Status: DC

## 2019-03-09 NOTE — Progress Notes (Signed)
Patient ID: Kelsey Cooke, female   DOB: 22-Apr-1971, 48 y.o.   MRN: [id number] History of Present Illness: Kelsey Cooke is a 48 year old white female, married, G3P3, in for a well woman gyn exam.She ha d a normal pap with negative HPV 11/16/16. She works at [employer]. PCP is Dr Mickie Hillier.   Current Medications, Allergies, Past Medical History, Past Surgical History, Family History and Social History were reviewed in [facility] record.     Review of Systems: Patient denies any headaches, hearing loss, fatigue, blurred vision, shortness of breath, chest pain, abdominal pain, problems with bowel movements, urination, or intercourse. No joint pain or mood swings. Has gained weight she says  +heartburn esp if eats pizza, which she had last night and is a little puffy today too.     Physical Exam:BP (!) 142/90 (BP Location: Left Arm, Patient Position: Sitting, Cuff Size: Large)   Pulse 76   Ht 5' 4.5" (1.638 m)   Wt 216 lb (98 kg)   BMI 36.50 kg/m  General:  Well developed, well nourished, no acute distress Skin:  Warm and dry Neck:  Midline trachea, normal thyroid, good ROM, no lymphadenopathy Lungs; Clear to auscultation bilaterally Breast:  No dominant palpable mass, retraction, or nipple discharge Cardiovascular: Regular rate and rhythm Abdomen:  Soft, non tender, no hepatosplenomegaly Pelvic:  External genitalia is normal in appearance, no lesions.  The vagina is normal in appearance. Urethra has no lesions or masses. The cervix is bulbous.+IUD strings.  Uterus is felt to be normal size, shape, and contour.  No adnexal masses or tenderness noted.Bladder is non tender, no masses felt. Rectal: Good sphincter tone, no polyps, or hemorrhoids felt.  Hemoccult negative. Extremities/musculoskeletal:  No swelling or varicosities noted, no clubbing or cyanosis Psych:  No mood changes, alert and cooperative,seems happy PHQ 2 score 0. Fall risk is low. Examination chaperoned  by Levy Pupa LPN.  Impression: 1. Encounter for well woman exam with routine gynecological exam   2. IUD (intrauterine device) in place   3. Screening for colorectal cancer   4. Screening for thyroid disorder   5. Screening cholesterol level   6. Essential hypertension   7. Heartburn       Plan:  Check CBC,CMP,TSH and lipids,free T4 Pap and physical in 1 year Mammogram yearly Colonoscopy at 48  Meds ordered this encounter  Medications  . pantoprazole (PROTONIX) 40 MG tablet    Sig: Take 1 tablet (40 mg total) by mouth daily.    Dispense:  30 tablet    Refill:  3    Order Specific Question:   Supervising Provider    Answer:   Elonda Husky, LUTHER H [[id number]]  . hydrochlorothiazide (MICROZIDE) 12.5 MG capsule    Sig: Take 1 capsule (12.5 mg total) by mouth daily.    Dispense:  90 capsule    Refill:  4    Please consider 90 day supplies to promote better adherence    Order Specific Question:   Supervising Provider    Answer:   Tania Ade H [[id number]]

## 2019-04-19 IMAGING — CT CT NECK W/ CM
3 of 4 series · 12 of 33 positions shown, 14 images · IV contrast (iopamidol)
Comparison: Thyroid ultrasound 09/07/2016

Cervical spine CT 08/27/2016

CLINICAL DATA: Thyroid nodules

EXAM:
CT NECK WITH CONTRAST
TECHNIQUE: Multidetector CT imaging of the neck was performed using the
standard protocol following the bolus administration of intravenous
contrast.
CONTRAST:  75mL 04BGWP-F99 IOPAMIDOL (04BGWP-F99) INJECTION 61%

[Series 2: axial neck · axial · 0.43mm/px · z∈[-182,-34]mm · 4 of 112 slices shown, 5 images]
[im 19/112  soft-tissue]
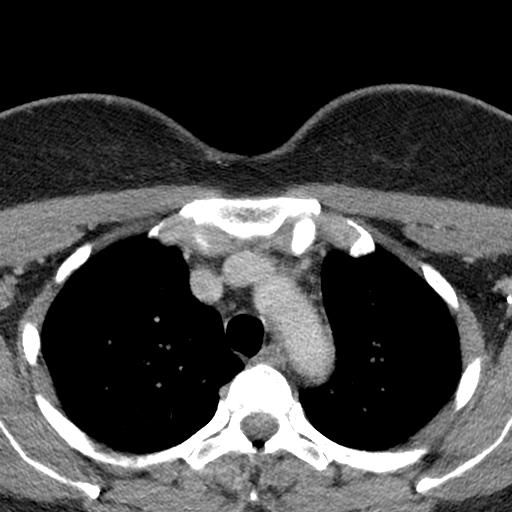
[im 19/112  bone]
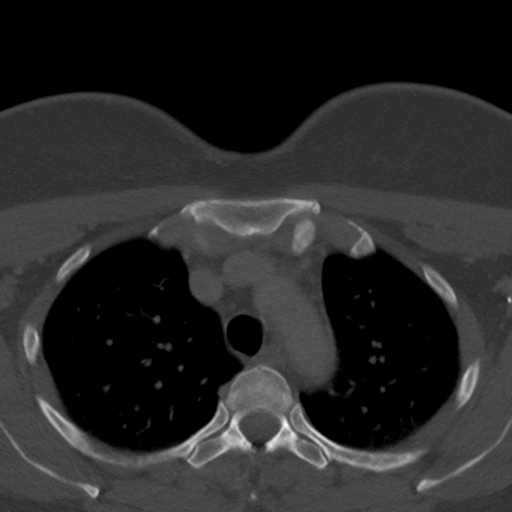
[im 38/112  bone]
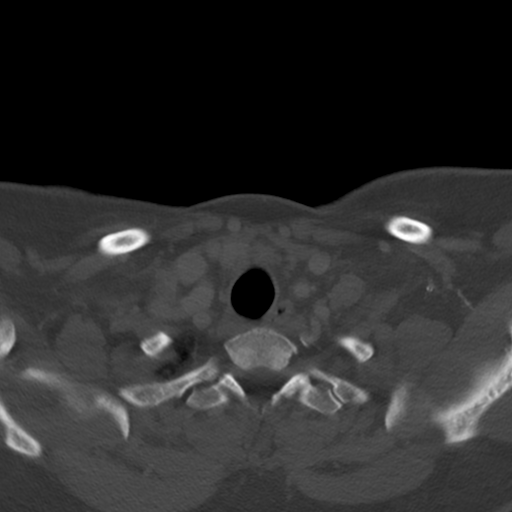
[im 75/112  bone]
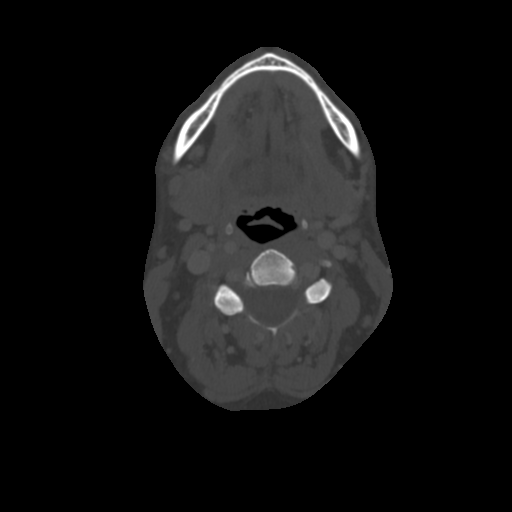
[im 93/112  bone]
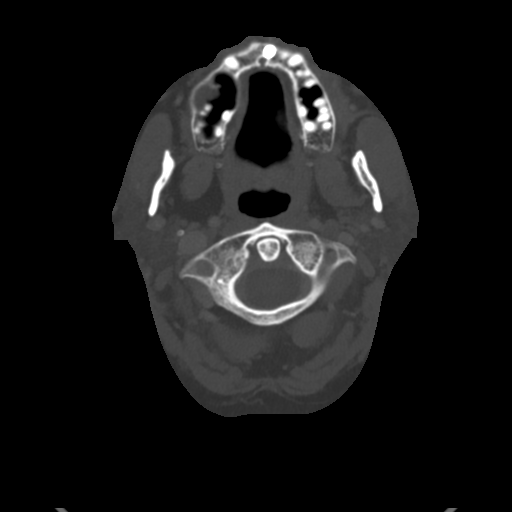

[Series 6: coronal neck · coronal · 0.33mm/px · 3 of 101 slices shown]
[im 21/101  bone]
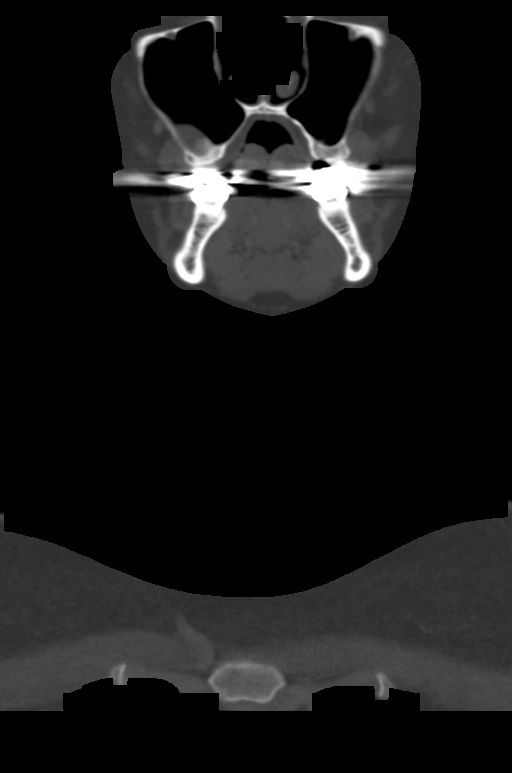
[im 41/101  bone]
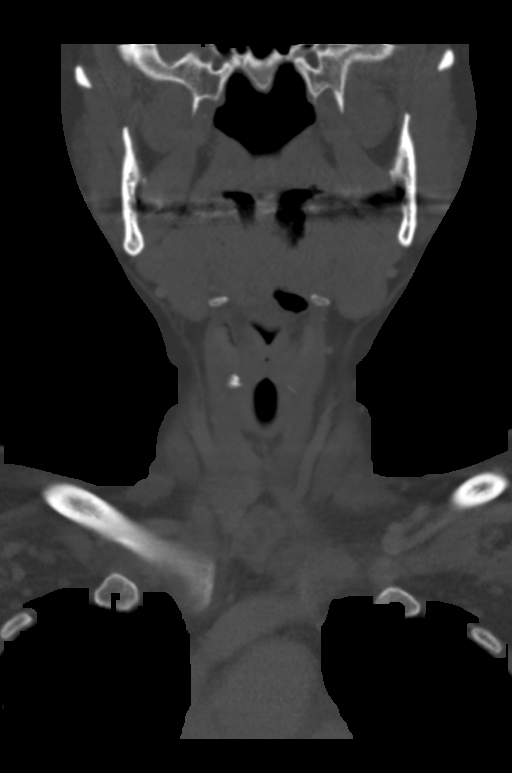
[im 61/101  bone]
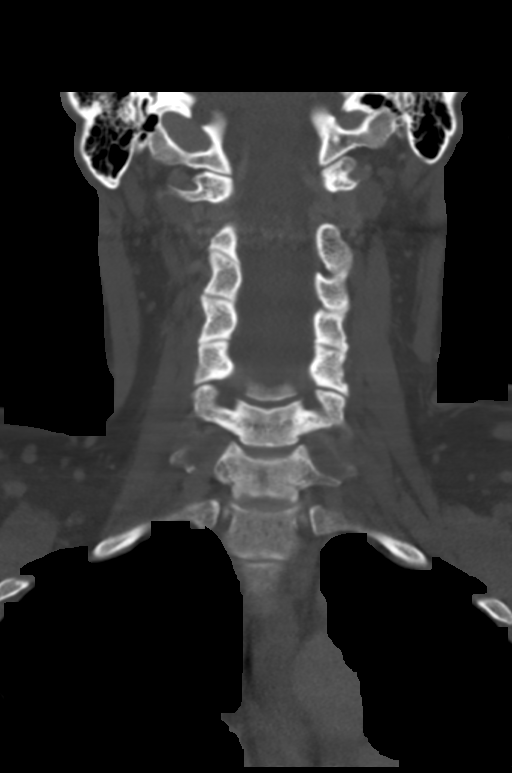

[Series 7: sagittal neck · sagittal · 0.42mm/px · 5 of 82 slices shown, 6 images]
[im 28/82  bone]
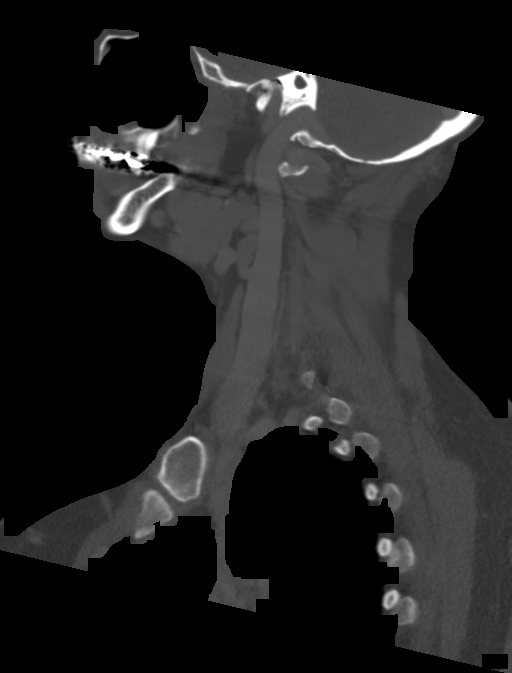
[im 34/82  bone]
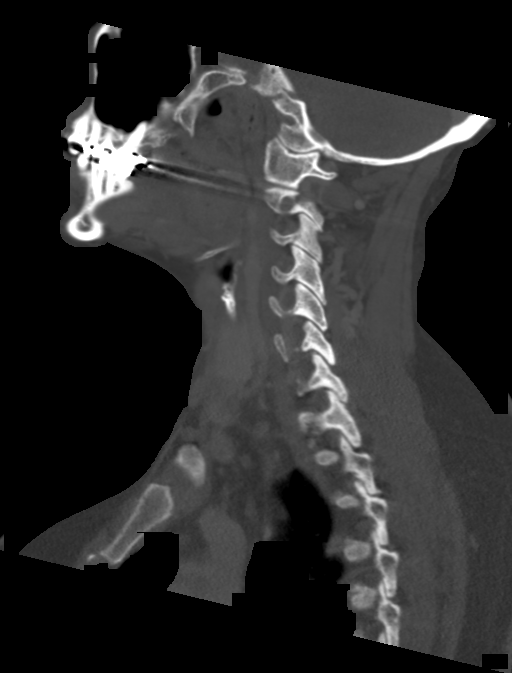
[im 41/82  soft-tissue]
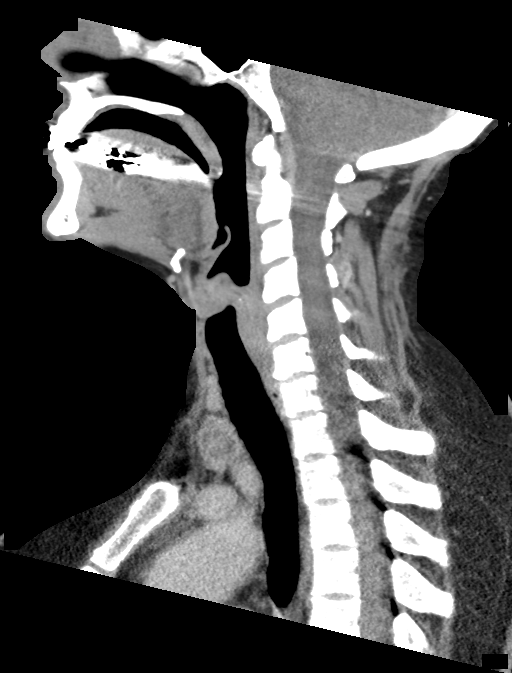
[im 41/82  bone]
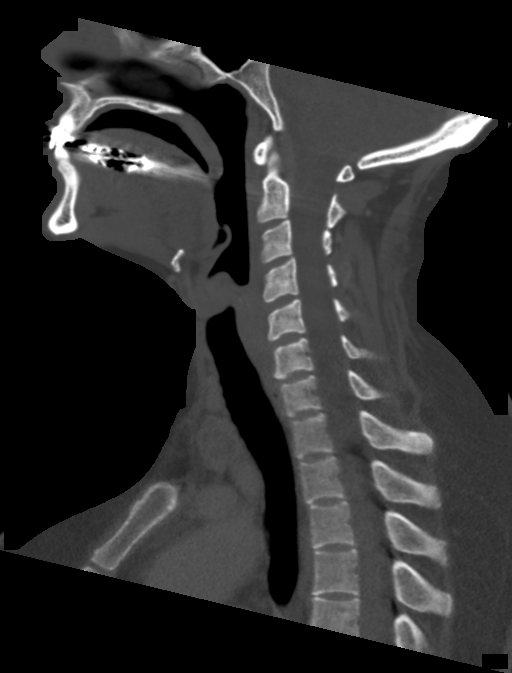
[im 48/82  bone]
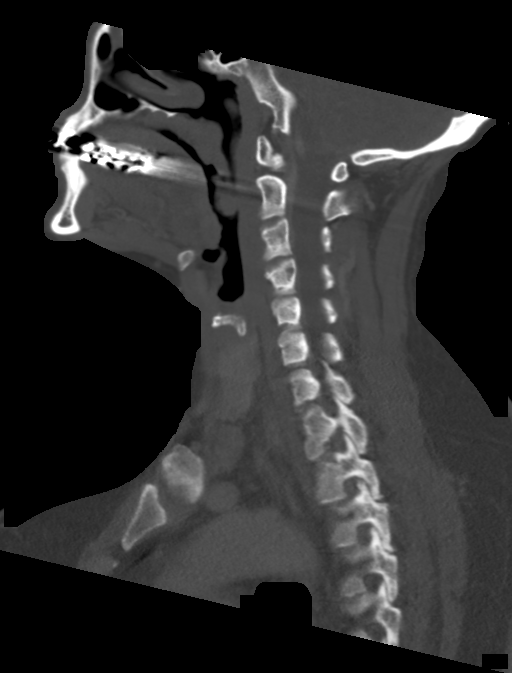
[im 55/82  bone]
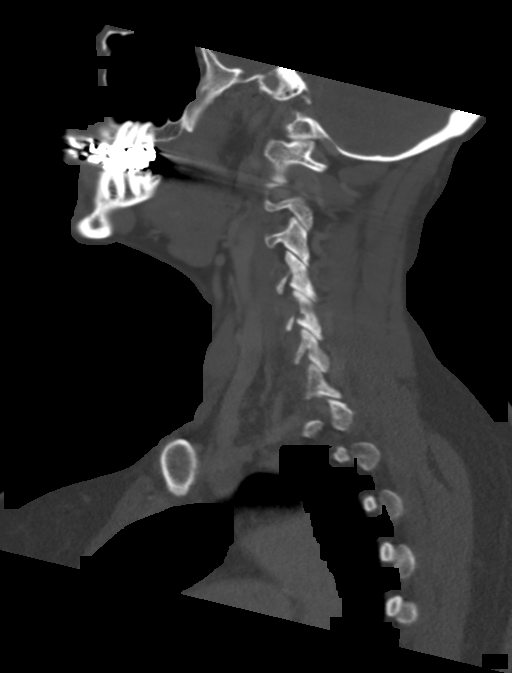

[12 of 33 positions shown; findings below may reference images not displayed]

FINDINGS: Pharynx and larynx:

--Nasopharynx: Fossae of [place] are clear. Normal adenoid
tonsils for age.

--Oral cavity and oropharynx: No visible lesion of the tongue or
floor of mouth. Normal lingual and palatine tonsils. Oropharynx is
clear.

--Hypopharynx: Normal vallecula and pyriform sinuses.

--Larynx: Normal epiglottis and pre-epiglottic space. Normal
aryepiglottic and vocal folds.

--Retropharyngeal space: No abscess, effusion or lymphadenopathy.

Salivary glands: The parotid, sublingual and submandibular glands
are normal. No sialolithiasis or salivary ductal dilatation.

Thyroid: Just inferior to the thyroid isthmus, there is again seen a
heterogeneous mass measuring 2.8 x 1.8 x 2.7 cm, unchanged in size.
The mass appears separate from the thyroid gland, which otherwise
appears normal.

Lymph nodes: No enlarged or abnormal density cervical lymph nodes.
There is a 9 mm left level IIa node.

Vascular: Normal

Limited intracranial: Normal

Visualized orbits: Not included

Mastoids and visualized paranasal sinuses: Clear

Skeleton: No acute or aggressive process.

Upper chest: Clear

Other: None
IMPRESSION: 1. Unchanged size of thoracic inlet mass, just inferior to the
thyroid isthmus. The appearance remains most suggestive of a thyroid
mass, though there is no clear connection between the lung and
within this mass. This may be a primary goiter arising in ectopic
thyroid tissue. Nuclear medicine thyroid scan might be helpful for
further evaluation, to see if the radiotracer uptake within this
mass matches that of the normal thyroid gland. Otherwise, histologic
sampling may be necessary.
2. No other abnormality of the neck.

## 2019-06-11 DIAGNOSIS — M25572 Pain in left ankle and joints of left foot: Secondary | ICD-10-CM | POA: Diagnosis not present

## 2019-07-02 ENCOUNTER — Encounter: Payer: Self-pay | Admitting: Family Medicine

## 2019-07-02 ENCOUNTER — Ambulatory Visit: Payer: BC Managed Care – PPO | Admitting: Family Medicine

## 2019-07-02 ENCOUNTER — Other Ambulatory Visit: Payer: Self-pay

## 2019-07-02 VITALS — BP 126/84 | Temp 98.1°F | Ht 64.5 in | Wt 218.0 lb

## 2019-07-02 DIAGNOSIS — R109 Unspecified abdominal pain: Secondary | ICD-10-CM

## 2019-07-02 LAB — POCT URINALYSIS DIPSTICK
Spec Grav, UA: <=1.005 — AB (ref 1.010–1.025)
pH, UA: 6 (ref 5.0–8.0)

## 2019-07-02 NOTE — Progress Notes (Signed)
   Subjective:    Patient ID: Kelsey Cooke, female    DOB: [birth year], 48 y.o.   MRN: [id number]  Abdominal Pain This is a new problem. Episode onset: 5 -6 days. Pain location: left side of abdomen.   Sitting down makes it worse but standing relieves the pain. Sometimes having a BM or burping makes it better. Stools are hard.   Pt having mpre pressure in the stomach like gas  Hard  m b obvements   Gas and burping   Hx of acid reflux    Discomfort left side   Worse in the last five d, fairly severe at times   satsat low and diffuse abd pain and bloating  No  Urinary isues    has genrealy not had constip issues     cultrelle not helping   Heat burn meds   Pt has occasional menstrua  cyce    Review of Systems  Gastrointestinal: Positive for abdominal pain.       Objective:   Physical Exam  Alert vitals stable, NAD. Blood pressure good on repeat. HEENT normal. Lungs clear. Heart regular rate and rhythm. Abdomen diffuse mild tenderness low abdomen to deep palpation.  Excellent bowel sounds no masses no rebound no guarding mild epigastric discomfort also      Assessment & Plan:  Impression 10 days of constipation with progressive bloating and abdominal discomfort.  Options discussed.  Major work-up of abdomen not warranted at this point.  Rationale discussed.  Initiate MiraLAX 1 scoop twice daily for 2 or 3 days then once daily after that

## 2019-09-14 ENCOUNTER — Telehealth: Payer: Self-pay | Admitting: Family Medicine

## 2019-09-14 ENCOUNTER — Other Ambulatory Visit: Payer: Self-pay | Admitting: Nurse Practitioner

## 2019-09-14 MED ORDER — PANTOPRAZOLE SODIUM 40 MG PO TBEC: 40.0000 mg | DELAYED_RELEASE_TABLET | Freq: Every day | ORAL | 2 refills | Status: DC

## 2019-09-14 NOTE — Telephone Encounter (Signed)
Done

## 2019-09-14 NOTE — Telephone Encounter (Signed)
Pt is needing refill on acid reflux medication.   [facility] [place], [place] - [id number]

## 2019-10-03 ENCOUNTER — Encounter: Payer: Self-pay | Admitting: Family Medicine

## 2019-10-29 DIAGNOSIS — E038 Other specified hypothyroidism: Secondary | ICD-10-CM | POA: Diagnosis not present

## 2019-10-29 DIAGNOSIS — E213 Hyperparathyroidism, unspecified: Secondary | ICD-10-CM | POA: Diagnosis not present

## 2019-12-05 ENCOUNTER — Other Ambulatory Visit: Payer: Self-pay | Admitting: *Deleted

## 2019-12-05 DIAGNOSIS — D225 Melanocytic nevi of trunk: Secondary | ICD-10-CM | POA: Diagnosis not present

## 2019-12-05 DIAGNOSIS — L820 Inflamed seborrheic keratosis: Secondary | ICD-10-CM | POA: Diagnosis not present

## 2019-12-05 DIAGNOSIS — Z1283 Encounter for screening for malignant neoplasm of skin: Secondary | ICD-10-CM | POA: Diagnosis not present

## 2019-12-05 DIAGNOSIS — D485 Neoplasm of uncertain behavior of skin: Secondary | ICD-10-CM | POA: Diagnosis not present

## 2019-12-05 MED ORDER — PANTOPRAZOLE SODIUM 40 MG PO TBEC: 40.0000 mg | DELAYED_RELEASE_TABLET | Freq: Every day | ORAL | 2 refills | Status: DC

## 2020-03-05 ENCOUNTER — Ambulatory Visit
Admission: EM | Admit: 2020-03-05 | Discharge: 2020-03-05 | Disposition: A | Discharge status: Home / Self Care | Payer: BC Managed Care – PPO | Attending: Emergency Medicine | Admitting: Emergency Medicine

## 2020-03-05 ENCOUNTER — Encounter: Payer: Self-pay | Admitting: Emergency Medicine

## 2020-03-05 DIAGNOSIS — M545 Low back pain, unspecified: Secondary | ICD-10-CM

## 2020-03-05 MED ORDER — ACETAMINOPHEN 500 MG PO TABS
500.0000 mg | ORAL_TABLET | Freq: Four times a day (QID) | ORAL | 0 refills | Status: DC | PRN
Start: 2020-03-05 — End: 2020-08-06

## 2020-03-05 MED ORDER — KETOROLAC TROMETHAMINE 30 MG/ML IJ SOLN
30.0000 mg | Freq: Once | INTRAMUSCULAR | Status: AC
  Administered 2020-03-05: 30 mg via INTRAMUSCULAR

## 2020-03-05 MED ORDER — DEXAMETHASONE SODIUM PHOSPHATE 10 MG/ML IJ SOLN
10.0000 mg | Freq: Once | INTRAMUSCULAR | Status: AC
  Administered 2020-03-05: 10 mg via INTRAMUSCULAR

## 2020-03-05 MED ORDER — DEXAMETHASONE SODIUM PHOSPHATE 10 MG/ML IJ SOLN: 10.0000 mg | Freq: Once | INTRAMUSCULAR | Status: DC

## 2020-03-05 MED ORDER — PREDNISONE 10 MG (21) PO TBPK
ORAL_TABLET | ORAL | 0 refills | Status: DC
Start: 2020-03-05 — End: 2020-06-25

## 2020-03-05 MED ORDER — CYCLOBENZAPRINE HCL 5 MG PO TABS: 5.0000 mg | ORAL_TABLET | Freq: Three times a day (TID) | ORAL | 0 refills | Status: DC | PRN

## 2020-03-05 NOTE — ED Provider Notes (Signed)
[place]   [id number] 03/05/20 Arrival Time: 6269   Chief Complaint  Patient presents with  . Back Pain     SUBJECTIVE: History from: patient.  Lanyiah Brix is a 49 y.o. female who presented to the urgent care for complaint of low back pain for the past 4 days.  Denies precipitating event.  She localizes the pain to the right low back.  She describes the pain as constant and achy.  She has tried OTC medications without relief.  Her symptoms are made worse with ROM.  She denies similar symptoms in the past.  Denies chills, fever, nausea, vomiting, diarrhea, paresthesia, numbness,  blurry vision, facial droop, LOC.  ROS: As per HPI.  All other pertinent ROS negative.     Past Medical History:  Diagnosis Date  . Allergic rhinitis   . Anxiety   . Breast tenderness in female 10/09/2015  . Cutaneous skin tags 03/27/2013   Had 2 minute skin tags on upper abdomen near breast removed  . Ear infection 11/13/2015  . Elevated BP 11/12/2014  . Fatigue 11/12/2014  . Fibroid 02/06/2016  . GERD (gastroesophageal reflux disease)   . Goiter   . Headache 01/08/2015  . Hemorrhoids 11/12/2014  . Hypertension   . Menorrhagia 11/08/2013  . Missed period 10/09/2015  . Pelvic pain in female 01/28/2016  . Rectocele 11/12/2014  . Sebaceous cyst of breast   . Sinus infection 11/08/2013   Past Surgical History:  Procedure Laterality Date  . goiter    . GUM SURGERY    . VARICOSE VEIN SURGERY    . warts     Allergies  Allergen Reactions  . Cefzil [Cefprozil] Other (See Comments)    Caused knot   No current facility-administered medications on file prior to encounter.   Current Outpatient Medications on File Prior to Encounter  Medication Sig Dispense Refill  . Diclofenac Sodium (PENNSAID) 2 % SOLN Pennsaid 20 mg/gram/actuation (2 %) topical soln in metered-dose pump  APPLY 2 PUMPS (40 MG) TO THE AFFECTED AREA BY TOPICAL ROUTE 2 TIMES PER DAY    . hydrochlorothiazide (MICROZIDE) 12.5 MG  capsule Take 1 capsule (12.5 mg total) by mouth daily. (Patient taking differently: Take 12.5 mg by mouth daily. Pt is not taking) 90 capsule 4  . levonorgestrel (MIRENA) 20 MCG/24HR IUD 1 each by Intrauterine route once.    . pantoprazole (PROTONIX) 40 MG tablet Take 1 tablet (40 mg total) by mouth daily. (Patient taking differently: Take 40 mg by mouth daily. Pt not taking) 30 tablet 2   Social History   Socioeconomic History  . Marital status: Married    Spouse name: Not on file  . Number of children: Not on file  . Years of education: Not on file  . Highest education level: Not on file  Occupational History  . Not on file  Tobacco Use  . Smoking status: Former Smoker    Types: Cigarettes  . Smokeless tobacco: Never Used  Vaping Use  . Vaping Use: Never used  Substance and Sexual Activity  . Alcohol use: No  . Drug use: No  . Sexual activity: Yes    Birth control/protection: Surgical, I.U.D.    Comment: vasectomy  Other Topics Concern  . Not on file  Social History Narrative  . Not on file   Social Determinants of Health   Financial Resource Strain:   . Difficulty of Paying Living Expenses:   Food Insecurity:   . Worried About Estate manager/land agent  of Food in the Last Year:   . [place] in the Last Year:   Transportation Needs:   . Lack of Transportation (Medical):   Marland Kitchen Lack of Transportation (Non-Medical):   Physical Activity:   . Days of Exercise per Week:   . Minutes of Exercise per Session:   Stress:   . Feeling of Stress :   Social Connections:   . Frequency of Communication with Friends and Family:   . Frequency of Social Gatherings with Friends and Family:   . Attends Religious Services:   . Active Member of Clubs or Organizations:   . Attends Archivist Meetings:   Marland Kitchen Marital Status:   Intimate Partner Violence:   . Fear of Current or Ex-Partner:   . Emotionally Abused:   Marland Kitchen Physically Abused:   . Sexually Abused:    Family History  Problem  Relation Age of Onset  . Diabetes Mother   . Hypertension Mother   . Cancer Maternal Grandmother        breast  . Hypertension Father   . Other Father        "weak heart"  . Hypertension Brother   . Cancer Sister        breast    OBJECTIVE:  Vitals:   03/05/20 1146 03/05/20 1147  BP: (!) 144/79   Resp: 16   Temp: 98.1 F (36.7 C)   TempSrc: Oral   SpO2: 96%   Weight:  178 lb (80.7 kg)     Physical Exam Vitals and nursing note reviewed.  Constitutional:      General: She is not in acute distress.    Appearance: Normal appearance. She is normal weight. She is not ill-appearing, toxic-appearing or diaphoretic.  Cardiovascular:     Rate and Rhythm: Normal rate and regular rhythm.     Pulses: Normal pulses.     Heart sounds: Normal heart sounds. No murmur heard.  No gallop.   Pulmonary:     Effort: Pulmonary effort is normal. No respiratory distress.     Breath sounds: Normal breath sounds. No stridor. No wheezing, rhonchi or rales.  Chest:     Chest wall: No tenderness.  Musculoskeletal:        General: Tenderness present.     Lumbar back: Spasms and tenderness present.     Comments: Back:  Patient ambulates from chair to exam table without difficulty.  Inspection: Skin clear and intact without obvious swelling, erythema, or ecchymosis. Warm to the touch  Palpation: Vertebral processes nontender. Tenderness about the right lower back ROM: FROM Strength: 5/5 hip flexion, 5/5 knee extension, 5/5 knee flexion, 5/5 plantar flexion, 5/5 dorsiflexion  DTR: Patellar tendon reflex intact  Special Tests: Negative Straight leg raise  Neurological:     Mental Status: She is alert.     LABS:  No results found for this or any previous visit (from the past 24 hour(s)).   ASSESSMENT & PLAN:  1. Acute right-sided low back pain without sciatica     Meds ordered this encounter  Medications  . dexamethasone (DECADRON) injection 10 mg  . ketorolac (TORADOL) 30 MG/ML  injection 30 mg  . predniSONE (STERAPRED UNI-PAK 21 TAB) 10 MG (21) TBPK tablet    Sig: Take 6 tabs by mouth daily  for 1 days, then 5 tabs for 1 days, then 4 tabs for 1 days, then 3 tabs for 1days, 2 tabs for 1 days, then 1 tab by mouth daily for  1 days    Dispense:  21 tablet    Refill:  0  . cyclobenzaprine (FLEXERIL) 5 MG tablet    Sig: Take 1 tablet (5 mg total) by mouth 3 (three) times daily as needed for muscle spasms.    Dispense:  30 tablet    Refill:  0  . acetaminophen (TYLENOL) 500 MG tablet    Sig: Take 1 tablet (500 mg total) by mouth every 6 (six) hours as needed.    Dispense:  30 tablet    Refill:  0    Discharge instructions.   Rest, ice and heat as needed Ensure adequate ROM as tolerated. Prescribed Tylenol for  pain relief Prescribed prednisone for inflammation Prescribed flexeril  for muscle spasm.  Do not drive or operate heavy machinery while taking this medication Return here or go to ER if you have any new or worsening symptoms such as numbness/tingling of the inner thighs, loss of bladder or bowel control, headache/blurry vision, nausea/vomiting, confusion/altered mental status, dizziness, weakness, passing out, imbalance, etc...    Reviewed expectations re: course of current medical issues. Questions answered. Outlined signs and symptoms indicating need for more acute intervention. Patient verbalized understanding. After Visit Summary given.      Note: This document was prepared using Dragon voice recognition software and may include unintentional dictation errors.    Emerson Monte, FNP 03/05/20 1203

## 2020-03-05 NOTE — ED Triage Notes (Signed)
Lower back pain since Saturday.  They have been tearing down a pool.

## 2020-03-05 NOTE — Discharge Instructions (Addendum)
Rest, ice and heat as needed Ensure adequate ROM as tolerated. Prescribed Tylenol for  pain relief Prescribed prednisone for inflammation Prescribed flexeril  for muscle spasm.  Do not drive or operate heavy machinery while taking this medication Return here or go to ER if you have any new or worsening symptoms such as numbness/tingling of the inner thighs, loss of bladder or bowel control, headache/blurry vision, nausea/vomiting, confusion/altered mental status, dizziness, weakness, passing out, imbalance, etc..Marland Kitchen

## 2020-03-07 ENCOUNTER — Encounter: Payer: Self-pay | Admitting: Adult Health

## 2020-03-07 ENCOUNTER — Other Ambulatory Visit (HOSPITAL_COMMUNITY)
Admission: RE | Admit: 2020-03-07 | Discharge: 2020-03-07 | Disposition: A | Discharge status: Home / Self Care | Payer: BC Managed Care – PPO | Source: Ambulatory Visit | Attending: Adult Health | Admitting: Adult Health

## 2020-03-07 ENCOUNTER — Ambulatory Visit (INDEPENDENT_AMBULATORY_CARE_PROVIDER_SITE_OTHER): Payer: BC Managed Care – PPO | Admitting: Adult Health

## 2020-03-07 ENCOUNTER — Other Ambulatory Visit: Payer: Self-pay

## 2020-03-07 VITALS — BP 149/93 | HR 64 | Ht 64.0 in | Wt 181.0 lb

## 2020-03-07 DIAGNOSIS — Z30430 Encounter for insertion of intrauterine contraceptive device: Secondary | ICD-10-CM

## 2020-03-07 DIAGNOSIS — Z01419 Encounter for gynecological examination (general) (routine) without abnormal findings: Secondary | ICD-10-CM | POA: Insufficient documentation

## 2020-03-07 DIAGNOSIS — Z975 Presence of (intrauterine) contraceptive device: Secondary | ICD-10-CM | POA: Diagnosis not present

## 2020-03-07 DIAGNOSIS — Z1211 Encounter for screening for malignant neoplasm of colon: Secondary | ICD-10-CM | POA: Insufficient documentation

## 2020-03-07 DIAGNOSIS — I10 Essential (primary) hypertension: Secondary | ICD-10-CM | POA: Diagnosis not present

## 2020-03-07 LAB — HEMOCCULT GUIAC POC 1CARD (OFFICE): Fecal Occult Blood, POC: NEGATIVE

## 2020-03-07 MED ORDER — HYDROCHLOROTHIAZIDE 12.5 MG PO CAPS: 12.5000 mg | ORAL_CAPSULE | Freq: Every day | ORAL | 4 refills | Status: DC

## 2020-03-07 NOTE — Progress Notes (Signed)
Patient ID: Kelsey Cooke, female   DOB: 08/20/1971, 49 y.o.   MRN: [id number] History of Present Illness: Kelsey Cooke is a 49 year old white female,married, G3P3, has IUD, in for well woman gyn exam and pap. She works at [employer]. PCP is Dr Wolfgang Phoenix.   Current Medications, Allergies, Past Medical History, Past Surgical History, Family History and Social History were reviewed in [employer] record.     Review of Systems: Patient denies any headaches, hearing loss, fatigue, blurred vision, shortness of breath, chest pain, abdominal pain, problems with bowel movements, urination, or intercourse. No joint pain or mood swings. Having some hot flashes. Has hurt back and is on meds No periods with IUD Has lost 40 lbs on Optavia    Physical Exam:BP (!) 149/93 (BP Location: Left Arm, Patient Position: Sitting, Cuff Size: Normal)   Pulse 64   Ht 5\' 4"  (1.626 m)   Wt 181 lb (82.1 kg)   BMI 31.07 kg/m  General:  Well developed, well nourished, no acute distress Skin:  Warm and dry Neck:  Midline trachea,  Thyroid goiter, good ROM, no lymphadenopathy Lungs; Clear to auscultation bilaterally Breast:  No dominant palpable mass, retraction, or nipple discharge Cardiovascular: Regular rate and rhythm Abdomen:  Soft, non tender, no hepatosplenomegaly Pelvic:  External genitalia is normal in appearance, no lesions.  The vagina is normal in appearance. Urethra has no lesions or masses. The cervix is bulbous. +IUD strings at os, Pap with high risk HPV 16/18 genotyping performed. Uterus is felt to be normal size, shape, and contour.  No adnexal masses or tenderness noted.Bladder is non tender, no masses felt. Rectal: Good sphincter tone, no polyps, or hemorrhoids felt.  Hemoccult negative. Extremities/musculoskeletal:  No swelling or varicosities noted, no clubbing or cyanosis Psych:  No mood changes, alert and cooperative,seems happy AA is 1 Fall risk is low PHQ 9 score is 1  [place] -  03/07/20 1259      Pregnancy Intention Screening   Does the patient want to become pregnant in the next year? No    Does the patient's partner want to become pregnant in the next year? No    Would the patient like to discuss contraceptive options today? No      Contraception Wrap Up   Current Method IUD or IUS    End Method IUD or IUS    Contraception Counseling Provided No         Examination chaperoned by Rolena Infante LPN  Impression and Plan: 1. Encounter for gynecological examination with Papanicolaou smear of cervix Pap sent Physical in 1 year Pap in 3 if normal Get mammogram  Call if wants labs   2. IUD (intrauterine device) in place Placed 12/23/16  3. Encounter for screening fecal occult blood testing  4. Essential hypertension Take BP meds  Meds ordered this encounter  Medications  . hydrochlorothiazide (MICROZIDE) 12.5 MG capsule    Sig: Take 1 capsule (12.5 mg total) by mouth daily.    Dispense:  90 capsule    Refill:  4    Please consider 90 day supplies to promote better adherence    Order Specific Question:   Supervising Provider    Answer:   Elonda Husky, LUTHER H [[id number]]    5. Screening for colorectal cancer Referred to Dr Gala Romney for colonoscopy

## 2020-03-10 ENCOUNTER — Telehealth: Payer: Self-pay | Admitting: Adult Health

## 2020-03-10 ENCOUNTER — Other Ambulatory Visit: Payer: BC Managed Care – PPO | Admitting: Adult Health

## 2020-03-10 ENCOUNTER — Encounter: Payer: Self-pay | Admitting: Internal Medicine

## 2020-03-10 ENCOUNTER — Other Ambulatory Visit (INDEPENDENT_AMBULATORY_CARE_PROVIDER_SITE_OTHER): Payer: BC Managed Care – PPO | Admitting: *Deleted

## 2020-03-10 DIAGNOSIS — R350 Frequency of micturition: Secondary | ICD-10-CM

## 2020-03-10 DIAGNOSIS — R300 Dysuria: Secondary | ICD-10-CM

## 2020-03-10 LAB — POCT URINALYSIS DIPSTICK
Blood, UA: NEGATIVE
Glucose, UA: NEGATIVE
Ketones, UA: NEGATIVE
Leukocytes, UA: NEGATIVE
Nitrite, UA: NEGATIVE
Protein, UA: NEGATIVE

## 2020-03-10 NOTE — Progress Notes (Addendum)
   NURSE VISIT- UTI SYMPTOMS   SUBJECTIVE:  Kelsey Cooke is a 49 y.o. G3P3 female here for UTI symptoms. She is a GYN patient. She reports urinary frequency and burning with urination.  OBJECTIVE:  There were no vitals taken for this visit.  Appears well, in no apparent distress  Results for orders placed or performed in visit on 03/10/20 (from the past 24 hour(s))  POCT Urinalysis Dipstick   Collection Time: 03/10/20 10:42 AM  Result Value Ref Range   Color, UA     Clarity, UA     Glucose, UA Negative Negative   Bilirubin, UA     Ketones, UA neg    Spec Grav, UA     Blood, UA neg    pH, UA     Protein, UA Negative Negative   Urobilinogen, UA     Nitrite, UA neg    Leukocytes, UA Negative Negative   Appearance     Odor      ASSESSMENT: GYN patient with UTI symptoms and negative nitrites  PLAN: Note routed to Knute Neu, CNM, [facility]   Rx sent by provider today: No Urine culture sent Call or return to clinic prn if these symptoms worsen or fail to improve as anticipated. Follow-up: as needed   Levy Pupa  03/10/2020 10:47 AM  Chart reviewed for nurse visit. Agree with plan of care.  Roma Schanz, [place] 03/10/2020 1:15 PM

## 2020-03-10 NOTE — Telephone Encounter (Signed)
Pt having urinary symptoms. I advised pt to come in and leave a urine sample. Pt voiced understanding and call was transferred to front desk for appt. [place]

## 2020-03-10 NOTE — Telephone Encounter (Signed)
Came in for pap on Friday/ woke up Sat with burning/ and frequent urination. Please call and advise

## 2020-03-11 LAB — CYTOLOGY - PAP
Comment: NEGATIVE
Diagnosis: NEGATIVE
High risk HPV: NEGATIVE

## 2020-03-12 ENCOUNTER — Telehealth: Payer: Self-pay | Admitting: Adult Health

## 2020-03-12 MED ORDER — NITROFURANTOIN MONOHYD MACRO 100 MG PO CAPS
100.0000 mg | ORAL_CAPSULE | Freq: Two times a day (BID) | ORAL | 0 refills | Status: DC
Start: 2020-03-12 — End: 2020-06-25

## 2020-03-12 MED ORDER — PHENAZOPYRIDINE HCL 200 MG PO TABS
200.0000 mg | ORAL_TABLET | Freq: Three times a day (TID) | ORAL | 0 refills | Status: DC | PRN
Start: 2020-03-12 — End: 2020-06-25

## 2020-03-12 NOTE — Telephone Encounter (Signed)
Kelsey Cooke is complaining of UF and burning, preliminary culture + E coli, will Rx Macrobid and pyridium push water

## 2020-03-12 NOTE — Addendum Note (Signed)
Addended by: Derrek Monaco A on: 03/12/2020 04:12 PM   Modules accepted: Orders

## 2020-03-12 NOTE — Telephone Encounter (Signed)
Pt calling got her urine results on my chart and all was neg but she is still burning and have to go back to bathroom as soon as she goes. Is having some pain and discomfort. Please call and advise

## 2020-03-14 LAB — URINALYSIS, ROUTINE W REFLEX MICROSCOPIC
Bilirubin, UA: NEGATIVE
Glucose, UA: NEGATIVE
Ketones, UA: NEGATIVE
Leukocytes,UA: NEGATIVE
Nitrite, UA: NEGATIVE
Protein,UA: NEGATIVE
RBC, UA: NEGATIVE
Specific Gravity, UA: <=1.005 — AB (ref 1.005–1.030)
Urobilinogen, Ur: 0.2 mg/dL (ref 0.2–1.0)
pH, UA: 7 (ref 5.0–7.5)

## 2020-03-14 LAB — URINE CULTURE

## 2020-03-14 LAB — SPECIMEN STATUS REPORT

## 2020-04-14 ENCOUNTER — Telehealth: Payer: Self-pay | Admitting: Family Medicine

## 2020-04-14 NOTE — Telephone Encounter (Signed)
Left message to return call 

## 2020-04-14 NOTE — Telephone Encounter (Signed)
Never been seen.  Needs to establish care. Thx. Dr. Lovena Le

## 2020-04-14 NOTE — Telephone Encounter (Signed)
Pt is needing a referral for ENT Dr Ishmael Holter. She is having trouble with her vertigo again.   Pt has appt for Monday to see ENT

## 2020-04-16 NOTE — Telephone Encounter (Signed)
Left message to schedule appointment

## 2020-04-16 NOTE — Telephone Encounter (Signed)
Please make pt appt to establish care

## 2020-04-28 ENCOUNTER — Ambulatory Visit: Payer: BC Managed Care – PPO

## 2020-05-05 ENCOUNTER — Ambulatory Visit
Admission: EM | Admit: 2020-05-05 | Discharge: 2020-05-05 | Disposition: A | Discharge status: Home / Self Care | Payer: BC Managed Care – PPO | Attending: Emergency Medicine | Admitting: Emergency Medicine

## 2020-05-05 DIAGNOSIS — Z1152 Encounter for screening for COVID-19: Secondary | ICD-10-CM

## 2020-05-08 LAB — NOVEL CORONAVIRUS, NAA: SARS-CoV-2, NAA: NOT DETECTED

## 2020-05-13 DIAGNOSIS — H9041 Sensorineural hearing loss, unilateral, right ear, with unrestricted hearing on the contralateral side: Secondary | ICD-10-CM | POA: Diagnosis not present

## 2020-05-13 DIAGNOSIS — R42 Dizziness and giddiness: Secondary | ICD-10-CM | POA: Diagnosis not present

## 2020-06-10 ENCOUNTER — Ambulatory Visit: Payer: BC Managed Care – PPO

## 2020-06-11 DIAGNOSIS — R42 Dizziness and giddiness: Secondary | ICD-10-CM | POA: Diagnosis not present

## 2020-06-17 DIAGNOSIS — R42 Dizziness and giddiness: Secondary | ICD-10-CM | POA: Diagnosis not present

## 2020-06-25 ENCOUNTER — Ambulatory Visit (INDEPENDENT_AMBULATORY_CARE_PROVIDER_SITE_OTHER): Payer: Self-pay | Admitting: *Deleted

## 2020-06-25 ENCOUNTER — Other Ambulatory Visit: Payer: Self-pay

## 2020-06-25 VITALS — Ht 65.0 in | Wt 183.4 lb

## 2020-06-25 DIAGNOSIS — Z1211 Encounter for screening for malignant neoplasm of colon: Secondary | ICD-10-CM

## 2020-06-25 MED ORDER — NA SULFATE-K SULFATE-MG SULF 17.5-3.13-1.6 GM/177ML PO SOLN
1.0000 | Freq: Once | ORAL | 0 refills | Status: AC
Start: 2020-06-25 — End: 2020-06-25

## 2020-06-25 NOTE — Progress Notes (Signed)
Gastroenterology Pre-Procedure Review  Request Date:  06/25/2020 Requesting Physician: Dr. Wolfgang Phoenix, Last TCS over 10 years ago, pt thinks we did it, no record of it in Epic, pt thinks she had polyps  PATIENT REVIEW QUESTIONS: The patient responded to the following health history questions as indicated:    1. Diabetes Melitis: no 2. Joint replacements in the past 12 months: no 3. Major health problems in the past 3 months: no 4. Has an artificial valve or MVP: no 5. Has a defibrillator: no 6. Has been advised in past to take antibiotics in advance of a procedure like teeth cleaning: no 7. Family history of colon cancer: no 8. Alcohol Use: no 9. Illicit drug Use: no 10. History of sleep apnea: no  11. History of coronary artery or other vascular stents placed within the last 12 months: no 12. History of any prior anesthesia complications: no 13. Body mass index is 30.52 kg/m.    MEDICATIONS & ALLERGIES:    Patient reports the following regarding taking any blood thinners:   Plavix? no Aspirin? no Coumadin? no Brilinta? no Xarelto? no Eliquis? no Pradaxa? no Savaysa? no Effient? no  Patient confirms/reports the following medications:  Current Outpatient Medications  Medication Sig Dispense Refill  . acetaminophen (TYLENOL) 500 MG tablet Take 1 tablet (500 mg total) by mouth every 6 (six) hours as needed. (Patient taking differently: Take 500 mg by mouth as needed. ) 30 tablet 0  . cyclobenzaprine (FLEXERIL) 5 MG tablet Take 1 tablet (5 mg total) by mouth 3 (three) times daily as needed for muscle spasms. (Patient taking differently: Take 5 mg by mouth as needed for muscle spasms. ) 30 tablet 0  . Diclofenac Sodium (PENNSAID) 2 % SOLN as needed.     . hydrochlorothiazide (MICROZIDE) 12.5 MG capsule Take 1 capsule (12.5 mg total) by mouth daily. 90 capsule 4  . levonorgestrel (MIRENA) 20 MCG/24HR IUD 1 each by Intrauterine route once.    . RESTASIS 0.05 % ophthalmic emulsion 1  drop 2 (two) times daily.     No current facility-administered medications for this visit.    Patient confirms/reports the following allergies:  Allergies  Allergen Reactions  . Cefzil [Cefprozil] Other (See Comments)    Caused knot    No orders of the defined types were placed in this encounter.   AUTHORIZATION INFORMATION Primary Insurance: [place],  [place] #: [id number],  Group #: [id number] Pre-Cert / Auth required: No, not required  SCHEDULE INFORMATION: Procedure has been scheduled as follows:  Date: 08/06/2020, Time: 1:00 Location: [facility] with Dr. Gala Romney  This Gastroenterology Pre-Precedure Review Form is being routed to the following provider(s): Roseanne Kaufman, NP

## 2020-06-25 NOTE — Patient Instructions (Signed)
Kelsey Cooke  1970/12/30 MRN: [id number]     Procedure Date: 08/06/2020 Time to register: 12:00 pm Place to register: [facility] Procedure Time: 1:00 pm Scheduled provider: Dr. Gala Romney    PREPARATION FOR COLONOSCOPY WITH SUPREP BOWEL PREP KIT  Note: Suprep Bowel Prep Kit is a split-dose (2day) regimen. Consumption of BOTH 6-ounce bottles is required for a complete prep.  Please notify us immediately if you are diabetic, take iron supplements, or if you are on Coumadin or any other blood thinners.  Please hold the following medications: n/a                                                                                                                                                  2 DAYS BEFORE PROCEDURE:  DATE: 08/04/2020   DAY: Monday Begin clear liquid diet AFTER your lunch meal. NO SOLID FOODS after this point.  1 DAY BEFORE PROCEDURE:  DATE: 08/05/2020   DAY: Tuesday Continue clear liquids the entire day - NO SOLID FOOD.   Diabetic medications adjustments for today: n/a  At 6:00pm: Complete steps 1 through 4 below, using ONE (1) 6-ounce bottle, before going to bed. Step 1:  Pour ONE (1) 6-ounce bottle of SUPREP liquid into the mixing container.  Step 2:  Add cool drinking water to the 16 ounce line on the container and mix.  Note: Dilute the solution concentrate as directed prior to use. Step 3:  DRINK ALL the liquid in the container. Step 4:  You MUST drink an additional two (2) or more 16 ounce containers of water over the next one (1) hour.   Continue clear liquids.  DAY OF PROCEDURE:   DATE: 08/06/2020   DAY: Wednesday If you take medications for your heart, blood pressure, or breathing, you may take these medications.  Diabetic medications adjustments for today: n/a  5 hours before your procedure at :  8:00 am Step 1:  Pour ONE (1) 6-ounce bottle of SUPREP liquid into the mixing container.  Step 2:  Add cool drinking water to the 16 ounce line on the  container and mix.  Note: Dilute the solution concentrate as directed prior to use. Step 3:  DRINK ALL the liquid in the container. Step 4:  You MUST drink an additional two (2) or more 16 ounce containers of water over the next one (1) hour. You MUST complete the final glass of water at least 3 hours before your colonoscopy. Nothing by mouth past 10:00 am.  You may take your morning medications with sip of water unless we have instructed otherwise.    Please see below for Dietary Information.  CLEAR LIQUIDS INCLUDE:  Water Jello (NOT red in color)   Ice Popsicles (NOT red in color)   Tea (sugar ok, no milk/cream) Powdered fruit flavored drinks  Coffee (sugar ok,  no milk/cream) Gatorade/ Lemonade/ Kool-Aid  (NOT red in color)   Juice: apple, white grape, white cranberry Soft drinks  Clear bullion, consomme, broth (fat free beef/chicken/vegetable)  Carbonated beverages (any kind)  Strained chicken noodle soup Hard Candy   Remember: Clear liquids are liquids that will allow you to see your fingers on the other side of a clear glass. Be sure liquids are NOT red in color, and not cloudy, but CLEAR.  DO NOT EAT OR DRINK ANY OF THE FOLLOWING:  Dairy products of any kind   Cranberry juice Tomato juice / V8 juice   Grapefruit juice Orange juice     Red grape juice  Do not eat any solid foods, including such foods as: cereal, oatmeal, yogurt, fruits, vegetables, creamed soups, eggs, bread, crackers, pureed foods in a blender, etc.   HELPFUL HINTS FOR DRINKING PREP SOLUTION:   Make sure prep is extremely cold. Mix and refrigerate the the morning of the prep. You may also put in the freezer.   You may try mixing some Crystal Light or Country Time Lemonade if you prefer. Mix in small amounts; add more if necessary.  Try drinking through a straw  Rinse mouth with water or a mouthwash between glasses, to remove after-taste.  Try sipping on a cold beverage /ice/ popsicles between glasses of  prep.  Place a piece of sugar-free hard candy in mouth between glasses.  If you become nauseated, try consuming smaller amounts, or stretch out the time between glasses. Stop for 30-60 minutes, then slowly start back drinking.     OTHER INSTRUCTIONS  You will need a responsible adult at least 50 years of age to accompany you and drive you home. This person must remain in the waiting room during your procedure. The hospital will cancel your procedure if you do not have a responsible adult with you.   1. Wear loose fitting clothing that is easily removed. 2. Leave jewelry and other valuables at home.  3. Remove all body piercing jewelry and leave at home. 4. Total time from sign-in until discharge is approximately 2-3 hours. 5. You should go home directly after your procedure and rest. You can resume normal activities the day after your procedure. 6. The day of your procedure you should not:  Drive  Make legal decisions  Operate machinery  Drink alcohol  Return to work   You may call the office (Dept: [phone]) before 5:00pm, or page the doctor on call ([phone]) after 5:00pm, for further instructions, if necessary.   Insurance Information YOU WILL NEED TO CHECK WITH YOUR INSURANCE COMPANY FOR THE BENEFITS OF COVERAGE YOU HAVE FOR THIS PROCEDURE.  UNFORTUNATELY, NOT ALL INSURANCE COMPANIES HAVE BENEFITS TO COVER ALL OR PART OF THESE TYPES OF PROCEDURES.  IT IS YOUR RESPONSIBILITY TO CHECK YOUR BENEFITS, HOWEVER, WE WILL BE GLAD TO ASSIST YOU WITH ANY CODES YOUR INSURANCE COMPANY MAY NEED.    PLEASE NOTE THAT MOST INSURANCE COMPANIES WILL NOT COVER A SCREENING COLONOSCOPY FOR PEOPLE UNDER THE AGE OF 50  IF YOU HAVE BCBS INSURANCE, YOU MAY HAVE BENEFITS FOR A SCREENING COLONOSCOPY BUT IF POLYPS ARE FOUND THE DIAGNOSIS WILL CHANGE AND THEN YOU MAY HAVE A DEDUCTIBLE THAT WILL NEED TO BE MET. SO PLEASE MAKE SURE YOU CHECK YOUR BENEFITS FOR A SCREENING COLONOSCOPY AS WELL AS A  DIAGNOSTIC COLONOSCOPY.      

## 2020-06-27 NOTE — Progress Notes (Signed)
OK to schedule. Conscious sedation. ASA II. 

## 2020-08-04 ENCOUNTER — Other Ambulatory Visit (HOSPITAL_COMMUNITY)
Admission: RE | Admit: 2020-08-04 | Discharge: 2020-08-04 | Disposition: A | Discharge status: Home / Self Care | Payer: BC Managed Care – PPO | Source: Ambulatory Visit | Attending: Internal Medicine | Admitting: Internal Medicine

## 2020-08-04 ENCOUNTER — Telehealth: Payer: Self-pay | Admitting: Internal Medicine

## 2020-08-04 ENCOUNTER — Other Ambulatory Visit: Payer: Self-pay

## 2020-08-04 DIAGNOSIS — Z01812 Encounter for preprocedural laboratory examination: Secondary | ICD-10-CM | POA: Insufficient documentation

## 2020-08-04 DIAGNOSIS — Z20822 Contact with and (suspected) exposure to covid-19: Secondary | ICD-10-CM | POA: Insufficient documentation

## 2020-08-04 DIAGNOSIS — K644 Residual hemorrhoidal skin tags: Secondary | ICD-10-CM | POA: Diagnosis not present

## 2020-08-04 DIAGNOSIS — K641 Second degree hemorrhoids: Secondary | ICD-10-CM | POA: Diagnosis not present

## 2020-08-04 DIAGNOSIS — K5730 Diverticulosis of large intestine without perforation or abscess without bleeding: Secondary | ICD-10-CM | POA: Diagnosis not present

## 2020-08-04 DIAGNOSIS — Z79899 Other long term (current) drug therapy: Secondary | ICD-10-CM | POA: Diagnosis not present

## 2020-08-04 DIAGNOSIS — Z87891 Personal history of nicotine dependence: Secondary | ICD-10-CM | POA: Diagnosis not present

## 2020-08-04 DIAGNOSIS — Z1211 Encounter for screening for malignant neoplasm of colon: Secondary | ICD-10-CM | POA: Diagnosis not present

## 2020-08-04 DIAGNOSIS — D120 Benign neoplasm of cecum: Secondary | ICD-10-CM | POA: Diagnosis not present

## 2020-08-04 DIAGNOSIS — K621 Rectal polyp: Secondary | ICD-10-CM | POA: Diagnosis not present

## 2020-08-04 DIAGNOSIS — K635 Polyp of colon: Secondary | ICD-10-CM | POA: Diagnosis not present

## 2020-08-04 NOTE — Telephone Encounter (Signed)
[place] EXPENSIVE AND NEEDS AN ALTERNATIVE

## 2020-08-04 NOTE — Telephone Encounter (Signed)
Spoke to pt and she is going to go online to see if she can get a discount code to use for Suprep.  She said that she would call me back if she needed it switched to something else.

## 2020-08-05 LAB — SARS CORONAVIRUS 2 (TAT 6-24 HRS): SARS Coronavirus 2: NEGATIVE

## 2020-08-05 NOTE — Telephone Encounter (Signed)
Spoke to pt and she got her RX for Suprep filled so no need to change RX.

## 2020-08-06 ENCOUNTER — Encounter (HOSPITAL_COMMUNITY): Payer: Self-pay | Admitting: Internal Medicine

## 2020-08-06 ENCOUNTER — Encounter (HOSPITAL_COMMUNITY)
Admission: RE | Disposition: A | Discharge status: Home / Self Care | Payer: Self-pay | Source: Home / Self Care | Attending: Internal Medicine

## 2020-08-06 ENCOUNTER — Other Ambulatory Visit: Payer: Self-pay

## 2020-08-06 ENCOUNTER — Ambulatory Visit (HOSPITAL_COMMUNITY)
Admission: RE | Admit: 2020-08-06 | Discharge: 2020-08-06 | Disposition: A | Discharge status: Home / Self Care | Payer: BC Managed Care – PPO | Attending: Internal Medicine | Admitting: Internal Medicine

## 2020-08-06 DIAGNOSIS — K641 Second degree hemorrhoids: Secondary | ICD-10-CM | POA: Insufficient documentation

## 2020-08-06 DIAGNOSIS — K621 Rectal polyp: Secondary | ICD-10-CM

## 2020-08-06 DIAGNOSIS — Z20822 Contact with and (suspected) exposure to covid-19: Secondary | ICD-10-CM | POA: Insufficient documentation

## 2020-08-06 DIAGNOSIS — K644 Residual hemorrhoidal skin tags: Secondary | ICD-10-CM | POA: Diagnosis not present

## 2020-08-06 DIAGNOSIS — Z1211 Encounter for screening for malignant neoplasm of colon: Secondary | ICD-10-CM | POA: Diagnosis not present

## 2020-08-06 DIAGNOSIS — D120 Benign neoplasm of cecum: Secondary | ICD-10-CM | POA: Diagnosis not present

## 2020-08-06 DIAGNOSIS — K635 Polyp of colon: Secondary | ICD-10-CM

## 2020-08-06 DIAGNOSIS — K5730 Diverticulosis of large intestine without perforation or abscess without bleeding: Secondary | ICD-10-CM | POA: Diagnosis not present

## 2020-08-06 DIAGNOSIS — Z87891 Personal history of nicotine dependence: Secondary | ICD-10-CM | POA: Diagnosis not present

## 2020-08-06 DIAGNOSIS — Z79899 Other long term (current) drug therapy: Secondary | ICD-10-CM | POA: Insufficient documentation

## 2020-08-06 HISTORY — PX: POLYPECTOMY: SHX5525

## 2020-08-06 HISTORY — PX: COLONOSCOPY: SHX5424

## 2020-08-06 SURGERY — COLONOSCOPY
Anesthesia: Moderate Sedation

## 2020-08-06 MED ORDER — ONDANSETRON HCL 4 MG/2ML IJ SOLN
INTRAMUSCULAR | Status: DC | PRN
  Administered 2020-08-06: 4 mg via INTRAVENOUS

## 2020-08-06 MED ORDER — ONDANSETRON HCL 4 MG/2ML IJ SOLN
INTRAMUSCULAR | Status: AC
  Filled 2020-08-06: qty 2

## 2020-08-06 MED ORDER — SODIUM CHLORIDE 0.9 % IV SOLN: INTRAVENOUS | Status: DC

## 2020-08-06 MED ORDER — MIDAZOLAM HCL 5 MG/5ML IJ SOLN
INTRAMUSCULAR | Status: DC | PRN
  Administered 2020-08-06: 1 mg via INTRAVENOUS
  Administered 2020-08-06: 2 mg via INTRAVENOUS
  Administered 2020-08-06 (×4): 1 mg via INTRAVENOUS
  Administered 2020-08-06: 2 mg via INTRAVENOUS
  Administered 2020-08-06: 1 mg via INTRAVENOUS

## 2020-08-06 MED ORDER — MEPERIDINE HCL 100 MG/ML IJ SOLN
INTRAMUSCULAR | Status: DC | PRN
  Administered 2020-08-06: 15 mg via INTRAVENOUS
  Administered 2020-08-06: 10 mg via INTRAVENOUS
  Administered 2020-08-06: 25 mg via INTRAVENOUS

## 2020-08-06 MED ORDER — MIDAZOLAM HCL 5 MG/5ML IJ SOLN
INTRAMUSCULAR | Status: AC
  Filled 2020-08-06: qty 10

## 2020-08-06 MED ORDER — STERILE WATER FOR IRRIGATION IR SOLN
Status: DC | PRN
  Administered 2020-08-06: 500 mL

## 2020-08-06 MED ORDER — MEPERIDINE HCL 50 MG/ML IJ SOLN
INTRAMUSCULAR | Status: AC
  Filled 2020-08-06: qty 1

## 2020-08-06 NOTE — Discharge Instructions (Signed)
Colonoscopy Discharge Instructions  Read the instructions outlined below and refer to this sheet in the next few weeks. These discharge instructions provide you with general information on caring for yourself after you leave the hospital. Your doctor may also give you specific instructions. While your treatment has been planned according to the most current medical practices available, unavoidable complications occasionally occur. If you have any problems or questions after discharge, call Dr. Gala Romney at [phone]. ACTIVITY  You may resume your regular activity, but move at a slower pace for the next 24 hours.   Take frequent rest periods for the next 24 hours.   Walking will help get rid of the air and reduce the bloated feeling in your belly (abdomen).   No driving for 24 hours (because of the medicine (anesthesia) used during the test).    Do not sign any important legal documents or operate any machinery for 24 hours (because of the anesthesia used during the test).  NUTRITION  Drink plenty of fluids.   You may resume your normal diet as instructed by your doctor.   Begin with a light meal and progress to your normal diet. Heavy or fried foods are harder to digest and may make you feel sick to your stomach (nauseated).   Avoid alcoholic beverages for 24 hours or as instructed.  MEDICATIONS  You may resume your normal medications unless your doctor tells you otherwise.  WHAT YOU CAN EXPECT TODAY  Some feelings of bloating in the abdomen.   Passage of more gas than usual.   Spotting of blood in your stool or on the toilet paper.  IF YOU HAD POLYPS REMOVED DURING THE COLONOSCOPY:  No aspirin products for 7 days or as instructed.   No alcohol for 7 days or as instructed.   Eat a soft diet for the next 24 hours.  FINDING OUT THE RESULTS OF YOUR TEST Not all test results are available during your visit. If your test results are not back during the visit, make an appointment  with your caregiver to find out the results. Do not assume everything is normal if you have not heard from your caregiver or the medical facility. It is important for you to follow up on all of your test results.  SEEK IMMEDIATE MEDICAL ATTENTION IF:  You have more than a spotting of blood in your stool.   Your belly is swollen (abdominal distention).   You are nauseated or vomiting.   You have a temperature over 101.   You have abdominal pain or discomfort that is severe or gets worse throughout the day.   Minimal diverticulosis.  3 polyps removed from your colon today   Information on polyps and diverticulosis provided.  Further recommendations to follow pending review of pathology report  At patient request, I called Lysbeth Galas at [phone] -got voicemail.  Left message.   Diverticulosis  Diverticulosis is a condition that develops when small pouches (diverticula) form in the wall of the large intestine (colon). The colon is where water is absorbed and stool (feces) is formed. The pouches form when the inside layer of the colon pushes through weak spots in the outer layers of the colon. You may have a few pouches or many of them. The pouches usually do not cause problems unless they become inflamed or infected. When this happens, the condition is called diverticulitis. What are the causes? The cause of this condition is not known. What increases the risk? The following factors may make you  more likely to develop this condition:  Being older than age 40. Your risk for this condition increases with age. Diverticulosis is rare among people younger than age 75. By age 17, many people have it.  Eating a low-fiber diet.  Having frequent constipation.  Being overweight.  Not getting enough exercise.  Smoking.  Taking over-the-counter pain medicines, like aspirin and ibuprofen.  Having a family history of diverticulosis. What are the signs or symptoms? In most people,  there are no symptoms of this condition. If you do have symptoms, they may include:  Bloating.  Cramps in the abdomen.  Constipation or diarrhea.  Pain in the lower left side of the abdomen. How is this diagnosed? Because diverticulosis usually has no symptoms, it is most often diagnosed during an exam for other colon problems. The condition may be diagnosed by:  Using a flexible scope to examine the colon (colonoscopy).  Taking an X-ray of the colon after dye has been put into the colon (barium enema).  Having a CT scan. How is this treated? You may not need treatment for this condition. Your health care provider may recommend treatment to prevent problems. You may need treatment if you have symptoms or if you previously had diverticulitis. Treatment may include:  Eating a high-fiber diet.  Taking a fiber supplement.  Taking a live bacteria supplement (probiotic).  Taking medicine to relax your colon. Follow these instructions at home: Medicines  Take over-the-counter and prescription medicines only as told by your health care provider.  If told by your health care provider, take a fiber supplement or probiotic. Constipation prevention Your condition may cause constipation. To prevent or treat constipation, you may need to:  Drink enough fluid to keep your urine pale yellow.  Take over-the-counter or prescription medicines.  Eat foods that are high in fiber, such as beans, whole grains, and fresh fruits and vegetables.  Limit foods that are high in fat and processed sugars, such as fried or sweet foods.  General instructions  Try not to strain when you have a bowel movement.  Keep all follow-up visits as told by your health care provider. This is important. Contact a health care provider if you:  Have pain in your abdomen.  Have bloating.  Have cramps.  Have not had a bowel movement in 3 days. Get help right away if:  Your pain gets worse.  Your bloating  becomes very bad.  You have a fever or chills, and your symptoms suddenly get worse.  You vomit.  You have bowel movements that are bloody or black.  You have bleeding from your rectum. Summary  Diverticulosis is a condition that develops when small pouches (diverticula) form in the wall of the large intestine (colon).  You may have a few pouches or many of them.  This condition is most often diagnosed during an exam for other colon problems.  Treatment may include increasing the fiber in your diet, taking supplements, or taking medicines. This information is not intended to replace advice given to you by your health care provider. Make sure you discuss any questions you have with your health care provider. Document Revised: 03/15/2019 Document Reviewed: 03/15/2019 Elsevier Patient Education  [place].  Colon Polyps  Polyps are tissue growths inside the body. Polyps can grow in many places, including the large intestine (colon). A polyp may be a round bump or a mushroom-shaped growth. You could have one polyp or several. Most colon polyps are noncancerous (benign). However, some  colon polyps can become cancerous over time. Finding and removing the polyps early can help prevent this. What are the causes? The exact cause of colon polyps is not known. What increases the risk? You are more likely to develop this condition if you:  Have a family history of colon cancer or colon polyps.  Are older than 35 or older than 45 if you are African American.  Have inflammatory bowel disease, such as ulcerative colitis or Crohn's disease.  Have certain hereditary conditions, such as: ? Familial adenomatous polyposis. ? Lynch syndrome. ? Turcot syndrome. ? Peutz-Jeghers syndrome.  Are overweight.  Smoke cigarettes.  Do not get enough exercise.  Drink too much alcohol.  Eat a diet that is high in fat and red meat and low in fiber.  Had childhood cancer that was treated  with abdominal radiation. What are the signs or symptoms? Most polyps do not cause symptoms. If you have symptoms, they may include:  Blood coming from your rectum when having a bowel movement.  Blood in your stool. The stool may look dark red or black.  Abdominal pain.  A change in bowel habits, such as constipation or diarrhea. How is this diagnosed? This condition is diagnosed with a colonoscopy. This is a procedure in which a lighted, flexible scope is inserted into the anus and then passed into the colon to examine the area. Polyps are sometimes found when a colonoscopy is done as part of routine cancer screening tests. How is this treated? Treatment for this condition involves removing any polyps that are found. Most polyps can be removed during a colonoscopy. Those polyps will then be tested for cancer. Additional treatment may be needed depending on the results of testing. Follow these instructions at home: Lifestyle  Maintain a healthy weight, or lose weight if recommended by your health care provider.  Exercise every day or as told by your health care provider.  Do not use any products that contain nicotine or tobacco, such as cigarettes and e-cigarettes. If you need help quitting, ask your health care provider.  If you drink alcohol, limit how much you have: ? 0-1 drink a day for women. ? 0-2 drinks a day for men.  Be aware of how much alcohol is in your drink. In the [place] one drink equals one 12 oz bottle of beer (355 mL), one 5 oz glass of wine (148 mL), or one 1 oz shot of hard liquor (44 mL). Eating and drinking   Eat foods that are high in fiber, such as fruits, vegetables, and whole grains.  Eat foods that are high in calcium and vitamin D, such as milk, cheese, yogurt, eggs, liver, fish, and broccoli.  Limit foods that are high in fat, such as fried foods and desserts.  Limit the amount of red meat and processed meat you eat, such as hot dogs, sausage,  bacon, and lunch meats. General instructions  Keep all follow-up visits as told by your health care provider. This is important. ? This includes having regularly scheduled colonoscopies. ? Talk to your health care provider about when you need a colonoscopy. Contact a health care provider if:  You have new or worsening bleeding during a bowel movement.  You have new or increased blood in your stool.  You have a change in bowel habits.  You lose weight for no known reason. Summary  Polyps are tissue growths inside the body. Polyps can grow in many places, including the colon.  Most colon polyps  are noncancerous (benign), but some can become cancerous over time.  This condition is diagnosed with a colonoscopy.  Treatment for this condition involves removing any polyps that are found. Most polyps can be removed during a colonoscopy. This information is not intended to replace advice given to you by your health care provider. Make sure you discuss any questions you have with your health care provider. Document Revised: 12/01/2017 Document Reviewed: 12/01/2017 Elsevier Patient Education  [place].

## 2020-08-06 NOTE — OR Nursing (Signed)
Kelsey Cooke was at [facility] on 08/06/20 for a procedure and cannot return to work until 2:00PM on 08/07/20.

## 2020-08-06 NOTE — Op Note (Signed)
[facility] Patient Name: Kelsey Cooke Procedure Date: 08/06/2020 11:32 AM MRN: [id number] Date of Birth: 1971-05-01 Attending MD: Norvel Richards , MD CSN: [id number] Age: 49 Admit Type: Outpatient Procedure:                Colonoscopy Indications:              Screening for colorectal malignant neoplasm Providers:                Norvel Richards, MD, Lurline Del, RN, Dereck Leep, Technician, Nelma Rothman, Technician Referring MD:              Medicines:                Midazolam 10 mg IV, Meperidine 50 mg IV Complications:            No immediate complications. Estimated Blood Loss:     Estimated blood loss was minimal. Procedure:                Pre-Anesthesia Assessment:                           - Prior to the procedure, a History and Physical                            was performed, and patient medications and                            allergies were reviewed. The patient's tolerance of                            previous anesthesia was also reviewed. The risks                            and benefits of the procedure and the sedation                            options and risks were discussed with the patient.                            All questions were answered, and informed consent                            was obtained. Prior Anticoagulants: The patient has                            taken no previous anticoagulant or antiplatelet                            agents. ASA Grade Assessment: II - A patient with                            mild systemic disease. After reviewing the risks  and benefits, the patient was deemed in                            satisfactory condition to undergo the procedure.                           After obtaining informed consent, the colonoscope                            was passed under direct vision. Throughout the                            procedure, the patient's  blood pressure, pulse, and                            oxygen saturations were monitored continuously. The                            CF-HQ190L ([id number]) scope was introduced through                            the anus and advanced to the the cecum, identified                            by appendiceal orifice and ileocecal valve. The                            colonoscopy was performed without difficulty. The                            patient tolerated the procedure well. The quality                            of the bowel preparation was adequate. Scope In: 12:51:56 PM Scope Out: 1:15:57 PM Scope Withdrawal Time: 0 hours 12 minutes 28 seconds  Total Procedure Duration: 0 hours 24 minutes 1 second  Findings:      The perianal and digital rectal examinations were normal.      Three semi-pedunculated polyps were found in the distal rectum,       descending colon and ileocecal valve. The polyps were 4 to 7 mm in size.       These polyps were removed with a cold snare. Resection and retrieval       were complete. Estimated blood loss was minimal.      Non-bleeding external and internal hemorrhoids were found during       retroflexion. The hemorrhoids were mild, small and Grade II (internal       hemorrhoids that prolapse but reduce spontaneously).      The exam was otherwise without abnormality on direct and retroflexion       views.      A few medium-mouthed diverticula were found in the ascending colon. Impression:               - Three 4 to 7 mm polyps in the distal rectum, in  the descending colon and at the ileocecal valve,                            removed with a cold snare. Resected and retrieved.                           - Non-bleeding external and internal hemorrhoids.                           - The examination was otherwise normal on direct                            and retroflexion views. Mild                             diverticulosis?"right-sided. Moderate Sedation:      Moderate (conscious) sedation was administered by the endoscopy nurse       and supervised by the endoscopist. The following parameters were       monitored: oxygen saturation, heart rate, blood pressure, respiratory       rate, EKG, adequacy of pulmonary ventilation, and response to care.       Total physician intraservice time was 30 minutes. Recommendation:           - Patient has a contact number available for                            emergencies. The signs and symptoms of potential                            delayed complications were discussed with the                            patient. Return to normal activities tomorrow.                            Written discharge instructions were provided to the                            patient.                           - Resume previous diet.                           - Continue present medications.                           - Repeat colonoscopy date to be determined after                            pending pathology results are reviewed for                            surveillance.                           -  Return to GI office (date not yet determined). Procedure Code(s):        --- Professional ---                           [phone], Colonoscopy, flexible; with removal of                            tumor(s), polyp(s), or other lesion(s) by snare                            technique                           99153, Moderate sedation; each additional 15                            minutes intraservice time                           G0500, Moderate sedation services provided by the                            same physician or other qualified health care                            professional performing a gastrointestinal                            endoscopic service that sedation supports,                            requiring the presence of an independent trained                             observer to assist in the monitoring of the                            patient's level of consciousness and physiological                            status; initial 15 minutes of intra-service time;                            patient age 67 years or older (additional time may                            be reported with [phone], as appropriate) Diagnosis Code(s):        --- Professional ---                           Z12.11, Encounter for screening for malignant                            neoplasm of colon  K62.1, Rectal polyp                           K63.5, Polyp of colon                           K64.1, Second degree hemorrhoids CPT copyright 2019 American Medical Association. All rights reserved. The codes documented in this report are preliminary and upon coder review may  be revised to meet current compliance requirements. Cristopher Estimable. Emmani Lesueur, MD Norvel Richards, MD 08/06/2020 1:28:14 PM This report has been signed electronically. Number of Addenda: 0

## 2020-08-06 NOTE — H&P (Addendum)
@LOGO @   Primary Care Physician:  Erven Colla, DO Primary Gastroenterologist:  Dr. Gala Romney  Pre-Procedure History & Physical: HPI:  Kelsey Cooke is a 49 y.o. female here for average risk screening colonoscopy.  Reports a colonoscopy for possibly rectal bleeding Forestine Na many years ago (greater than 10).  May have had polyps.  Does not recall any specific recommendations and records are not available.  She reports father is surveilled periodically because of colon polyps initially diagnosed in his 34s.  Past Medical History:  Diagnosis Date  . Allergic rhinitis   . Anxiety   . Breast tenderness in female 10/09/2015  . Cutaneous skin tags 03/27/2013   Had 2 minute skin tags on upper abdomen near breast removed  . Ear infection 11/13/2015  . Elevated BP 11/12/2014  . Fatigue 11/12/2014  . Fibroid 02/06/2016  . GERD (gastroesophageal reflux disease)   . Goiter   . Headache 01/08/2015  . Hemorrhoids 11/12/2014  . Hypertension   . Menorrhagia 11/08/2013  . Missed period 10/09/2015  . Pelvic pain in female 01/28/2016  . Rectocele 11/12/2014  . Sebaceous cyst of breast   . Sinus infection 11/08/2013    Past Surgical History:  Procedure Laterality Date  . goiter    . GUM SURGERY    . VARICOSE VEIN SURGERY    . warts      Prior to Admission medications   Medication Sig Start Date End Date Taking? Authorizing Provider  acetaminophen (TYLENOL) 325 MG tablet Take 650 mg by mouth 2 (two) times daily as needed (for pain.).   Yes [provider]  Diclofenac Sodium (PENNSAID) 2 % SOLN as needed.    Yes [provider]  hydrochlorothiazide (MICROZIDE) 12.5 MG capsule Take 1 capsule (12.5 mg total) by mouth daily. 03/07/20  Yes Estill Dooms, NP  levonorgestrel (MIRENA) 20 MCG/24HR IUD 1 each by Intrauterine route once.   Yes [provider]  RESTASIS 0.05 % ophthalmic emulsion Place 1 drop into both eyes 2 (two) times daily as needed (dry/irritated eyes.).   01/09/20  Yes [provider]  acetaminophen (TYLENOL) 500 MG tablet Take 1 tablet (500 mg total) by mouth every 6 (six) hours as needed. Patient not taking: Reported on 08/01/2020 03/05/20   Emerson Monte, FNP  cyclobenzaprine (FLEXERIL) 5 MG tablet Take 1 tablet (5 mg total) by mouth 3 (three) times daily as needed for muscle spasms. Patient not taking: Reported on 08/01/2020 03/05/20   Avegno, Darrelyn Hillock, FNP  SUPREP BOWEL PREP KIT 17.5-3.13-1.6 GM/177ML SOLN Take 354 mLs by mouth as directed. 07/29/20   [provider]    Allergies as of 06/30/2020 - Review Complete 06/25/2020  Allergen Reaction Noted  . Cefzil [cefprozil] Other (See Comments) 08/28/2015    Family History  Problem Relation Age of Onset  . Diabetes Mother   . Hypertension Mother   . Cancer Maternal Grandmother        breast  . Hypertension Father   . Other Father        "weak heart"  . Hypertension Brother   . Cancer Sister        breast    Social History   Socioeconomic History  . Marital status: Married    Spouse name: Not on file  . Number of children: Not on file  . Years of education: Not on file  . Highest education level: Not on file  Occupational History  . Not on file  Tobacco Use  .  Smoking status: Former Smoker    Types: Cigarettes  . Smokeless tobacco: Never Used  Vaping Use  . Vaping Use: Never used  Substance and Sexual Activity  . Alcohol use: No  . Drug use: No  . Sexual activity: Yes    Birth control/protection: Surgical, I.U.D.    Comment: vasectomy  Other Topics Concern  . Not on file  Social History Narrative  . Not on file   Social Determinants of Health   Financial Resource Strain: Low Risk   . Difficulty of Paying Living Expenses: Not very hard  Food Insecurity: No Food Insecurity  . Worried About Charity fundraiser in the Last Year: Never true  . Ran Out of Food in the Last Year: Never true  Transportation Needs: No Transportation Needs  . Lack  of Transportation (Medical): No  . Lack of Transportation (Non-Medical): No  Physical Activity: Insufficiently Active  . Days of Exercise per Week: 3 days  . Minutes of Exercise per Session: 30 min  Stress: Stress Concern Present  . Feeling of Stress : To some extent  Social Connections: Moderately Integrated  . Frequency of Communication with Friends and Family: Three times a week  . Frequency of Social Gatherings with Friends and Family: Three times a week  . Attends Religious Services: More than 4 times per year  . Active Member of Clubs or Organizations: No  . Attends Archivist Meetings: Never  . Marital Status: Married  Human resources officer Violence: Not At Risk  . Fear of Current or Ex-Partner: No  . Emotionally Abused: No  . Physically Abused: No  . Sexually Abused: No    Review of Systems: See HPI, otherwise negative ROS  Physical Exam: There were no vitals taken for this visit. General:   Alert,  Well-developed, well-nourished, pleasant and cooperative in NAD Neck:  Supple; no masses or thyromegaly. No significant cervical adenopathy. Lungs:  Clear throughout to auscultation.   No wheezes, crackles, or rhonchi. No acute distress. Heart:  Regular rate and rhythm; no murmurs, clicks, rubs,  or gallops. Abdomen: Non-distended, normal bowel sounds.  Soft and nontender without appreciable mass or hepatosplenomegaly.  Pulses:  Normal pulses noted. Extremities:  Without clubbing or edema.  Impression/Plan: 49 year old lady here for average risk screening colonoscopy although she does report history of polyps in her father. I have offered the patient a screening colonoscopy today. The risks, benefits, limitations, alternatives and imponderables have been reviewed with the patient. Questions have been answered. All parties are agreeable.      Notice: This dictation was prepared with Dragon dictation along with smaller phrase technology. Any transcriptional errors that  result from this process are unintentional and may not be corrected upon review.

## 2020-08-08 LAB — SURGICAL PATHOLOGY

## 2020-08-09 ENCOUNTER — Encounter: Payer: Self-pay | Admitting: Internal Medicine

## 2020-08-12 ENCOUNTER — Encounter (HOSPITAL_COMMUNITY): Payer: Self-pay | Admitting: Internal Medicine

## 2020-08-28 ENCOUNTER — Other Ambulatory Visit (HOSPITAL_COMMUNITY): Payer: Self-pay | Admitting: Adult Health

## 2020-08-28 DIAGNOSIS — Z1231 Encounter for screening mammogram for malignant neoplasm of breast: Secondary | ICD-10-CM

## 2020-08-30 ENCOUNTER — Encounter (HOSPITAL_COMMUNITY): Payer: Self-pay | Admitting: Emergency Medicine

## 2020-08-30 ENCOUNTER — Other Ambulatory Visit: Payer: Self-pay

## 2020-08-30 ENCOUNTER — Emergency Department (HOSPITAL_COMMUNITY)
Admission: EM | Admit: 2020-08-30 | Discharge: 2020-08-30 | Disposition: A | Discharge status: Home / Self Care | Payer: BC Managed Care – PPO | Attending: Emergency Medicine | Admitting: Emergency Medicine

## 2020-08-30 ENCOUNTER — Emergency Department (HOSPITAL_COMMUNITY): Payer: BC Managed Care – PPO

## 2020-08-30 DIAGNOSIS — K219 Gastro-esophageal reflux disease without esophagitis: Secondary | ICD-10-CM | POA: Diagnosis not present

## 2020-08-30 DIAGNOSIS — R1013 Epigastric pain: Secondary | ICD-10-CM | POA: Diagnosis not present

## 2020-08-30 DIAGNOSIS — K8020 Calculus of gallbladder without cholecystitis without obstruction: Secondary | ICD-10-CM

## 2020-08-30 DIAGNOSIS — Z87891 Personal history of nicotine dependence: Secondary | ICD-10-CM | POA: Diagnosis not present

## 2020-08-30 DIAGNOSIS — R109 Unspecified abdominal pain: Secondary | ICD-10-CM | POA: Diagnosis not present

## 2020-08-30 DIAGNOSIS — R1010 Upper abdominal pain, unspecified: Secondary | ICD-10-CM

## 2020-08-30 LAB — URINALYSIS, ROUTINE W REFLEX MICROSCOPIC
Bilirubin Urine: NEGATIVE
Glucose, UA: NEGATIVE mg/dL
Hgb urine dipstick: NEGATIVE
Ketones, ur: NEGATIVE mg/dL
Leukocytes,Ua: NEGATIVE
Nitrite: NEGATIVE
Protein, ur: NEGATIVE mg/dL
Specific Gravity, Urine: 1.012 (ref 1.005–1.030)
pH: 7 (ref 5.0–8.0)

## 2020-08-30 LAB — CBC
HCT: 47 % — ABNORMAL HIGH (ref 36.0–46.0)
Hemoglobin: 14 g/dL (ref 12.0–15.0)
MCH: 24.6 pg — ABNORMAL LOW (ref 26.0–34.0)
MCHC: 29.8 g/dL — ABNORMAL LOW (ref 30.0–36.0)
MCV: 82.5 fL (ref 80.0–100.0)
Platelets: 247 10*3/uL (ref 150–400)
RBC: 5.7 MIL/uL — ABNORMAL HIGH (ref 3.87–5.11)
RDW: 14 % (ref 11.5–15.5)
WBC: 6.6 10*3/uL (ref 4.0–10.5)
nRBC: 0 % (ref 0.0–0.2)

## 2020-08-30 LAB — COMPREHENSIVE METABOLIC PANEL
ALT: 25 U/L (ref 0–44)
AST: 22 U/L (ref 15–41)
Albumin: 4.3 g/dL (ref 3.5–5.0)
Alkaline Phosphatase: 78 U/L (ref 38–126)
Anion gap: 10 (ref 5–15)
BUN: 9 mg/dL (ref 6–20)
CO2: 29 mmol/L (ref 22–32)
Calcium: 9.4 mg/dL (ref 8.9–10.3)
Chloride: 103 mmol/L (ref 98–111)
Creatinine, Ser: 0.65 mg/dL (ref 0.44–1.00)
GFR, Estimated: >60 mL/min (ref >60)
Glucose, Bld: 115 mg/dL — ABNORMAL HIGH (ref 70–99)
Potassium: 4 mmol/L (ref 3.5–5.1)
Sodium: 142 mmol/L (ref 135–145)
Total Bilirubin: 0.7 mg/dL (ref 0.3–1.2)
Total Protein: 7.7 g/dL (ref 6.5–8.1)

## 2020-08-30 LAB — LIPASE, BLOOD: Lipase: 32 U/L (ref 11–51)

## 2020-08-30 MED ORDER — ALUM & MAG HYDROXIDE-SIMETH 200-200-20 MG/5ML PO SUSP
30.0000 mL | Freq: Once | ORAL | Status: AC
  Administered 2020-08-30: 30 mL via ORAL
  Filled 2020-08-30: qty 30

## 2020-08-30 MED ORDER — MORPHINE SULFATE (PF) 4 MG/ML IV SOLN
4.0000 mg | Freq: Once | INTRAVENOUS | Status: AC
  Administered 2020-08-30: 4 mg via INTRAVENOUS
  Filled 2020-08-30: qty 1

## 2020-08-30 MED ORDER — ONDANSETRON 4 MG PO TBDP: ORAL_TABLET | ORAL | 0 refills | Status: DC

## 2020-08-30 MED ORDER — HYDROCODONE-ACETAMINOPHEN 5-325 MG PO TABS: 1.0000 | ORAL_TABLET | Freq: Four times a day (QID) | ORAL | 0 refills | Status: DC | PRN

## 2020-08-30 MED ORDER — ESOMEPRAZOLE MAGNESIUM 40 MG PO CPDR: 40.0000 mg | DELAYED_RELEASE_CAPSULE | Freq: Every day | ORAL | 0 refills | Status: DC

## 2020-08-30 MED ORDER — LIDOCAINE VISCOUS HCL 2 % MT SOLN
15.0000 mL | Freq: Once | OROMUCOSAL | Status: AC
  Administered 2020-08-30: 15 mL via ORAL
  Filled 2020-08-30: qty 15

## 2020-08-30 MED ORDER — IOHEXOL 300 MG/ML  SOLN
100.0000 mL | Freq: Once | INTRAMUSCULAR | Status: AC | PRN
  Administered 2020-08-30: 100 mL via INTRAVENOUS

## 2020-08-30 MED ORDER — SODIUM CHLORIDE 0.9 % IV BOLUS
1000.0000 mL | Freq: Once | INTRAVENOUS | Status: AC
  Administered 2020-08-30: 1000 mL via INTRAVENOUS

## 2020-08-30 MED ORDER — ONDANSETRON HCL 4 MG/2ML IJ SOLN
4.0000 mg | Freq: Once | INTRAMUSCULAR | Status: AC
  Administered 2020-08-30: 4 mg via INTRAVENOUS
  Filled 2020-08-30: qty 2

## 2020-08-30 MED ORDER — FAMOTIDINE IN NACL 20-0.9 MG/50ML-% IV SOLN
20.0000 mg | Freq: Once | INTRAVENOUS | Status: AC
  Administered 2020-08-30: 20 mg via INTRAVENOUS
  Filled 2020-08-30: qty 50

## 2020-08-30 NOTE — Discharge Instructions (Signed)
Your evaluation today has overall been reassuring.  I suspect your symptoms are related to reflux and gastritis, and you have gallstones that could be contributing to symptoms as well.  Please use the information provided to make dietary changes that will help manage the symptoms.  I would like for you to begin taking Nexium, and acid blocker daily before your first meal of the day, you can use Zofran as needed for nausea and vomiting, Maalox as needed for breakthrough symptoms and Norco for severe pain.  This is a narcotic pain medication and can cause drowsiness.  Return to the ED for new or worsening symptoms.  Your CT scan did have some incidental findings of a possible cyst or mass on your left kidney and an area of fatty infiltration in your liver these should be followed up with an abdominal MRI, please discuss these findings with your primary care provider.

## 2020-08-30 NOTE — ED Provider Notes (Signed)
[facility] EMERGENCY DEPARTMENT Provider Note   CSN: [id number] Arrival date & time: 08/30/20  1001     History Chief Complaint  Patient presents with  . Abdominal Pain    Kelsey Cooke is a 50 y.o. female.  Kelsey Cooke is a 50 y.o. female with a history of hypertension, GERD, anxiety, who presents to the ED for evaluation of epigastric abdominal pain.  Pain started this morning and has been constant and severe.  She reports this is a burning pain sometimes, but also just a constant ache.  She reports feeling nauseated but no vomiting, did vomit once 2 days ago when she was having a similar episode of pain, although this was more short-lived.  She reports trying to take some Tums today without relief.  Does have a history of GERD, but this has been very well managed since she made some dietary changes, but she does admit eating food she typically would avoid over the holidays like citrus or tomato-based foods.  She reports that the pain does seem to radiate across her upper abdomen sometimes and sometimes to her back.  No lower abdominal pain.  Normal bowel movements.  No dysuria or frequency and no flank pain.  No prior abdominal surgeries.  Denies alcohol use or heavy NSAID use.        Past Medical History:  Diagnosis Date  . Allergic rhinitis   . Anxiety   . Breast tenderness in female 10/09/2015  . Cutaneous skin tags 03/27/2013   Had 2 minute skin tags on upper abdomen near breast removed  . Ear infection 11/13/2015  . Elevated BP 11/12/2014  . Fatigue 11/12/2014  . Fibroid 02/06/2016  . GERD (gastroesophageal reflux disease)   . Goiter   . Headache 01/08/2015  . Hemorrhoids 11/12/2014  . Hypertension   . Menorrhagia 11/08/2013  . Missed period 10/09/2015  . Pelvic pain in female 01/28/2016  . Rectocele 11/12/2014  . Sebaceous cyst of breast   . Sinus infection 11/08/2013    Patient Active Problem List   Diagnosis Date Noted  . Encounter for screening fecal occult blood  testing 03/07/2020  . Encounter for gynecological examination with Papanicolaou smear of cervix 03/07/2020  . Screening for thyroid disorder 03/09/2019  . Screening cholesterol level 03/09/2019  . Heartburn 03/09/2019  . Essential hypertension 03/08/2018  . IUD (intrauterine device) in place 03/08/2018  . Left lower quadrant abdominal tenderness without rebound tenderness 03/08/2018  . Screening for colorectal cancer 03/08/2018  . Encounter for well woman exam with routine gynecological exam 03/08/2018  . Neck pain 02/03/2017  . Cervical radiculopathy 02/03/2017  . Chronic low back pain with sciatica 02/03/2017  . Encounter for IUD insertion 12/24/2016  . Fibroid 02/06/2016  . Pelvic pain in female 01/28/2016  . Ear infection 11/13/2015  . Breast tenderness in female 10/09/2015  . Missed period 10/09/2015  . Hypertension 10/09/2015  . Esophageal reflux 07/21/2015  . Headache 01/08/2015  . Fatigue 11/12/2014  . Rectocele 11/12/2014  . Hemorrhoids 11/12/2014  . Elevated BP 11/12/2014  . Menorrhagia 11/08/2013  . Sinus infection 11/08/2013  . Depression 08/07/2013  . Sebaceous cyst of breast 03/27/2013  . Cutaneous skin tags 03/27/2013    Past Surgical History:  Procedure Laterality Date  . COLONOSCOPY N/A 08/06/2020   Procedure: COLONOSCOPY;  Surgeon: Daneil Dolin, MD;  Location: [facility];  Service: Endoscopy;  Laterality: N/A;  1:00  . goiter    . GUM SURGERY    . POLYPECTOMY  08/06/2020   Procedure: POLYPECTOMY;  Surgeon: Daneil Dolin, MD;  Location: [facility] SU[facility];  Service: Endoscopy;;  ileocecal valve  . VARICOSE VEIN SURGERY    . warts       OB History    Gravida  3   Para  3   Term      Preterm      AB      Living  3     SAB      IAB      Ectopic      Multiple      Live Births  3           Family History  Problem Relation Age of Onset  . Diabetes Mother   . Hypertension Mother   . Cancer Maternal Grandmother        breast  .  Hypertension Father   . Other Father        "weak heart"  . Hypertension Brother   . Cancer Sister        breast    Social History   Tobacco Use  . Smoking status: Former Smoker    Types: Cigarettes  . Smokeless tobacco: Never Used  Vaping Use  . Vaping Use: Never used  Substance Use Topics  . Alcohol use: No  . Drug use: No    Home Medications Prior to Admission medications   Medication Sig Start Date End Date Taking? Authorizing Provider  esomeprazole (NEXIUM) 40 MG capsule Take 1 capsule (40 mg total) by mouth daily. 08/30/20  Yes Jacqlyn Larsen, PA-C  ondansetron (ZOFRAN ODT) 4 MG disintegrating tablet 67m ODT q4 hours prn nausea/vomit 08/30/20  Yes FJacqlyn Larsen PA-C  acetaminophen (TYLENOL) 325 MG tablet Take 650 mg by mouth 2 (two) times daily as needed (for pain.).    [provider]  Diclofenac Sodium (PENNSAID) 2 % SOLN as needed.     [provider]  hydrochlorothiazide (MICROZIDE) 12.5 MG capsule Take 1 capsule (12.5 mg total) by mouth daily. 03/07/20   GEstill Dooms NP  HYDROcodone-acetaminophen (NORCO) 5-325 MG tablet Take 1 tablet by mouth every 6 (six) hours as needed. 08/30/20   FJacqlyn Larsen PA-C  levonorgestrel (MIRENA) 20 MCG/24HR IUD 1 each by Intrauterine route once.    [provider]  RESTASIS 0.05 % ophthalmic emulsion Place 1 drop into both eyes 2 (two) times daily as needed (dry/irritated eyes.).  01/09/20   [provider]  SUPREP BOWEL PREP KIT 17.5-3.13-1.6 GM/177ML SOLN Take 354 mLs by mouth as directed. 07/29/20   [provider]    Allergies    Cefzil [cefprozil]  Review of Systems   Review of Systems  Constitutional: Negative for chills and fever.  HENT: Negative.   Respiratory: Negative for cough and shortness of breath.   Cardiovascular: Negative for chest pain.  Gastrointestinal: Positive for abdominal pain and nausea. Negative for blood in stool, constipation, diarrhea and vomiting.   Genitourinary: Negative for dysuria.  Musculoskeletal: Negative for arthralgias and myalgias.  Skin: Negative for color change and rash.  Neurological: Negative for dizziness, syncope and light-headedness.  All other systems reviewed and are negative.   Physical Exam Updated Vital Signs BP (!) 192/96 (BP Location: Right Arm)   Pulse 87   Temp (!) 97.3 F (36.3 C) (Oral)   Resp 16   Ht 5' 5"  (1.651 m)   Wt 83.5 kg   SpO2 100%   BMI 30.62 kg/m  Physical Exam Vitals and nursing note reviewed.  Constitutional:      General: She is not in acute distress.    Appearance: She is well-developed and well-nourished. She is not diaphoretic.     Comments: Alert, appears uncomfortable but is in no acute distress.  HENT:     Head: Normocephalic and atraumatic.     Mouth/Throat:     Mouth: Oropharynx is clear and moist.  Eyes:     General:        Right eye: No discharge.        Left eye: No discharge.     Extraocular Movements: EOM normal.     Pupils: Pupils are equal, round, and reactive to light.  Cardiovascular:     Rate and Rhythm: Normal rate and regular rhythm.     Pulses: Intact distal pulses.     Heart sounds: Normal heart sounds.  Pulmonary:     Effort: Pulmonary effort is normal. No respiratory distress.     Breath sounds: Normal breath sounds. No wheezing or rales.  Abdominal:     General: Bowel sounds are normal. There is no distension.     Palpations: Abdomen is soft. There is no mass.     Tenderness: There is abdominal tenderness in the right upper quadrant, epigastric area and left upper quadrant. There is no guarding.     Comments: Abdomen is soft, nondistended, bowel sounds present throughout, there is tenderness across the upper abdomen most notably in the epigastric region, no lower abdominal tenderness, no guarding or peritoneal signs.  Musculoskeletal:        General: No deformity or edema.     Cervical back: Neck supple.  Skin:    General: Skin is warm and  dry.     Capillary Refill: Capillary refill takes less than 2 seconds.  Neurological:     Mental Status: She is alert.     Coordination: Coordination normal.     Comments: Speech is clear, able to follow commands Moves extremities without ataxia, coordination intact  Psychiatric:        Mood and Affect: Mood normal.        Behavior: Behavior normal.     ED Results / Procedures / Treatments   Labs (all labs ordered are listed, but only abnormal results are displayed) Labs Reviewed  COMPREHENSIVE METABOLIC PANEL - Abnormal; Notable for the following components:      Result Value   Glucose, Bld 115 (*)    All other components within normal limits  CBC - Abnormal; Notable for the following components:   RBC 5.70 (*)    HCT 47.0 (*)    MCH 24.6 (*)    MCHC 29.8 (*)    All other components within normal limits  URINALYSIS, ROUTINE W REFLEX MICROSCOPIC - Abnormal; Notable for the following components:   APPearance CLOUDY (*)    All other components within normal limits  URINE CULTURE  LIPASE, BLOOD    EKG None  Radiology CT ABDOMEN PELVIS W CONTRAST  Result Date: 08/30/2020 CLINICAL DATA:  Left-sided abdominal pain for 3 days EXAM: CT ABDOMEN AND PELVIS WITH CONTRAST TECHNIQUE: Multidetector CT imaging of the abdomen and pelvis was performed using the standard protocol following bolus administration of intravenous contrast. CONTRAST:  152m OMNIPAQUE IOHEXOL 300 MG/ML  SOLN COMPARISON:  08/01/2003 FINDINGS: Lower chest: No acute abnormality. Hepatobiliary: Geographic area of relatively decreased attenuation within the left hepatic lobe adjacent to the porta hepatis measuring approximately 2.5 x 2.5  cm (series 2, image 26). No abnormality is evident within this location on delayed phase imaging. Liver has an otherwise unremarkable appearance. Small gallstone is present within the gallbladder lumen. No pericholecystic inflammatory changes are evident by CT. Pancreas: Unremarkable. No  pancreatic ductal dilatation or surrounding inflammatory changes. Spleen: Normal in size without focal abnormality. Adrenals/Urinary Tract: Unremarkable adrenal glands. Tiny 1-2 mm stone within the lower pole of each kidney. Rounded partially exophytic 9 mm hyperdense lesion emanating posteriorly from the upper pole of the left kidney (series 2, image 24). No hydronephrosis. Urinary bladder is unremarkable for the degree of distension. Stomach/Bowel: Stomach is within normal limits. Appendix appears normal (series 2, image 61). No evidence of bowel wall thickening, distention, or inflammatory changes. Vascular/Lymphatic: Mild scattered aortoiliac atherosclerotic calcifications without aneurysm. No abdominopelvic lymphadenopathy. Reproductive: IUD present within the uterus. Rounded 4.4 cm mass emanating from the anterior uterine body/fundus suggestive of a fibroid. No adnexal abnormality. Other: No free fluid. No abdominopelvic fluid collection. No pneumoperitoneum. No abdominal wall hernia. Musculoskeletal: No acute or significant osseous findings. IMPRESSION: 1. No acute abdominopelvic findings. 2. Tiny bilateral nonobstructing nephrolithiasis. 3. Cholelithiasis without evidence of acute cholecystitis. 4. Rounded 9 mm hyperdense lesion emanating posteriorly from the upper pole of the left kidney, which may represent a hemorrhagic or proteinaceous cyst versus a solid renal mass. Non-emergent contrast-enhanced renal protocol MRI of the abdomen is recommended for further evaluation. 5. Geographic area of relatively decreased attenuation within the left hepatic lobe adjacent to the porta hepatis measuring approximately 2.5 x 2.5 cm. No abnormality is evident within this location on delayed phase imaging. Findings may represent an area of focal fatty infiltration versus an area of transient hepatic attenuation difference (THAD). This site could be more fully characterized on the previously recommended abdominal MRI. 6.  Uterine fundal fibroid measuring 4.4 cm. 7. Aortic atherosclerosis. (ICD10-I70.0). Electronically Signed   By: Davina Poke D.O.   On: 08/30/2020 12:22    Procedures Procedures (including critical care time)  Medications Ordered in ED Medications  ondansetron (ZOFRAN) injection 4 mg (4 mg Intravenous Given 08/30/20 1147)  morphine 4 MG/ML injection 4 mg (4 mg Intravenous Given 08/30/20 1147)  sodium chloride 0.9 % bolus 1,000 mL (0 mLs Intravenous Stopped 08/30/20 1250)  famotidine (PEPCID) IVPB 20 mg premix (0 mg Intravenous Stopped 08/30/20 1231)  alum & mag hydroxide-simeth (MAALOX/MYLANTA) 200-200-20 MG/5ML suspension 30 mL (30 mLs Oral Given 08/30/20 1152)    And  lidocaine (XYLOCAINE) 2 % viscous mouth solution 15 mL (15 mLs Oral Given 08/30/20 1152)  iohexol (OMNIPAQUE) 300 MG/ML solution 100 mL (100 mLs Intravenous Contrast Given 08/30/20 1150)    ED Course  I have reviewed the triage vital signs and the nursing notes.  Pertinent labs & imaging results that were available during my care of the patient were reviewed by me and considered in my medical decision making (see chart for details).    MDM Rules/Calculators/A&P                          Patient presents to the ED with complaints of abdominal pain. Patient nontoxic appearing, in no apparent distress, patient is very hypertensive on arrival, did not take her blood pressure medication this morning, vitals otherwise normal. On exam patient tender to palpation across the upper abdomen, most notably in the epigastric region, no peritoneal signs. Will evaluate with labs and CT.  My primary concern is for GERD or gastritis, also concern  for possible gallbladder disease, but given acute onset of pain in the setting of hypertension, this also raises concern for possible AAA, although I feel this is much less likely, no pulsatile mass, patient is currently alert and stable, differential also includes pancreatitis, obstruction, perforation, PUD,  appendicitis, diverticulitis.  Analgesics, anti-emetics, and fluids administered.   ER work-up reviewed:  CBC: No leukocytosis, normal hemoglobin CMP: Glucose of 115 but no other electrolyte derangements, normal renal and liver function Lipase: WNL UA: No evidence of infection  Imaging: CT with no acute abdominopelvic findings, tiny bilateral nonobstructing kidney stones noted, patient with cholelithiasis without evidence of acute cholecystitis.  There is a rounded 9 mm hyperdense lesion within the upper pole of the left kidney which may represent renal cyst versus renal mass.  There is also an area of decreased attenuation in the left hepatic lobe which could be an area of fatty infiltration or transient hepatic attenuation difference.  MRI is recommended for follow-up of both of these incidental findings, but they do not appear related to her symptoms today.  Discussed CT findings with patient.  On reevaluation she is feeling much better and now tolerating p.o. fluids.  Suspect likely GERD, could also be having some biliary colic.  On repeat abdominal exam patient remains without peritoneal signs, doubt cholecystitis, pancreatitis, diverticulitis, appendicitis, bowel obstruction/perforation, PID, or ectopic pregnancy. Patient tolerating PO in the emergency department. Will discharge home with supportive measures. I discussed results, treatment plan, need for PCP follow-up, and return precautions with the patient. Provided opportunity for questions, patient confirmed understanding and is in agreement with plan.    Final Clinical Impression(s) / ED Diagnoses Final diagnoses:  Pain of upper abdomen  Gallstones    Rx / DC Orders ED Discharge Orders         Ordered    esomeprazole (NEXIUM) 40 MG capsule  Daily        08/30/20 1420    ondansetron (ZOFRAN ODT) 4 MG disintegrating tablet        08/30/20 1420    HYDROcodone-acetaminophen (NORCO) 5-325 MG tablet  Every 6 hours PRN         08/30/20 1426           Jacqlyn Larsen, [place] 09/04/20 2349    Milton Ferguson, MD 09/08/20 1510

## 2020-08-30 NOTE — ED Triage Notes (Signed)
Pt c/o epigastric abdominal pain for the last hour.  Pt states she vomited 2 days ago.

## 2020-09-01 LAB — URINE CULTURE: Culture: NO GROWTH

## 2020-09-02 ENCOUNTER — Ambulatory Visit: Payer: BC Managed Care – PPO | Admitting: Family Medicine

## 2020-09-02 ENCOUNTER — Other Ambulatory Visit: Payer: Self-pay

## 2020-09-02 VITALS — BP 122/76 | HR 90 | Temp 97.7°F | Ht 64.0 in | Wt 189.8 lb

## 2020-09-02 DIAGNOSIS — N2889 Other specified disorders of kidney and ureter: Secondary | ICD-10-CM

## 2020-09-02 DIAGNOSIS — M79671 Pain in right foot: Secondary | ICD-10-CM

## 2020-09-02 DIAGNOSIS — R932 Abnormal findings on diagnostic imaging of liver and biliary tract: Secondary | ICD-10-CM

## 2020-09-02 DIAGNOSIS — N281 Cyst of kidney, acquired: Secondary | ICD-10-CM | POA: Diagnosis not present

## 2020-09-02 DIAGNOSIS — M79672 Pain in left foot: Secondary | ICD-10-CM

## 2020-09-02 NOTE — Progress Notes (Signed)
Patient ID: Kelsey Cooke, female    DOB: Dec 25, 1970, 50 y.o.   MRN: [id number]   Chief Complaint  Patient presents with  . ER follow up    Abdominal pain   Subjective:   Patient states the CT showed a spot on her liver and kidney and was told she needed to follow up for further testing or evaluation.  HPI   Pt went to ER on 08/30/20 for concern of abd pain and flare up of GERD. Started about 1 wk prior with these symptoms. Had vomiting and burnt her esophagus all the way up.  Had some citrus drinks and ketchup and had more pian. Having epigastric pain. Had pain an nausea meds in ER. Watching the diet and having soreness now under breasts. On CT has gallstones and had "spot on liver and kidney" needing follow up.   They gave her nexium 80m. Used to be on this long time ago. Went on diet and on [employer] And taking  Mylanta.  Recommending MRI with contrast to evaluating  renal cyt. 2.5x 2.5cm  Has an IUD and having some spotting, has had it a while. Seeing gyn.  Has some nausea still.   Had surgery on goiter removal of thyroid and removed 2 of the parathyorid.  Walking on feet and hurting, pain with getting up in morning. Had h/o plantar fasciitis. Pain shooting through the bottom.  No new injury to feet.  No new shoes.   Medical History JModestinehas a past medical history of Allergic rhinitis, Anxiety, Breast tenderness in female (10/09/2015), Cutaneous skin tags (03/27/2013), Ear infection (11/13/2015), Elevated BP (11/12/2014), Fatigue (11/12/2014), Fibroid (02/06/2016), GERD (gastroesophageal reflux disease), Goiter, Headache (01/08/2015), Hemorrhoids (11/12/2014), Hypertension, Menorrhagia (11/08/2013), Missed period (10/09/2015), Pelvic pain in female (01/28/2016), Rectocele (11/12/2014), Sebaceous cyst of breast, and Sinus infection (11/08/2013).   Outpatient Encounter Medications as of 09/02/2020  Medication Sig  . acetaminophen (TYLENOL) 325 MG tablet Take 650 mg by mouth 2  (two) times daily as needed (for pain.).  .Marland KitchenDiclofenac Sodium (PENNSAID) 2 % SOLN as needed.   .Marland Kitchenesomeprazole (NEXIUM) 40 MG capsule Take 1 capsule (40 mg total) by mouth daily.  . hydrochlorothiazide (MICROZIDE) 12.5 MG capsule Take 1 capsule (12.5 mg total) by mouth daily.  .Marland KitchenHYDROcodone-acetaminophen (NORCO) 5-325 MG tablet Take 1 tablet by mouth every 6 (six) hours as needed.  .Marland Kitchenlevonorgestrel (MIRENA) 20 MCG/24HR IUD 1 each by Intrauterine route once.  . ondansetron (ZOFRAN ODT) 4 MG disintegrating tablet 469mODT q4 hours prn nausea/vomit  . RESTASIS 0.05 % ophthalmic emulsion Place 1 drop into both eyes 2 (two) times daily as needed (dry/irritated eyes.).   . Marland KitchenUPREP BOWEL PREP KIT 17.5-3.13-1.6 GM/177ML SOLN Take 354 mLs by mouth as directed.   No facility-administered encounter medications on file as of 09/02/2020.     Review of Systems  Constitutional: Negative for chills and fever.  HENT: Negative for congestion, rhinorrhea and sore throat.   Respiratory: Negative for cough, shortness of breath and wheezing.   Cardiovascular: Negative for chest pain and leg swelling.  Gastrointestinal: Positive for abdominal pain and nausea. Negative for diarrhea and vomiting.       +reflux  Genitourinary: Negative for dysuria and frequency.  Musculoskeletal: Negative for arthralgias and back pain.  Skin: Negative for rash.  Neurological: Negative for dizziness, weakness and headaches.     Vitals BP 122/76   Pulse 90   Temp 97.7 F (36.5 C) (Oral)   Ht _0  (  1.626 m)   Wt 189 lb 12.8 oz (86.1 kg)   SpO2 98%   BMI 32.58 kg/m   Objective:   Physical Exam Vitals and nursing note reviewed.  Constitutional:      Appearance: Normal appearance.  HENT:     Head: Normocephalic and atraumatic.     Nose: Nose normal.     Mouth/Throat:     Mouth: Mucous membranes are moist.     Pharynx: Oropharynx is clear.  Eyes:     Extraocular Movements: Extraocular movements intact.      Conjunctiva/sclera: Conjunctivae normal.     Pupils: Pupils are equal, round, and reactive to light.  Cardiovascular:     Rate and Rhythm: Normal rate and regular rhythm.     Pulses: Normal pulses.     Heart sounds: Normal heart sounds.  Pulmonary:     Effort: Pulmonary effort is normal.     Breath sounds: Normal breath sounds. No wheezing, rhonchi or rales.  Abdominal:     General: Abdomen is flat. Bowel sounds are normal. There is no distension.     Palpations: Abdomen is soft. There is no mass.     Tenderness: There is no abdominal tenderness. There is no guarding or rebound.     Hernia: No hernia is present.  Musculoskeletal:        General: Normal range of motion.     Right lower leg: No edema.     Left lower leg: No edema.  Skin:    General: Skin is warm and dry.     Findings: No lesion or rash.  Neurological:     General: No focal deficit present.     Mental Status: She is alert and oriented to person, place, and time.  Psychiatric:        Mood and Affect: Mood normal.        Behavior: Behavior normal.      Assessment and Plan   1. Abnormal CT of liver - MR Abdomen W Wo Contrast  2. Renal mass, left - MR Abdomen W Wo Contrast  3. Bilateral foot pain  4. Acquired cyst of kidney - MR Abdomen W Wo Contrast   abd pain/gerd have improved- Cont with nexium and mylanta.  Mri abd with contrast ordered to f/u on incidental findings of liver abnormality on ct and renal mass/cyst on left.  Bilateral foot pain- foot stretches, and roll on frozen bottle of water, and naproxen bid with food.   F/u after imaging.

## 2020-09-04 ENCOUNTER — Ambulatory Visit (HOSPITAL_COMMUNITY)
Admission: RE | Admit: 2020-09-04 | Discharge: 2020-09-04 | Disposition: A | Discharge status: Home / Self Care | Payer: BC Managed Care – PPO | Source: Ambulatory Visit | Attending: Adult Health | Admitting: Adult Health

## 2020-09-04 ENCOUNTER — Other Ambulatory Visit: Payer: Self-pay

## 2020-09-04 DIAGNOSIS — Z1231 Encounter for screening mammogram for malignant neoplasm of breast: Secondary | ICD-10-CM | POA: Insufficient documentation

## 2020-09-24 ENCOUNTER — Ambulatory Visit (HOSPITAL_COMMUNITY)
Admission: RE | Admit: 2020-09-24 | Discharge: 2020-09-24 | Disposition: A | Discharge status: Home / Self Care | Payer: BC Managed Care – PPO | Source: Ambulatory Visit | Attending: Family Medicine | Admitting: Family Medicine

## 2020-09-24 ENCOUNTER — Other Ambulatory Visit: Payer: Self-pay

## 2020-09-24 DIAGNOSIS — K8020 Calculus of gallbladder without cholecystitis without obstruction: Secondary | ICD-10-CM | POA: Diagnosis not present

## 2020-09-24 DIAGNOSIS — R932 Abnormal findings on diagnostic imaging of liver and biliary tract: Secondary | ICD-10-CM | POA: Diagnosis not present

## 2020-09-24 DIAGNOSIS — N2889 Other specified disorders of kidney and ureter: Secondary | ICD-10-CM | POA: Diagnosis not present

## 2020-09-24 DIAGNOSIS — N281 Cyst of kidney, acquired: Secondary | ICD-10-CM | POA: Diagnosis not present

## 2020-09-24 DIAGNOSIS — K7689 Other specified diseases of liver: Secondary | ICD-10-CM | POA: Diagnosis not present

## 2020-09-24 MED ORDER — GADOBUTROL 1 MMOL/ML IV SOLN
9.0000 mL | Freq: Once | INTRAVENOUS | Status: AC | PRN
  Administered 2020-09-24: 9 mL via INTRAVENOUS

## 2020-09-29 ENCOUNTER — Telehealth: Payer: Self-pay

## 2020-09-29 DIAGNOSIS — R109 Unspecified abdominal pain: Secondary | ICD-10-CM

## 2020-09-29 NOTE — Telephone Encounter (Signed)
I would recommend urgent referral to [facility] gastroenterology due to right upper quadrant pain abnormal CT, MR  Please talk with Anderson Malta see how she is doing currently what type of symptoms is she experiencing, (we need to delineate if there is any worrisome issues going on that need immediate input)  Thanks

## 2020-09-29 NOTE — Telephone Encounter (Signed)
Pt contacted. Urgent referral placed. Pt states she is having pain in back right side. Feeling of being full and nauseous. Pt states she feels like if she could vomit she will feel better.

## 2020-09-29 NOTE — Telephone Encounter (Signed)
Kelsey Cooke was seen two weeks ago for gallbladder issues and she is more pain went to the ER this past weekend and not improving and wanting to see of she can get a referral to specialist.  Pt call back [phone]

## 2020-10-02 ENCOUNTER — Encounter: Payer: Self-pay | Admitting: General Surgery

## 2020-10-02 ENCOUNTER — Other Ambulatory Visit: Payer: Self-pay

## 2020-10-02 ENCOUNTER — Ambulatory Visit: Payer: BC Managed Care – PPO | Admitting: General Surgery

## 2020-10-02 VITALS — BP 135/83 | HR 71 | Temp 97.6°F | Resp 14 | Ht 64.0 in | Wt 195.0 lb

## 2020-10-02 DIAGNOSIS — K8020 Calculus of gallbladder without cholecystitis without obstruction: Secondary | ICD-10-CM | POA: Insufficient documentation

## 2020-10-02 NOTE — Patient Instructions (Signed)
Cholelithiasis  Cholelithiasis is a disease in which gallstones form in the gallbladder. The gallbladder is an organ that stores bile. Bile is a fluid that helps to digest fats. Gallstones begin as small crystals and can slowly grow into stones. They may cause no symptoms until they block the gallbladder duct, or cystic duct, when the gallbladder tightens (contracts) after food is eaten. This can cause pain and is known as a gallbladder attack, or biliary colic. There are two main types of gallstones:  Cholesterol stones. These are the most common type of gallstone. These stones are made of hardened cholesterol and are usually yellow-green in color. Cholesterol is a fat-like substance that is made in the liver.  Pigment stones. These are dark in color and are made of a red-yellow substance, called bilirubin,that forms when hemoglobin from red blood cells breaks down. What are the causes? This condition may be caused by an imbalance in the different parts that make bile. This can happen if the bile:  Has too much bilirubin. This can happen in certain blood diseases, such as sickle cell anemia.  Has too much cholesterol.  Does not have enough bile salts. These salts help the body absorb and digest fats. In some cases, this condition can also be caused by the gallbladder not emptying completely or often enough. This is common during pregnancy. What increases the risk? The following factors may make you more likely to develop this condition:  Being female.  Having multiple pregnancies. Health care providers sometimes advise removing diseased gallbladders before future pregnancies.  Eating a diet that is heavy in fried foods, fat, and refined carbohydrates, such as white bread and white rice.  Being obese.  Being older than age 40.  Using medicines that contain female hormones (estrogen) for a long time.  Losing weight quickly.  Having a family history of gallstones.  Having certain  medical problems, such as: ? Diabetes mellitus. ? Cystic fibrosis. ? Crohn's disease. ? Cirrhosis or other long-term (chronic) liver disease. ? Certain blood diseases, such as sickle cell anemia or leukemia. What are the signs or symptoms? In many cases, having gallstones causes no symptoms. When you have gallstones but do not have symptoms, you have silent gallstones. If a gallstone blocks your bile duct, it can cause a gallbladder attack. The main symptom of a gallbladder attack is sudden pain in the upper right part of the abdomen. The pain:  Usually comes at night or after eating.  Can last for one hour or more.  Can spread to your right shoulder, back, or chest.  Can feel like indigestion. This is discomfort, burning, or fullness in your upper abdomen. If the bile duct is blocked for more than a few hours, it can cause an infection or inflammation of your gallbladder (cholecystitis), liver, or pancreas. This can cause:  Nausea or vomiting.  Bloating.  Pain in your abdomen that lasts for 5 hours or longer.  Tenderness in your upper abdomen, often in the upper right section and under your rib cage.  Fever or chills.  Skin or the white parts of your eyes turning yellow (jaundice). This usually happens when a stone has blocked bile from passing through the common bile duct.  Dark urine or light-colored stools. How is this diagnosed? This condition may be diagnosed based on:  A physical exam.  Your medical history.  Ultrasound.  CT scan.  MRI. You may also have other tests, including:  Blood tests to check for signs of an   infection or inflammation.  Cholescintigraphy, or HIDA scan. This is a scan of your gallbladder and bile ducts (biliary system) using non-harmful radioactive material and special cameras that can see the radioactive material.  Endoscopic retrograde cholangiopancreatogram. This involves inserting a small tube with a camera on the end (endoscope)  through your mouth to look at bile ducts and check for blockages. How is this treated? Treatment for this condition depends on the severity of the condition. Silent gallstones do not need treatment. Treatment may be needed if a blockage causes a gallbladder attack or other symptoms. Treatment may include:  Home care, if symptoms are not severe. ? During a simple gallbladder attack, stop eating and drinking for 12-24 hours (except for water and clear liquids). This helps to "cool down" your gallbladder. After 1 or 2 days, you can start to eat a diet of simple or clear foods, such as broths and crackers. ? You may also need medicines for pain or nausea or both. ? If you have cholecystitis and an infection, you will need antibiotics.  A hospital stay, if needed for pain control or for cholecystitis with severe infection.  Cholecystectomy, or surgery to remove your gallbladder. This is the most common treatment if all other treatments have not worked.  Medicines to break up gallstones. These are most effective at treating small gallstones. Medicines may be used for up to 6-12 months.  Endoscopic retrograde cholangiopancreatogram. A small basket can be attached to the endoscope and used to capture and remove gallstones, mainly those that are in the common bile duct. Follow these instructions at home: Medicines  Take over-the-counter and prescription medicines only as told by your health care provider.  If you were prescribed an antibiotic medicine, take it as told by your health care provider. Do not stop taking the antibiotic even if you start to feel better.  Ask your health care provider if the medicine prescribed to you requires you to avoid driving or using machinery. Eating and drinking  Drink enough fluid to keep your urine pale yellow. This is important during a gallbladder attack. Water and clear liquids are preferred.  Follow a healthy diet. This includes: ? Reducing fatty foods,  such as fried food and foods high in cholesterol. ? Reducing refined carbohydrates, such as white bread and white rice. ? Eating more fiber. Aim for foods such as almonds, fruit, and beans. Alcohol use  If you drink alcohol: ? Limit how much you use to:  0-1 drink a day for nonpregnant women.  0-2 drinks a day for men. ? Be aware of how much alcohol is in your drink. In the [place] one drink equals one 12 oz bottle of beer (355 mL), one 5 oz glass of wine (148 mL), or one 1 oz glass of hard liquor (44 mL). General instructions  Do not use any products that contain nicotine or tobacco, such as cigarettes, e-cigarettes, and chewing tobacco. If you need help quitting, ask your health care provider.  Maintain a healthy weight.  Keep all follow-up visits as told by your health care provider. These may include consultations with a surgeon or specialist. This is important. Where to find more information  National Institute of Diabetes and Digestive and Kidney Diseases: www.niddk.nih.gov Contact a health care provider if:  You think you have had a gallbladder attack.  You have been diagnosed with silent gallstones and you develop pain in your abdomen or indigestion.  You begin to have attacks more often.  You have   dark urine or light-colored stools. Get help right away if:  You have pain from a gallbladder attack that lasts for more than 2 hours.  You have pain in your abdomen that lasts for more than 5 hours or is getting worse.  You have a fever or chills.  You have nausea and vomiting that do not go away.  You develop jaundice. Summary  Cholelithiasis is a disease in which gallstones form in the gallbladder.  This condition may be caused by an imbalance in the different parts that make bile. This can happen if your bile has too much bilirubin or cholesterol, or does not have enough bile salts.  Treatment for gallstones depends on the severity of the condition. Silent  gallstones do not need treatment.  If gallstones cause a gallbladder attack or other symptoms, treatment usually involves not eating or drinking anything. Treatment may also include pain medicines and antibiotics, and it sometimes includes a hospital stay.  Surgery to remove the gallbladder is common if all other treatments have not worked. This information is not intended to replace advice given to you by your health care provider. Make sure you discuss any questions you have with your health care provider. Document Revised: 07/09/2019 Document Reviewed: 07/09/2019 Else[employer] Patient Education  2021 [employer]   Minimally Invasive Cholecystectomy Minimally invasive cholecystectomy is surgery to remove the gallbladder. The gallbladder is a pear-shaped organ that lies beneath the liver on the right side of the body. The gallbladder stores bile, which is a fluid that helps the body digest fats. Cholecystectomy is often done to treat inflammation of the gallbladder (cholecystitis). This condition is usually caused by a buildup of gallstones (cholelithiasis) in the gallbladder. Gallstones can block the flow of bile, which can result in inflammation and pain. In severe cases, emergency surgery may be required. This procedure is done though small incisions in the abdomen, instead of one large incision. It is also called laparoscopic surgery. A thin scope with a camera (laparoscope) is inserted through one incision. Then surgical instruments are inserted through the other incisions. In some cases, a minimally invasive surgery may need to be changed to a surgery that is done through a larger incision. This is called open surgery. Tell a health care provider about:  Any allergies you have.  All medicines you are taking, including vitamins, herbs, eye drops, creams, and over-the-counter medicines.  Any problems you or family members have had with anesthetic medicines.  Any blood disorders you  have.  Any surgeries you have had.  Any medical conditions you have.  Whether you are pregnant or may be pregnant. What are the risks? Generally, this is a safe procedure. However, problems may occur, including:  Infection.  Bleeding.  Allergic reactions to medicines.  Damage to nearby structures or organs.  A stone remaining in the common bile duct. The common bile duct carries bile from the gallbladder into the small intestine.  A bile leak from the cyst duct that is clipped when your gallbladder is removed. Medicines Ask your health care provider about:  Changing or stopping your regular medicines. This is especially important if you are taking diabetes medicines or blood thinners.  Taking medicines such as aspirin and ibuprofen. These medicines can thin your blood. Do not take these medicines unless your health care provider tells you to take them.  Taking over-the-counter medicines, vitamins, herbs, and supplements. General instructions  Let your health care provider know if you develop a cold or an infection before   surgery.  Plan to have someone take you home from the hospital or clinic.  If you will be going home right after the procedure, plan to have someone with you for 24 hours.  Ask your health care provider: ? How your surgery site will be marked. ? What steps will be taken to help prevent infection. These may include:  Removing hair at the surgery site.  Washing skin with a germ-killing soap.  Taking antibiotic medicine. What happens during the procedure?  An IV will be inserted into one of your veins.  You will be given one or both of the following: ? A medicine to help you relax (sedative). ? A medicine to make you fall asleep (general anesthetic).  A breathing tube will be placed in your mouth.  Your surgeon will make several small incisions in your abdomen.  The laparoscope will be inserted through one of the small incisions. The camera on  the laparoscope will send images to a monitor in the operating room. This lets your surgeon see inside your abdomen.  A gas will be pumped into your abdomen. This will expand your abdomen to give the surgeon more room to perform the surgery.  Other tools that are needed for the procedure will be inserted through the other incisions. The gallbladder will be removed through one of the incisions.  Your common bile duct may be examined. If stones are found in the common bile duct, they may be removed.  After your gallbladder has been removed, the incisions will be closed with stitches (sutures), staples, or skin glue.  Your incisions may be covered with a bandage (dressing). The procedure may vary among health care providers and hospitals.   What happens after the procedure?  Your blood pressure, heart rate, breathing rate, and blood oxygen level will be monitored until you leave the hospital or clinic.  You will be given medicines as needed to control your pain.  If you were given a sedative during the procedure, it can affect you for several hours. Do not drive or operate machinery until your health care provider says that it is safe. Summary  Minimally invasive cholecystectomy, also called laparoscopic cholecystectomy, is surgery to remove the gallbladder using small incisions.  Tell your health care provider about all the medical conditions you have and all the medicines you are taking for those conditions.  Before the procedure, follow instructions about eating or drinking restrictions and changing or stopping medicines.  If you were given a sedative during the procedure, it can affect you for several hours. Do not drive or operate machinery until your health care provider says that it is safe. This information is not intended to replace advice given to you by your health care provider. Make sure you discuss any questions you have with your health care provider. Document Revised:  05/21/2019 Document Reviewed: 05/21/2019 [employer] Patient Education  2021 [employer]   

## 2020-10-02 NOTE — Progress Notes (Signed)
[facility] History and Physical  Reason for Referral: Gallstones  Referring Physician:  Self   Chief Complaint    New Patient (Initial Visit)      Kelsey Cooke is a 51 y.o. female.  HPI: Kelsey Cooke is a 50 yo who complaints of multiple episodes or RUQ into her right flank pain that is achy in nature. She says she has some associated nausea and minor GERD but that whenever she eats she feels a digging pain in her RUQ.  She was seen in the ED a few times and had a  CT that demonstrated stones and a area in the liver that required further evaluation. She had a MRI done with her PCP and was noted to have perfusion anomaly in the area of the gallbladder fossa corresponding to the area on the CT and stones in her gallbladder. She also had a cyst on her left kidney.    She comes in because she is continuing to have pain and issues.   Past Medical History:  Diagnosis Date  . Allergic rhinitis   . Anxiety   . Breast tenderness in female 10/09/2015  . Cutaneous skin tags 03/27/2013   Had 2 minute skin tags on upper abdomen near breast removed  . Ear infection 11/13/2015  . Elevated BP 11/12/2014  . Fatigue 11/12/2014  . Fibroid 02/06/2016  . GERD (gastroesophageal reflux disease)   . Goiter   . Headache 01/08/2015  . Hemorrhoids 11/12/2014  . Hypertension   . Menorrhagia 11/08/2013  . Missed period 10/09/2015  . Pelvic pain in female 01/28/2016  . Rectocele 11/12/2014  . Sebaceous cyst of breast   . Sinus infection 11/08/2013    Past Surgical History:  Procedure Laterality Date  . COLONOSCOPY N/A 08/06/2020   Procedure: COLONOSCOPY;  Surgeon: Daneil Dolin, MD;  Location: [facility];  Service: Endoscopy;  Laterality: N/A;  1:00  . goiter    . GUM SURGERY    . POLYPECTOMY  08/06/2020   Procedure: POLYPECTOMY;  Surgeon: Daneil Dolin, MD;  Location: [facility];  Service: Endoscopy;;  ileocecal valve  . VARICOSE VEIN SURGERY    . warts      Family History   Problem Relation Age of Onset  . Diabetes Mother   . Hypertension Mother   . Cancer Maternal Grandmother        breast  . Hypertension Father   . Other Father        "weak heart"  . Hypertension Brother   . Cancer Sister        breast    Social History   Tobacco Use  . Smoking status: Former Smoker    Types: Cigarettes  . Smokeless tobacco: Never Used  Vaping Use  . Vaping Use: Never used  Substance Use Topics  . Alcohol use: No  . Drug use: No    Medications: I have reviewed the patient's current medications. Allergies as of 10/02/2020      Reactions   Cefzil [cefprozil] Other (See Comments)   Caused knot      Medication List       Accurate as of October 02, 2020  1:30 PM. If you have any questions, ask your nurse or doctor.        STOP taking these medications   Suprep Bowel Prep Kit 17.5-3.13-1.6 GM/177ML Soln Generic drug: Na Sulfate-K Sulfate-Mg Sulf Stopped by: Virl Cagey, MD     TAKE these medications  acetaminophen 325 MG tablet Commonly known as: TYLENOL Take 650 mg by mouth 2 (two) times daily as needed (for pain.).   Diclofenac Sodium 2 % Soln as needed.   esomeprazole 40 MG capsule Commonly known as: NEXIUM Take 1 capsule (40 mg total) by mouth daily.   hydrochlorothiazide 12.5 MG capsule Commonly known as: MICROZIDE Take 1 capsule (12.5 mg total) by mouth daily.   HYDROcodone-acetaminophen 5-325 MG tablet Commonly known as: Norco Take 1 tablet by mouth every 6 (six) hours as needed.   levonorgestrel 20 MCG/24HR IUD Commonly known as: MIRENA 1 each by Intrauterine route once.   ondansetron 4 MG disintegrating tablet Commonly known as: Zofran ODT 2m ODT q4 hours prn nausea/vomit   Restasis 0.05 % ophthalmic emulsion Generic drug: cycloSPORINE Place 1 drop into both eyes 2 (two) times daily as needed (dry/irritated eyes.).        ROS:  A comprehensive review of systems was negative except for: Gastrointestinal:  positive for abdominal pain, nausea and reflux symptoms Musculoskeletal: positive for back pain, neck pain and joint pain  Blood pressure 135/83, pulse 71, temperature 97.6 F (36.4 C), temperature source Other (Comment), resp. rate 14, height 5' 4"  (1.626 m), weight 195 lb (88.5 kg), SpO2 98 %. Physical Exam Vitals reviewed.  Constitutional:      Appearance: Normal appearance.  HENT:     Head: Normocephalic.     Nose: Nose normal.     Mouth/Throat:     Mouth: Mucous membranes are moist.  Eyes:     Extraocular Movements: Extraocular movements intact.  Cardiovascular:     Rate and Rhythm: Normal rate and regular rhythm.  Pulmonary:     Effort: Pulmonary effort is normal.     Breath sounds: Normal breath sounds.  Abdominal:     General: There is no distension.     Palpations: Abdomen is soft.     Tenderness: There is abdominal tenderness in the right upper quadrant and epigastric area.  Musculoskeletal:        General: No swelling.     Cervical back: Normal range of motion.  Skin:    General: Skin is warm.  Neurological:     General: No focal deficit present.     Mental Status: She is alert and oriented to person, place, and time.  Psychiatric:        Mood and Affect: Mood normal.        Behavior: Behavior normal.        Thought Content: Thought content normal.        Judgment: Judgment normal.     Results: CLINICAL DATA:  Left-sided abdominal pain for 3 days  EXAM: CT ABDOMEN AND PELVIS WITH CONTRAST  TECHNIQUE: Multidetector CT imaging of the abdomen and pelvis was performed using the standard protocol following bolus administration of intravenous contrast.  CONTRAST:  1027mOMNIPAQUE IOHEXOL 300 MG/ML  SOLN  COMPARISON:  08/01/2003  FINDINGS: Lower chest: No acute abnormality.  Hepatobiliary: Geographic area of relatively decreased attenuation within the left hepatic lobe adjacent to the porta hepatis measuring approximately 2.5 x 2.5 cm (series 2,  image 26). No abnormality is evident within this location on delayed phase imaging. Liver has an otherwise unremarkable appearance. Small gallstone is present within the gallbladder lumen. No pericholecystic inflammatory changes are evident by CT.  Pancreas: Unremarkable. No pancreatic ductal dilatation or surrounding inflammatory changes.  Spleen: Normal in size without focal abnormality.  Adrenals/Urinary Tract: Unremarkable adrenal glands. Tiny 1-2 mm stone  within the lower pole of each kidney. Rounded partially exophytic 9 mm hyperdense lesion emanating posteriorly from the upper pole of the left kidney (series 2, image 24). No hydronephrosis. Urinary bladder is unremarkable for the degree of distension.  Stomach/Bowel: Stomach is within normal limits. Appendix appears normal (series 2, image 61). No evidence of bowel wall thickening, distention, or inflammatory changes.  Vascular/Lymphatic: Mild scattered aortoiliac atherosclerotic calcifications without aneurysm. No abdominopelvic lymphadenopathy.  Reproductive: IUD present within the uterus. Rounded 4.4 cm mass emanating from the anterior uterine body/fundus suggestive of a fibroid. No adnexal abnormality.  Other: No free fluid. No abdominopelvic fluid collection. No pneumoperitoneum. No abdominal wall hernia.  Musculoskeletal: No acute or significant osseous findings.  IMPRESSION: 1. No acute abdominopelvic findings. 2. Tiny bilateral nonobstructing nephrolithiasis. 3. Cholelithiasis without evidence of acute cholecystitis. 4. Rounded 9 mm hyperdense lesion emanating posteriorly from the upper pole of the left kidney, which may represent a hemorrhagic or proteinaceous cyst versus a solid renal mass. Non-emergent contrast-enhanced renal protocol MRI of the abdomen is recommended for further evaluation. 5. Geographic area of relatively decreased attenuation within the left hepatic lobe adjacent to the porta  hepatis measuring approximately 2.5 x 2.5 cm. No abnormality is evident within this location on delayed phase imaging. Findings may represent an area of focal fatty infiltration versus an area of transient hepatic attenuation difference (THAD). This site could be more fully characterized on the previously recommended abdominal MRI. 6. Uterine fundal fibroid measuring 4.4 cm. 7. Aortic atherosclerosis. (ICD10-I70.0).   Electronically Signed   By: Davina Poke D.O.   On: 08/30/2020 12:22  CLINICAL DATA:  Left renal and liver abnormalities on recent CT scan.  EXAM: MRI ABDOMEN WITHOUT AND WITH CONTRAST  TECHNIQUE: Multiplanar multisequence MR imaging of the abdomen was performed both before and after the administration of intravenous contrast.  CONTRAST:  2m GADAVIST GADOBUTROL 1 MMOL/ML IV SOLN  COMPARISON:  CT scan 08/30/2020  FINDINGS: Lower chest: Unremarkable.  Hepatobiliary: 3.0 x 2.2 cm oval region of decreased perfusion identified in posterior segment IV, along the gallbladder fossa. This corresponds to the area of decreased attenuation identified on previous CT scan. This area is isointense to background liver parenchyma on all precontrast imaging sequences, does not show loss of signal intensity on out of phase were fat suppressed T1 imaging, and demonstrates no restricted diffusion. The only postcontrast sequence on which it is visible is the arterial phase early sequence with this region showing signal intensity of background liver parenchyma on all later post-contrast phases. Liver parenchyma otherwise unremarkable. Numerous tiny gallstones evident No intrahepatic or extrahepatic biliary dilation.  Pancreas: No focal mass lesion. No dilatation of the main duct. No intraparenchymal cyst. No peripancreatic edema.  Spleen:  No splenomegaly. No focal mass lesion.  Adrenals/Urinary Tract: No adrenal nodule or mass. Right kidney unremarkable. Tiny  subcapsular lesion posterior upper pole left kidney shows high signal intensity on precontrast T1 imaging (axial image 50/series 12) with no enhancement after IV contrast administration. Imaging features compatible with benign Bosniak II cyst. Similar 6 mm T1 hyperattenuating lesion noted in the extreme upper pole left kidney, also with no demonstrable enhancement after IV contrast administration. 4 mm cortical lesion towards the lower pole left kidney is too small to characterize but likely a benign cyst.  Stomach/Bowel: Stomach is unremarkable. No gastric wall thickening. No evidence of outlet obstruction. Duodenum is normally positioned as is the ligament of Treitz. No small bowel or colonic dilatation within the visualized abdomen.  Vascular/Lymphatic: No abdominal aortic aneurysm There is no gastrohepatic or hepatoduodenal ligament lymphadenopathy. No retroperitoneal or mesenteric lymphadenopathy.  Other:  No intraperitoneal free fluid.  Musculoskeletal: No focal suspicious marrow enhancement within the visualized bony anatomy.  IMPRESSION: 1. 3.0 x 2.2 cm oval region of decreased perfusion noted in posterior segment IV, along the gallbladder fossa corresponding to the abnormality on the recent CT scan. This finding is visible only on the arterial phase postcontrast imaging, being isointense to background liver parenchyma on all other pulse sequences including delayed postcontrast imaging. No restricted diffusion. This is most likely related to a focal area of perfusion anomaly. Follow-up MR and 3-6 months could be used to ensure stability. 2. Tiny Bosniak II cysts in the left kidney, corresponding to the 9 mm hyperdense lesion identified on recent CT scan. Additional very tiny lesions in the left kidney are technically too small to characterize but most likely benign. 3. Cholelithiasis.   Electronically Signed   By: Misty Stanley M.D.   On: 09/24/2020  09:04  Assessment & Plan:  Kelsey Cooke is a 50 y.o. female with gallstones that sound symptomatic as well as a kidney cyst and a area on the liver that is likely a perfusion anomaly in the gallbladder fossa. She is ultimately going to need to get her gallbladder out but will also need follow up MRI per the report.   -PCP can get follow up MRI -Will touch base with radiology about liver area again to ensure nothing else concerning -Will touch base with Urology about the cyst on the kidney PLAN: I counseled the patient about the indication, risks and benefits of laparoscopic cholecystectomy.  She understands there is a very small chance for bleeding, infection, injury to normal structures (including common bile duct), conversion to open surgery, persistent symptoms, evolution of postcholecystectomy diarrhea, need for secondary interventions, anesthesia reaction, cardiopulmonary issues and other risks not specifically detailed here. I described the expected recovery, the plan for follow-up and the restrictions during the recovery phase.  All questions were answered.  Discussed preop COVID testing and discussed possibility of cases being canceled in the upcoming weeks due to hospital shortages/ [place].   All questions were answered to the satisfaction of the patient.   Virl Cagey 10/02/2020, 1:30 PM

## 2020-10-07 ENCOUNTER — Telehealth: Payer: Self-pay | Admitting: Family Medicine

## 2020-10-07 NOTE — Telephone Encounter (Signed)
-----   Message from Erven Colla, DO sent at 09/25/2020  1:17 PM EST ----- Repeat MRI abdomen for liver and kidney recheck.  In 56mo, around 02/27/21.

## 2020-10-13 NOTE — H&P (Signed)
[facility] History and Physical  Reason for Referral: Gallstones  Referring Physician:  Self      Chief Complaint    New Patient (Initial Visit)      Kelsey Cooke is a 50 y.o. female.  HPI: Kelsey Cooke is a 50 yo who complaints of multiple episodes or RUQ into her right flank pain that is achy in nature. She says she has some associated nausea and minor GERD but that whenever she eats she feels a digging pain in her RUQ.  She was seen in the ED a few times and had a  CT that demonstrated stones and a area in the liver that required further evaluation. She had a MRI done with her PCP and was noted to have perfusion anomaly in the area of the gallbladder fossa corresponding to the area on the CT and stones in her gallbladder. She also had a cyst on her left kidney.    She comes in because she is continuing to have pain and issues.       Past Medical History:  Diagnosis Date  . Allergic rhinitis   . Anxiety   . Breast tenderness in female 10/09/2015  . Cutaneous skin tags 03/27/2013   Had 2 minute skin tags on upper abdomen near breast removed  . Ear infection 11/13/2015  . Elevated BP 11/12/2014  . Fatigue 11/12/2014  . Fibroid 02/06/2016  . GERD (gastroesophageal reflux disease)   . Goiter   . Headache 01/08/2015  . Hemorrhoids 11/12/2014  . Hypertension   . Menorrhagia 11/08/2013  . Missed period 10/09/2015  . Pelvic pain in female 01/28/2016  . Rectocele 11/12/2014  . Sebaceous cyst of breast   . Sinus infection 11/08/2013         Past Surgical History:  Procedure Laterality Date  . COLONOSCOPY N/A 08/06/2020   Procedure: COLONOSCOPY;  Surgeon: Daneil Dolin, MD;  Location: [facility];  Service: Endoscopy;  Laterality: N/A;  1:00  . goiter    . GUM SURGERY    . POLYPECTOMY  08/06/2020   Procedure: POLYPECTOMY;  Surgeon: Daneil Dolin, MD;  Location: [facility];  Service: Endoscopy;;  ileocecal valve  . VARICOSE VEIN SURGERY     . warts           Family History  Problem Relation Age of Onset  . Diabetes Mother   . Hypertension Mother   . Cancer Maternal Grandmother        breast  . Hypertension Father   . Other Father        "weak heart"  . Hypertension Brother   . Cancer Sister        breast    Social History        Tobacco Use  . Smoking status: Former Smoker    Types: Cigarettes  . Smokeless tobacco: Never Used  Vaping Use  . Vaping Use: Never used  Substance Use Topics  . Alcohol use: No  . Drug use: No    Medications: I have reviewed the patient's current medications.      Allergies as of 10/02/2020      Reactions   Cefzil [cefprozil] Other (See Comments)   Caused knot         Medication List       Accurate as of October 02, 2020  1:30 PM. If you have any questions, ask your nurse or doctor.        STOP taking these medications  Suprep Bowel Prep Kit 17.5-3.13-1.6 GM/177ML Soln Generic drug: Na Sulfate-K Sulfate-Mg Sulf Stopped by: Virl Cagey, MD     TAKE these medications   acetaminophen 325 MG tablet Commonly known as: TYLENOL Take 650 mg by mouth 2 (two) times daily as needed (for pain.).   Diclofenac Sodium 2 % Soln as needed.   esomeprazole 40 MG capsule Commonly known as: NEXIUM Take 1 capsule (40 mg total) by mouth daily.   hydrochlorothiazide 12.5 MG capsule Commonly known as: MICROZIDE Take 1 capsule (12.5 mg total) by mouth daily.   HYDROcodone-acetaminophen 5-325 MG tablet Commonly known as: Norco Take 1 tablet by mouth every 6 (six) hours as needed.   levonorgestrel 20 MCG/24HR IUD Commonly known as: MIRENA 1 each by Intrauterine route once.   ondansetron 4 MG disintegrating tablet Commonly known as: Zofran ODT 75m ODT q4 hours prn nausea/vomit   Restasis 0.05 % ophthalmic emulsion Generic drug: cycloSPORINE Place 1 drop into both eyes 2 (two) times daily as needed (dry/irritated eyes.).         ROS:  A comprehensive review of systems was negative except for: Gastrointestinal: positive for abdominal pain, nausea and reflux symptoms Musculoskeletal: positive for back pain, neck pain and joint pain  Blood pressure 135/83, pulse 71, temperature 97.6 F (36.4 C), temperature source Other (Comment), resp. rate 14, height 5' 4"  (1.626 m), weight 195 lb (88.5 kg), SpO2 98 %. Physical Exam Vitals reviewed.  Constitutional:      Appearance: Normal appearance.  HENT:     Head: Normocephalic.     Nose: Nose normal.     Mouth/Throat:     Mouth: Mucous membranes are moist.  Eyes:     Extraocular Movements: Extraocular movements intact.  Cardiovascular:     Rate and Rhythm: Normal rate and regular rhythm.  Pulmonary:     Effort: Pulmonary effort is normal.     Breath sounds: Normal breath sounds.  Abdominal:     General: There is no distension.     Palpations: Abdomen is soft.     Tenderness: There is abdominal tenderness in the right upper quadrant and epigastric area.  Musculoskeletal:        General: No swelling.     Cervical back: Normal range of motion.  Skin:    General: Skin is warm.  Neurological:     General: No focal deficit present.     Mental Status: She is alert and oriented to person, place, and time.  Psychiatric:        Mood and Affect: Mood normal.        Behavior: Behavior normal.        Thought Content: Thought content normal.        Judgment: Judgment normal.     Results: CLINICAL DATA: Left-sided abdominal pain for 3 days  EXAM: CT ABDOMEN AND PELVIS WITH CONTRAST  TECHNIQUE: Multidetector CT imaging of the abdomen and pelvis was performed using the standard protocol following bolus administration of intravenous contrast.  CONTRAST: 1037mOMNIPAQUE IOHEXOL 300 MG/ML SOLN  COMPARISON: 08/01/2003  FINDINGS: Lower chest: No acute abnormality.  Hepatobiliary: Geographic area of relatively decreased attenuation within  the left hepatic lobe adjacent to the porta hepatis measuring approximately 2.5 x 2.5 cm (series 2, image 26). No abnormality is evident within this location on delayed phase imaging. Liver has an otherwise unremarkable appearance. Small gallstone is present within the gallbladder lumen. No pericholecystic inflammatory changes are evident by CT.  Pancreas: Unremarkable. No pancreatic  ductal dilatation or surrounding inflammatory changes.  Spleen: Normal in size without focal abnormality.  Adrenals/Urinary Tract: Unremarkable adrenal glands. Tiny 1-2 mm stone within the lower pole of each kidney. Rounded partially exophytic 9 mm hyperdense lesion emanating posteriorly from the upper pole of the left kidney (series 2, image 24). No hydronephrosis. Urinary bladder is unremarkable for the degree of distension.  Stomach/Bowel: Stomach is within normal limits. Appendix appears normal (series 2, image 61). No evidence of bowel wall thickening, distention, or inflammatory changes.  Vascular/Lymphatic: Mild scattered aortoiliac atherosclerotic calcifications without aneurysm. No abdominopelvic lymphadenopathy.  Reproductive: IUD present within the uterus. Rounded 4.4 cm mass emanating from the anterior uterine body/fundus suggestive of a fibroid. No adnexal abnormality.  Other: No free fluid. No abdominopelvic fluid collection. No pneumoperitoneum. No abdominal wall hernia.  Musculoskeletal: No acute or significant osseous findings.  IMPRESSION: 1. No acute abdominopelvic findings. 2. Tiny bilateral nonobstructing nephrolithiasis. 3. Cholelithiasis without evidence of acute cholecystitis. 4. Rounded 9 mm hyperdense lesion emanating posteriorly from the upper pole of the left kidney, which may represent a hemorrhagic or proteinaceous cyst versus a solid renal mass. Non-emergent contrast-enhanced renal protocol MRI of the abdomen is recommended for further evaluation. 5.  Geographic area of relatively decreased attenuation within the left hepatic lobe adjacent to the porta hepatis measuring approximately 2.5 x 2.5 cm. No abnormality is evident within this location on delayed phase imaging. Findings may represent an area of focal fatty infiltration versus an area of transient hepatic attenuation difference (THAD). This site could be more fully characterized on the previously recommended abdominal MRI. 6. Uterine fundal fibroid measuring 4.4 cm. 7. Aortic atherosclerosis. (ICD10-I70.0).   Electronically Signed By: Davina Poke D.O. On: 08/30/2020 12:22  CLINICAL DATA: Left renal and liver abnormalities on recent CT scan.  EXAM: MRI ABDOMEN WITHOUT AND WITH CONTRAST  TECHNIQUE: Multiplanar multisequence MR imaging of the abdomen was performed both before and after the administration of intravenous contrast.  CONTRAST: 65m GADAVIST GADOBUTROL 1 MMOL/ML IV SOLN  COMPARISON: CT scan 08/30/2020  FINDINGS: Lower chest: Unremarkable.  Hepatobiliary: 3.0 x 2.2 cm oval region of decreased perfusion identified in posterior segment IV, along the gallbladder fossa. This corresponds to the area of decreased attenuation identified on previous CT scan. This area is isointense to background liver parenchyma on all precontrast imaging sequences, does not show loss of signal intensity on out of phase were fat suppressed T1 imaging, and demonstrates no restricted diffusion. The only postcontrast sequence on which it is visible is the arterial phase early sequence with this region showing signal intensity of background liver parenchyma on all later post-contrast phases. Liver parenchyma otherwise unremarkable. Numerous tiny gallstones evident No intrahepatic or extrahepatic biliary dilation.  Pancreas: No focal mass lesion. No dilatation of the main duct. No intraparenchymal cyst. No peripancreatic edema.  Spleen: No splenomegaly. No  focal mass lesion.  Adrenals/Urinary Tract: No adrenal nodule or mass. Right kidney unremarkable. Tiny subcapsular lesion posterior upper pole left kidney shows high signal intensity on precontrast T1 imaging (axial image 50/series 12) with no enhancement after IV contrast administration. Imaging features compatible with benign Bosniak II cyst. Similar 6 mm T1 hyperattenuating lesion noted in the extreme upper pole left kidney, also with no demonstrable enhancement after IV contrast administration. 4 mm cortical lesion towards the lower pole left kidney is too small to characterize but likely a benign cyst.  Stomach/Bowel: Stomach is unremarkable. No gastric wall thickening. No evidence of outlet obstruction. Duodenum is normally positioned as  is the ligament of Treitz. No small bowel or colonic dilatation within the visualized abdomen.  Vascular/Lymphatic: No abdominal aortic aneurysm There is no gastrohepatic or hepatoduodenal ligament lymphadenopathy. No retroperitoneal or mesenteric lymphadenopathy.  Other: No intraperitoneal free fluid.  Musculoskeletal: No focal suspicious marrow enhancement within the visualized bony anatomy.  IMPRESSION: 1. 3.0 x 2.2 cm oval region of decreased perfusion noted in posterior segment IV, along the gallbladder fossa corresponding to the abnormality on the recent CT scan. This finding is visible only on the arterial phase postcontrast imaging, being isointense to background liver parenchyma on all other pulse sequences including delayed postcontrast imaging. No restricted diffusion. This is most likely related to a focal area of perfusion anomaly. Follow-up MR and 3-6 months could be used to ensure stability. 2. Tiny Bosniak II cysts in the left kidney, corresponding to the 9 mm hyperdense lesion identified on recent CT scan. Additional very tiny lesions in the left kidney are technically too small to characterize but most likely  benign. 3. Cholelithiasis.   Electronically Signed By: Misty Stanley M.D. On: 09/24/2020 09:04  Assessment & Plan:  Sharnelle Cappelli is a 50 y.o. female with gallstones that sound symptomatic as well as a kidney cyst and a area on the liver that is likely a perfusion anomaly in the gallbladder fossa. She is ultimately going to need to get her gallbladder out but will also need follow up MRI per the report.   -PCP can get follow up MRI -Will touch base with radiology about liver area again to ensure nothing else concerning -Will touch base with Urology about the cyst on the kidney PLAN: I counseled the patient about the indication, risks and benefits of laparoscopic cholecystectomy.  She understands there is a very small chance for bleeding, infection, injury to normal structures (including common bile duct), conversion to open surgery, persistent symptoms, evolution of postcholecystectomy diarrhea, need for secondary interventions, anesthesia reaction, cardiopulmonary issues and other risks not specifically detailed here. I described the expected recovery, the plan for follow-up and the restrictions during the recovery phase.  All questions were answered.  Discussed preop COVID testing and discussed possibility of cases being canceled in the upcoming weeks due to hospital shortages/ [place].   All questions were answered to the satisfaction of the patient.   Virl Cagey 10/02/2020, 1:30 PM

## 2020-10-14 NOTE — Patient Instructions (Signed)
Kelsey Cooke  10/14/2020     @PREFPERIOPPHARMACY @   Your procedure is scheduled on  10/17/2020.   Report to [facility] at  0900  A.M.   Call this number if you have problems the morning of surgery:  [phone]     Remember:  Do not eat or drink after midnight.                       Take these medicines the morning of surgery with A SIP OF WATER  Nexium, hydrocodone (if needed).      Do not wear jewelry, make-up or nail polish.  Do not wear lotions, powders, or perfumes, or deodorant.  Do not shave 48 hours prior to surgery.  Men may shave face and neck.  Do not bring valuables to the hospital.  [facility] is not responsible for any belongings or valuables.  Shower with CHG the night before and the morning of your procedure.   DO NOT use CHG on face, hair or genitals.   After your night shower, dry off with a clean towel, put on clean clothes to sleep in and place clean sheets on your bed before you sleep.           DO NOT sleep with any pets this night.   After your morning shower,dry off with a clean towel, put on clean comfortable clothes to sleep in and brush your teeth before you come to the hospital.    Contacts, dentures or bridgework may not be worn into surgery.  Leave your suitcase in the car.  After surgery it may be brought to your room.  For patients admitted to the hospital, discharge time will be determined by your treatment team.  Patients discharged the day of surgery will not be allowed to drive home and must have someone with them for 24 hours.    Special instructions:   DO NOT smoke tobacco or vape the morning of your procedure.   Please read over the following fact sheets that you were given. Coughing and Deep Breathing, Surgical Site Infection Prevention, Anesthesia Post-op Instructions and Care and Recovery After Surgery       Minimally Invasive Cholecystectomy Minimally invasive cholecystectomy is surgery to remove the  gallbladder. The gallbladder is a pear-shaped organ that lies beneath the liver on the right side of the body. The gallbladder stores bile, which is a fluid that helps the body digest fats. Cholecystectomy is often done to treat inflammation of the gallbladder (cholecystitis). This condition is usually caused by a buildup of gallstones (cholelithiasis) in the gallbladder. Gallstones can block the flow of bile, which can result in inflammation and pain. In severe cases, emergency surgery may be required. This procedure is done though small incisions in the abdomen, instead of one large incision. It is also called laparoscopic surgery. A thin scope with a camera (laparoscope) is inserted through one incision. Then surgical instruments are inserted through the other incisions. In some cases, a minimally invasive surgery may need to be changed to a surgery that is done through a larger incision. This is called open surgery. Tell a health care provider about:  Any allergies you have.  All medicines you are taking, including vitamins, herbs, eye drops, creams, and over-the-counter medicines.  Any problems you or family members have had with anesthetic medicines.  Any blood disorders you have.  Any surgeries you have had.  Any medical conditions you have.  Whether you are pregnant or may be pregnant. What are the risks? Generally, this is a safe procedure. However, problems may occur, including:  Infection.  Bleeding.  Allergic reactions to medicines.  Damage to nearby structures or organs.  A stone remaining in the common bile duct. The common bile duct carries bile from the gallbladder into the small intestine.  A bile leak from the cyst duct that is clipped when your gallbladder is removed. What happens before the procedure? Staying hydrated Follow instructions from your health care provider about hydration, which may include:  Up to 2 hours before the procedure - you may continue to  drink clear liquids, such as water, clear fruit juice, black coffee, and plain tea.   Eating and drinking restrictions Follow instructions from your health care provider about eating and drinking, which may include:  8 hours before the procedure - stop eating heavy meals or foods, such as meat, fried foods, or fatty foods.  6 hours before the procedure - stop eating light meals or foods, such as toast or cereal.  6 hours before the procedure - stop drinking milk or drinks that contain milk.  2 hours before the procedure - stop drinking clear liquids. Medicines Ask your health care provider about:  Changing or stopping your regular medicines. This is especially important if you are taking diabetes medicines or blood thinners.  Taking medicines such as aspirin and ibuprofen. These medicines can thin your blood. Do not take these medicines unless your health care provider tells you to take them.  Taking over-the-counter medicines, vitamins, herbs, and supplements. General instructions  Let your health care provider know if you develop a cold or an infection before surgery.  Plan to have someone take you home from the hospital or clinic.  If you will be going home right after the procedure, plan to have someone with you for 24 hours.  Ask your health care provider: ? How your surgery site will be marked. ? What steps will be taken to help prevent infection. These may include:  Removing hair at the surgery site.  Washing skin with a germ-killing soap.  Taking antibiotic medicine. What happens during the procedure?  An IV will be inserted into one of your veins.  You will be given one or both of the following: ? A medicine to help you relax (sedative). ? A medicine to make you fall asleep (general anesthetic).  A breathing tube will be placed in your mouth.  Your surgeon will make several small incisions in your abdomen.  The laparoscope will be inserted through one of the  small incisions. The camera on the laparoscope will send images to a monitor in the operating room. This lets your surgeon see inside your abdomen.  A gas will be pumped into your abdomen. This will expand your abdomen to give the surgeon more room to perform the surgery.  Other tools that are needed for the procedure will be inserted through the other incisions. The gallbladder will be removed through one of the incisions.  Your common bile duct may be examined. If stones are found in the common bile duct, they may be removed.  After your gallbladder has been removed, the incisions will be closed with stitches (sutures), staples, or skin glue.  Your incisions may be covered with a bandage (dressing). The procedure may vary among health care providers and hospitals.   What happens after the procedure?  Your blood pressure, heart rate, breathing rate, and blood oxygen  level will be monitored until you leave the hospital or clinic.  You will be given medicines as needed to control your pain.  If you were given a sedative during the procedure, it can affect you for several hours. Do not drive or operate machinery until your health care provider says that it is safe. Summary  Minimally invasive cholecystectomy, also called laparoscopic cholecystectomy, is surgery to remove the gallbladder using small incisions.  Tell your health care provider about all the medical conditions you have and all the medicines you are taking for those conditions.  Before the procedure, follow instructions about eating or drinking restrictions and changing or stopping medicines.  If you were given a sedative during the procedure, it can affect you for several hours. Do not drive or operate machinery until your health care provider says that it is safe. This information is not intended to replace advice given to you by your health care provider. Make sure you discuss any questions you have with your health care  provider. Document Revised: 05/21/2019 Document Reviewed: 05/21/2019 Elsevier Patient Education  2021 [place] Anesthesia, Adult, Care After This sheet gives you information about how to care for yourself after your procedure. Your health care provider may also give you more specific instructions. If you have problems or questions, contact your health care provider. What can I expect after the procedure? After the procedure, the following side effects are common:  Pain or discomfort at the IV site.  Nausea.  Vomiting.  Sore throat.  Trouble concentrating.  Feeling cold or chills.  Feeling weak or tired.  Sleepiness and fatigue.  Soreness and body aches. These side effects can affect parts of the body that were not involved in surgery. Follow these instructions at home: For the time period you were told by your health care provider:  Rest.  Do not participate in activities where you could fall or become injured.  Do not drive or use machinery.  Do not drink alcohol.  Do not take sleeping pills or medicines that cause drowsiness.  Do not make important decisions or sign legal documents.  Do not take care of children on your own.   Eating and drinking  Follow any instructions from your health care provider about eating or drinking restrictions.  When you feel hungry, start by eating small amounts of foods that are soft and easy to digest (bland), such as toast. Gradually return to your regular diet.  Drink enough fluid to keep your urine pale yellow.  If you vomit, rehydrate by drinking water, juice, or clear broth. General instructions  If you have sleep apnea, surgery and certain medicines can increase your risk for breathing problems. Follow instructions from your health care provider about wearing your sleep device: ? Anytime you are sleeping, including during daytime naps. ? While taking prescription pain medicines, sleeping medicines, or  medicines that make you drowsy.  Have a responsible adult stay with you for the time you are told. It is important to have someone help care for you until you are awake and alert.  Return to your normal activities as told by your health care provider. Ask your health care provider what activities are safe for you.  Take over-the-counter and prescription medicines only as told by your health care provider.  If you smoke, do not smoke without supervision.  Keep all follow-up visits as told by your health care provider. This is important. Contact a health care provider if:  You have nausea  or vomiting that does not get better with medicine.  You cannot eat or drink without vomiting.  You have pain that does not get better with medicine.  You are unable to pass urine.  You develop a skin rash.  You have a fever.  You have redness around your IV site that gets worse. Get help right away if:  You have difficulty breathing.  You have chest pain.  You have blood in your urine or stool, or you vomit blood. Summary  After the procedure, it is common to have a sore throat or nausea. It is also common to feel tired.  Have a responsible adult stay with you for the time you are told. It is important to have someone help care for you until you are awake and alert.  When you feel hungry, start by eating small amounts of foods that are soft and easy to digest (bland), such as toast. Gradually return to your regular diet.  Drink enough fluid to keep your urine pale yellow.  Return to your normal activities as told by your health care provider. Ask your health care provider what activities are safe for you. This information is not intended to replace advice given to you by your health care provider. Make sure you discuss any questions you have with your health care provider. Document Revised: 05/01/2020 Document Reviewed: 11/29/2019 Elsevier Patient Education  2021 [employer].

## 2020-10-16 ENCOUNTER — Encounter (HOSPITAL_COMMUNITY)
Admission: RE | Admit: 2020-10-16 | Discharge: 2020-10-16 | Disposition: A | Discharge status: Home / Self Care | Payer: BC Managed Care – PPO | Source: Ambulatory Visit | Attending: General Surgery | Admitting: General Surgery

## 2020-10-16 ENCOUNTER — Other Ambulatory Visit (HOSPITAL_COMMUNITY)
Admission: RE | Admit: 2020-10-16 | Discharge: 2020-10-16 | Disposition: A | Discharge status: Home / Self Care | Payer: BC Managed Care – PPO | Source: Ambulatory Visit | Attending: General Surgery | Admitting: General Surgery

## 2020-10-16 ENCOUNTER — Other Ambulatory Visit: Payer: Self-pay

## 2020-10-16 DIAGNOSIS — K8020 Calculus of gallbladder without cholecystitis without obstruction: Secondary | ICD-10-CM | POA: Diagnosis not present

## 2020-10-16 DIAGNOSIS — Z881 Allergy status to other antibiotic agents status: Secondary | ICD-10-CM | POA: Diagnosis not present

## 2020-10-16 DIAGNOSIS — Z793 Long term (current) use of hormonal contraceptives: Secondary | ICD-10-CM | POA: Diagnosis not present

## 2020-10-16 DIAGNOSIS — Z20822 Contact with and (suspected) exposure to covid-19: Secondary | ICD-10-CM | POA: Insufficient documentation

## 2020-10-16 DIAGNOSIS — Z01818 Encounter for other preprocedural examination: Secondary | ICD-10-CM | POA: Insufficient documentation

## 2020-10-16 DIAGNOSIS — Z975 Presence of (intrauterine) contraceptive device: Secondary | ICD-10-CM | POA: Diagnosis not present

## 2020-10-16 DIAGNOSIS — K8010 Calculus of gallbladder with chronic cholecystitis without obstruction: Secondary | ICD-10-CM | POA: Diagnosis not present

## 2020-10-16 DIAGNOSIS — Z87891 Personal history of nicotine dependence: Secondary | ICD-10-CM | POA: Diagnosis not present

## 2020-10-16 LAB — BASIC METABOLIC PANEL
Anion gap: 8 (ref 5–15)
BUN: 16 mg/dL (ref 6–20)
CO2: 27 mmol/L (ref 22–32)
Calcium: 9 mg/dL (ref 8.9–10.3)
Chloride: 102 mmol/L (ref 98–111)
Creatinine, Ser: 0.56 mg/dL (ref 0.44–1.00)
GFR, Estimated: >60 mL/min (ref >60)
Glucose, Bld: 95 mg/dL (ref 70–99)
Potassium: 3.8 mmol/L (ref 3.5–5.1)
Sodium: 137 mmol/L (ref 135–145)

## 2020-10-16 LAB — SARS CORONAVIRUS 2 (TAT 6-24 HRS): SARS Coronavirus 2: NEGATIVE

## 2020-10-16 LAB — HCG, SERUM, QUALITATIVE: Preg, Serum: NEGATIVE

## 2020-10-17 ENCOUNTER — Ambulatory Visit (HOSPITAL_COMMUNITY): Payer: BC Managed Care – PPO | Admitting: Anesthesiology

## 2020-10-17 ENCOUNTER — Encounter (HOSPITAL_COMMUNITY)
Admission: RE | Disposition: A | Discharge status: Home / Self Care | Payer: Self-pay | Source: Home / Self Care | Attending: General Surgery

## 2020-10-17 ENCOUNTER — Ambulatory Visit (HOSPITAL_COMMUNITY)
Admission: RE | Admit: 2020-10-17 | Discharge: 2020-10-17 | Disposition: A | Discharge status: Home / Self Care | Payer: BC Managed Care – PPO | Attending: General Surgery | Admitting: General Surgery

## 2020-10-17 ENCOUNTER — Other Ambulatory Visit: Payer: Self-pay

## 2020-10-17 DIAGNOSIS — K8020 Calculus of gallbladder without cholecystitis without obstruction: Secondary | ICD-10-CM | POA: Diagnosis present

## 2020-10-17 DIAGNOSIS — Z975 Presence of (intrauterine) contraceptive device: Secondary | ICD-10-CM | POA: Insufficient documentation

## 2020-10-17 DIAGNOSIS — Z881 Allergy status to other antibiotic agents status: Secondary | ICD-10-CM | POA: Diagnosis not present

## 2020-10-17 DIAGNOSIS — Z20822 Contact with and (suspected) exposure to covid-19: Secondary | ICD-10-CM | POA: Insufficient documentation

## 2020-10-17 DIAGNOSIS — K8010 Calculus of gallbladder with chronic cholecystitis without obstruction: Secondary | ICD-10-CM | POA: Diagnosis not present

## 2020-10-17 DIAGNOSIS — Z793 Long term (current) use of hormonal contraceptives: Secondary | ICD-10-CM | POA: Insufficient documentation

## 2020-10-17 DIAGNOSIS — Z87891 Personal history of nicotine dependence: Secondary | ICD-10-CM | POA: Insufficient documentation

## 2020-10-17 HISTORY — PX: CHOLECYSTECTOMY: SHX55

## 2020-10-17 SURGERY — LAPAROSCOPIC CHOLECYSTECTOMY
Anesthesia: General

## 2020-10-17 MED ORDER — LIDOCAINE HCL (PF) 2 % IJ SOLN
INTRAMUSCULAR | Status: AC
  Filled 2020-10-17: qty 5

## 2020-10-17 MED ORDER — LIDOCAINE HCL (CARDIAC) PF 50 MG/5ML IV SOSY
PREFILLED_SYRINGE | INTRAVENOUS | Status: DC | PRN
  Administered 2020-10-17: 50 mg via INTRAVENOUS

## 2020-10-17 MED ORDER — SUCCINYLCHOLINE CHLORIDE 20 MG/ML IJ SOLN
INTRAMUSCULAR | Status: DC | PRN
  Administered 2020-10-17: 120 mg via INTRAVENOUS

## 2020-10-17 MED ORDER — CIPROFLOXACIN IN D5W 400 MG/200ML IV SOLN
INTRAVENOUS | Status: AC
  Filled 2020-10-17: qty 200

## 2020-10-17 MED ORDER — CHLORHEXIDINE GLUCONATE CLOTH 2 % EX PADS: 6.0000 | MEDICATED_PAD | Freq: Once | CUTANEOUS | Status: DC

## 2020-10-17 MED ORDER — SUGAMMADEX SODIUM 500 MG/5ML IV SOLN
INTRAVENOUS | Status: DC | PRN
  Administered 2020-10-17: 200 mg via INTRAVENOUS

## 2020-10-17 MED ORDER — LACTATED RINGERS IV SOLN: INTRAVENOUS | Status: DC

## 2020-10-17 MED ORDER — OXYCODONE HCL 5 MG PO TABS: 5.0000 mg | ORAL_TABLET | ORAL | 0 refills | Status: DC | PRN

## 2020-10-17 MED ORDER — SODIUM CHLORIDE FLUSH 0.9 % IV SOLN
INTRAVENOUS | Status: AC
  Filled 2020-10-17: qty 10

## 2020-10-17 MED ORDER — CEFAZOLIN SODIUM-DEXTROSE 2-4 GM/100ML-% IV SOLN
2.0000 g | INTRAVENOUS | Status: DC
  Filled 2020-10-17: qty 100

## 2020-10-17 MED ORDER — PROPOFOL 10 MG/ML IV BOLUS
INTRAVENOUS | Status: AC
  Filled 2020-10-17: qty 20

## 2020-10-17 MED ORDER — CHLORHEXIDINE GLUCONATE 0.12 % MT SOLN
15.0000 mL | Freq: Once | OROMUCOSAL | Status: AC
  Administered 2020-10-17: 15 mL via OROMUCOSAL
  Filled 2020-10-17: qty 15

## 2020-10-17 MED ORDER — BUPIVACAINE HCL (PF) 0.5 % IJ SOLN
INTRAMUSCULAR | Status: DC | PRN
  Administered 2020-10-17: 10 mL

## 2020-10-17 MED ORDER — SODIUM CHLORIDE 0.9 % IR SOLN
Status: DC | PRN
  Administered 2020-10-17: 1000 mL

## 2020-10-17 MED ORDER — DEXAMETHASONE SODIUM PHOSPHATE 10 MG/ML IJ SOLN
INTRAMUSCULAR | Status: DC | PRN
  Administered 2020-10-17: 10 mg via INTRAVENOUS

## 2020-10-17 MED ORDER — ONDANSETRON HCL 4 MG/2ML IJ SOLN
INTRAMUSCULAR | Status: AC
  Filled 2020-10-17: qty 2

## 2020-10-17 MED ORDER — PROMETHAZINE HCL 25 MG/ML IJ SOLN
6.2500 mg | INTRAMUSCULAR | Status: DC | PRN
  Administered 2020-10-17: 12.5 mg via INTRAVENOUS
  Filled 2020-10-17: qty 1

## 2020-10-17 MED ORDER — MEPERIDINE HCL 50 MG/ML IJ SOLN: 6.2500 mg | INTRAMUSCULAR | Status: DC | PRN

## 2020-10-17 MED ORDER — CEFAZOLIN SODIUM-DEXTROSE 1-4 GM/50ML-% IV SOLN
INTRAVENOUS | Status: AC
  Filled 2020-10-17: qty 50

## 2020-10-17 MED ORDER — FENTANYL CITRATE (PF) 100 MCG/2ML IJ SOLN
INTRAMUSCULAR | Status: DC | PRN
  Administered 2020-10-17 (×3): 50 ug via INTRAVENOUS
  Administered 2020-10-17: 100 ug via INTRAVENOUS

## 2020-10-17 MED ORDER — ROCURONIUM BROMIDE 100 MG/10ML IV SOLN
INTRAVENOUS | Status: DC | PRN
  Administered 2020-10-17: 40 mg via INTRAVENOUS

## 2020-10-17 MED ORDER — DEXAMETHASONE SODIUM PHOSPHATE 10 MG/ML IJ SOLN
INTRAMUSCULAR | Status: AC
  Filled 2020-10-17: qty 1

## 2020-10-17 MED ORDER — SCOPOLAMINE 1 MG/3DAYS TD PT72
1.0000 | MEDICATED_PATCH | Freq: Once | TRANSDERMAL | Status: DC
  Administered 2020-10-17: 1.5 mg via TRANSDERMAL
  Filled 2020-10-17: qty 1

## 2020-10-17 MED ORDER — PROPOFOL 10 MG/ML IV BOLUS
INTRAVENOUS | Status: DC | PRN
  Administered 2020-10-17: 20 mg via INTRAVENOUS
  Administered 2020-10-17: 180 mg via INTRAVENOUS

## 2020-10-17 MED ORDER — CIPROFLOXACIN IN D5W 400 MG/200ML IV SOLN
400.0000 mg | Freq: Two times a day (BID) | INTRAVENOUS | Status: DC
  Administered 2020-10-17: 400 mg via INTRAVENOUS

## 2020-10-17 MED ORDER — ONDANSETRON HCL 4 MG/2ML IJ SOLN
INTRAMUSCULAR | Status: DC | PRN
  Administered 2020-10-17: 4 mg via INTRAVENOUS

## 2020-10-17 MED ORDER — SUCCINYLCHOLINE CHLORIDE 200 MG/10ML IV SOSY
PREFILLED_SYRINGE | INTRAVENOUS | Status: AC
  Filled 2020-10-17: qty 10

## 2020-10-17 MED ORDER — BUPIVACAINE HCL (PF) 0.5 % IJ SOLN
INTRAMUSCULAR | Status: AC
  Filled 2020-10-17: qty 30

## 2020-10-17 MED ORDER — HEMOSTATIC AGENTS (NO CHARGE) OPTIME
TOPICAL | Status: DC | PRN
  Administered 2020-10-17: 1 via TOPICAL

## 2020-10-17 MED ORDER — FENTANYL CITRATE (PF) 250 MCG/5ML IJ SOLN
INTRAMUSCULAR | Status: AC
  Filled 2020-10-17: qty 5

## 2020-10-17 MED ORDER — ORAL CARE MOUTH RINSE: 15.0000 mL | Freq: Once | OROMUCOSAL | Status: AC

## 2020-10-17 MED ORDER — ROCURONIUM BROMIDE 10 MG/ML (PF) SYRINGE
PREFILLED_SYRINGE | INTRAVENOUS | Status: AC
  Filled 2020-10-17: qty 10

## 2020-10-17 MED ORDER — ONDANSETRON 4 MG PO TBDP: ORAL_TABLET | ORAL | 0 refills | Status: DC

## 2020-10-17 MED ORDER — HYDROMORPHONE HCL 1 MG/ML IJ SOLN
0.2500 mg | INTRAMUSCULAR | Status: DC | PRN
  Administered 2020-10-17 (×2): 0.5 mg via INTRAVENOUS
  Filled 2020-10-17 (×2): qty 0.5

## 2020-10-17 SURGICAL SUPPLY — 42 items
ADH SKN CLS APL DERMABOND .7 (GAUZE/BANDAGES/DRESSINGS) ×1
APL PRP STRL LF DISP 70% ISPRP (MISCELLANEOUS) ×1
APPLIER CLIP ROT 10 11.4 M/L (STAPLE) ×2
APR CLP MED LRG 11.4X10 (STAPLE) ×1
BAG RETRIEVAL 10 (BASKET) ×1
BLADE SURG 15 STRL LF DISP TIS (BLADE) ×1 IMPLANT
BLADE SURG 15 STRL SS (BLADE) ×2
CHLORAPREP W/TINT 26 (MISCELLANEOUS) ×2 IMPLANT
CLIP APPLIE ROT 10 11.4 M/L (STAPLE) ×1 IMPLANT
CLOTH BEACON ORANGE TIMEOUT ST (SAFETY) ×2 IMPLANT
COVER LIGHT HANDLE STERIS (MISCELLANEOUS) ×4 IMPLANT
COVER WAND RF STERILE (DRAPES) ×2 IMPLANT
DECANTER SPIKE VIAL GLASS SM (MISCELLANEOUS) ×2 IMPLANT
DERMABOND ADVANCED (GAUZE/BANDAGES/DRESSINGS) ×1
DERMABOND ADVANCED .7 DNX12 (GAUZE/BANDAGES/DRESSINGS) ×1 IMPLANT
ELECT REM PT RETURN 9FT ADLT (ELECTROSURGICAL) ×2
ELECTRODE REM PT RTRN 9FT ADLT (ELECTROSURGICAL) ×1 IMPLANT
GLOVE SURG ENC MOIS LTX SZ6.5 (GLOVE) ×4 IMPLANT
GLOVE SURG SS PI 7.5 STRL IVOR (GLOVE) ×2 IMPLANT
GLOVE SURG UNDER POLY LF SZ6.5 (GLOVE) ×4 IMPLANT
GLOVE SURG UNDER POLY LF SZ7 (GLOVE) ×6 IMPLANT
GOWN STRL REUS W/TWL LRG LVL3 (GOWN DISPOSABLE) ×6 IMPLANT
HEMOSTAT SNOW SURGICEL 2X4 (HEMOSTASIS) ×2 IMPLANT
INST SET LAPROSCOPIC AP (KITS) ×2 IMPLANT
KIT TURNOVER KIT A (KITS) ×2 IMPLANT
MANIFOLD NEPTUNE II (INSTRUMENTS) ×2 IMPLANT
NEEDLE INSUFFLATION 14GA 120MM (NEEDLE) ×2 IMPLANT
NS IRRIG 1000ML POUR BTL (IV SOLUTION) ×2 IMPLANT
PACK LAP CHOLE LZT030E (CUSTOM PROCEDURE TRAY) ×2 IMPLANT
PAD ARMBOARD 7.5X6 YLW CONV (MISCELLANEOUS) ×2 IMPLANT
SET BASIN LINEN APH (SET/KITS/TRAYS/PACK) ×2 IMPLANT
SET TUBE SMOKE EVAC HIGH FLOW (TUBING) ×2 IMPLANT
SLEEVE ENDOPATH XCEL 5M (ENDOMECHANICALS) ×2 IMPLANT
SUT MNCRL AB 4-0 PS2 18 (SUTURE) ×4 IMPLANT
SUT VICRYL 0 UR6 27IN ABS (SUTURE) ×2 IMPLANT
SYS BAG RETRIEVAL 10MM (BASKET) ×1
SYSTEM BAG RETRIEVAL 10MM (BASKET) ×1 IMPLANT
TROCAR ENDO BLADELESS 11MM (ENDOMECHANICALS) ×2 IMPLANT
TROCAR XCEL NON-BLD 5MMX100MML (ENDOMECHANICALS) ×2 IMPLANT
TROCAR XCEL UNIV SLVE 11M 100M (ENDOMECHANICALS) ×2 IMPLANT
TUBE CONNECTING 12X1/4 (SUCTIONS) ×2 IMPLANT
WARMER LAPAROSCOPE (MISCELLANEOUS) ×2 IMPLANT

## 2020-10-17 NOTE — Discharge Instructions (Signed)
Discharge Laparoscopic Surgery Instructions:  Common Complaints: Right shoulder pain is common after laparoscopic surgery. This is secondary to the gas used in the surgery being trapped under the diaphragm.  Walk to help your body absorb the gas. This will improve in a few days. Pain at the port sites are common, especially the larger port sites. This will improve with time.  Some nausea is common and poor appetite. The main goal is to stay hydrated the first few days after surgery.   Diet/ Activity: Diet as tolerated. You may not have an appetite, but it is important to stay hydrated. Drink 64 ounces of water a day. Your appetite will return with time.  Shower per your regular routine daily.  Do not take hot showers. Take warm showers that are less than 10 minutes. Rest and listen to your body, but do not remain in bed all day.  Walk everyday for at least 15-20 minutes. Deep cough and move around every 1-2 hours in the first few days after surgery.  Do not lift > 10 lbs, perform excessive bending, pushing, pulling, squatting for 1-2 weeks after surgery.  Do not pick at the dermabond glue on your incision sites.  This glue film will remain in place for 1-2 weeks and will start to peel off.  Do not place lotions or balms on your incision unless instructed to specifically by Dr. Bridges.   Pain Expectations and Narcotics: -After surgery you will have pain associated with your incisions and this is normal. The pain is muscular and nerve pain, and will get better with time. -You are encouraged and expected to take non narcotic medications like tylenol and ibuprofen (when able) to treat pain as multiple modalities can aid with pain treatment. -Narcotics are only used when pain is severe or there is breakthrough pain. -You are not expected to have a pain score of 0 after surgery, as we cannot prevent pain. A pain score of 3-4 that allows you to be functional, move, walk, and tolerate some activity is  the goal. The pain will continue to improve over the days after surgery and is dependent on your surgery. -Due to [place] law, we are only able to give a certain amount of pain medication to treat post operative pain, and we only give additional narcotics on a patient by patient basis.  -For most laparoscopic surgery, studies have shown that the majority of patients only need 10-15 narcotic pills, and for open surgeries most patients only need 15-20.   -Having appropriate expectations of pain and knowledge of pain management with non narcotics is important as we do not want anyone to become addicted to narcotic pain medication.  -Using ice packs in the first 48 hours and heating pads after 48 hours, wearing an abdominal binder (when recommended), and using over the counter medications are all ways to help with pain management.   -Simple acts like meditation and mindfulness practices after surgery can also help with pain control and research has proven the benefit of these practices.  Medication: Take tylenol and ibuprofen as needed for pain control, alternating every 4-6 hours.  Example:  Tylenol 1000mg @ 6am, 12noon, 6pm, 12midnight (Do not exceed 4000mg of tylenol a day). Ibuprofen 800mg @ 9am, 3pm, 9pm, 3am (Do not exceed 3600mg of ibuprofen a day).  Take Roxicodone for breakthrough pain every 4 hours.  Take Colace for constipation related to narcotic pain medication. If you do not have a bowel movement in 2 days, take Miralax   over the counter.  Drink plenty of water to also prevent constipation.   Contact Information: If you have questions or concerns, please call our office, [phone], Monday- Thursday 8AM-5PM and Friday 8AM-12Noon.  If it is after hours or on the weekend, please call [facility] Main Number, [phone], [phone], and ask to speak to the surgeon on call for Dr. Constance Haw at [facility].   Minimally Invasive Cholecystectomy, Care After This sheet gives you information about how  to care for yourself after your procedure. Your health care provider may also give you more specific instructions. If you have problems or questions, contact your health care provider. What can I expect after the procedure? After the procedure, it is common to have:  Pain at your incision sites. You will be given medicines to control this pain.  Mild nausea or vomiting.  Bloating and possible shoulder pain from the gas that was used during the procedure. Follow these instructions at home: Medicines  Take over-the-counter and prescription medicines only as told by your health care provider.  If you were prescribed an antibiotic medicine, take or use it as told by your health care provider. Do not stop using the antibiotic even if you start to feel better.  Ask your health care provider if the medicine prescribed to you: ? Requires you to avoid driving or using machinery. ? Can cause constipation. You may need to take these actions to prevent or treat constipation:  Drink enough fluid to keep your urine pale yellow.  Take over-the-counter or prescription medicines.  Eat foods that are high in fiber, such as beans, whole grains, and fresh fruits and vegetables.  Limit foods that are high in fat and processed sugars, such as fried or sweet foods. Incision care  Follow instructions from your health care provider about how to take care of your incisions. Make sure you: ? Wash your hands with soap and water for at least 20 seconds before and after you change your bandage (dressing). If soap and water are not available, use hand sanitizer. ? Change your dressing as told by your health care provider. ? Leave stitches (sutures), skin glue, or adhesive strips in place. These skin closures may need to be in place for 2 weeks or longer. If adhesive strip edges start to loosen and curl up, you may trim the loose edges. Do not remove adhesive strips completely unless your health care provider tells  you to do that.  Do not take baths, swim, or use a hot tub until your health care provider approves.  You may shower.   Check your incision area every day for signs of infection. Check for: ? More redness, swelling, or pain. ? Fluid or blood. ? Warmth. ? Pus or a bad smell.   Activity  Rest as told by your health care provider.  Avoid sitting for a long time without moving. Get up to take short walks every 1-2 hours. This is important to improve blood flow and breathing. Ask for help if you feel weak or unsteady.  Do not lift anything that is heavier than 10 lb (4.5 kg), or the limit that you are told, until your health care provider says that it is safe.  Do not play contact sports until your health care provider approves.  Do not return to work or school until your health care provider approves.  Return to your normal activities as told by your health care provider. Ask your health care provider what activities are safe for  you. General instructions  If you were given a sedative during the procedure, it can affect you for several hours. Do not drive or operate machinery until your health care provider says that it is safe.  Keep all follow-up visits as told by your health care provider. This is important. Contact a health care provider if:  You develop a rash.  You have more redness, swelling, or pain around your incisions.  You have fluid or blood coming from your incisions.  Your incisions feel warm to the touch.  You have pus or a bad smell coming from your incisions.  You have a fever.  One or more of your incisions breaks open. Get help right away if:  You have trouble breathing.  You have chest pain.  You have increasing pain in your shoulders.  You faint or feel dizzy when you stand.  You have severe pain in your abdomen.  You have nausea or vomiting that lasts for more than one day.  You have leg pain. Summary  After your procedure, it is common to  have pain at the incision sites. You may also have nausea or bloating.  Follow your health care provider's instructions about medicine, activity restrictions, and caring for your incision areas. Do not do activities that require a lot of effort.  Contact a health care provider if you have a fever or other signs of infection, such as more redness, swelling, or pain around the incisions.  Get help right away if you have chest pain, increasing pain in the shoulders, or trouble breathing. This information is not intended to replace advice given to you by your health care provider. Make sure you discuss any questions you have with your health care provider. Document Revised: 05/16/2019 Document Reviewed: 05/16/2019 Elsevier Patient Education  2021 [place].  PATIENT INSTRUCTIONS POST-ANESTHESIA  IMMEDIATELY FOLLOWING SURGERY:  Do not drive or operate machinery for the first twenty four hours after surgery.  Do not make any important decisions for twenty four hours after surgery or while taking narcotic pain medications or sedatives.  If you develop intractable nausea and vomiting or a severe headache please notify your doctor immediately.  FOLLOW-UP:  Please make an appointment with your surgeon as instructed. You do not need to follow up with anesthesia unless specifically instructed to do so.  WOUND CARE INSTRUCTIONS (if applicable):  Keep a dry clean dressing on the anesthesia/puncture wound site if there is drainage.  Once the wound has quit draining you may leave it open to air.  Generally you should leave the bandage intact for twenty four hours unless there is drainage.  If the epidural site drains for more than 36-48 hours please call the anesthesia department.  QUESTIONS?:  Please feel free to call your physician or the hospital operator if you have any questions, and they will be happy to assist you.

## 2020-10-17 NOTE — Transfer of Care (Signed)
Immediate Anesthesia Transfer of Care Note  Patient: Kelsey Cooke  Procedure(s) Performed: LAPAROSCOPIC CHOLECYSTECTOMY (N/A )  Patient Location: PACU  Anesthesia Type:General  Level of Consciousness: awake and patient cooperative  Airway & Oxygen Therapy: Patient Spontanous Breathing  Post-op Assessment: Report given to RN and Post -op Vital signs reviewed and stable  Post vital signs: Reviewed and stable  Last Vitals:  Vitals Value Taken Time  BP 157/99 10/17/20 1130  Temp 97.9   Pulse 75 10/17/20 1131  Resp 11 10/17/20 1131  SpO2 95 % 10/17/20 1131  Vitals shown include unvalidated device data.  Last Pain:  Vitals:   10/17/20 0945  PainSc: 0-No pain         Complications: No complications documented.

## 2020-10-17 NOTE — Anesthesia Postprocedure Evaluation (Signed)
Anesthesia Post Note  Patient: Kelsey Cooke  Procedure(s) Performed: LAPAROSCOPIC CHOLECYSTECTOMY (N/A )  Patient location during evaluation: PACU Anesthesia Type: General Level of consciousness: awake and alert and oriented Pain management: pain level controlled Respiratory status: spontaneous breathing and respiratory function stable Cardiovascular status: stable Postop Assessment: no apparent nausea or vomiting Anesthetic complications: no   No complications documented.   Last Vitals:  Vitals:   10/17/20 1235 10/17/20 1241  BP: (!) 163/106 (!) 170/101  Pulse: (!) 57 63  Resp: 13 18  Temp:    SpO2: 99% 98%    Last Pain:  Vitals:   10/17/20 1241  PainSc: 4                  Jazmina Muhlenkamp C Tyrese Capriotti

## 2020-10-17 NOTE — Op Note (Signed)
Operative Note   Preoperative Diagnosis: Symptomatic cholelithiasis   Postoperative Diagnosis: Same   Procedure(s) Performed: Laparoscopic cholecystectomy   Surgeon: Ria Comment C. Constance Haw, MD   Assistants: Aviva Signs, MD    Anesthesia: General endotracheal   Anesthesiologist: Denese Killings, MD    Specimens: Gallbladder    Estimated Blood Loss: Minimal    Blood Replacement: None    Complications: None    Operative Findings: Normal appearing gallbladder   Procedure: The patient was taken to the operating room and placed supine. General endotracheal anesthesia was induced. Intravenous antibiotics were administered per protocol. An orogastric tube positioned to decompress the stomach. The abdomen was prepared and draped in the usual sterile fashion.    A supraumbilical incision was made and a Veress technique was utilized to achieve pneumoperitoneum to 15 mmHg with carbon dioxide. A 11 mm optiview port was placed through the supraumbilical region, and a 10 mm 0-degree operative laparoscope was introduced. The area underlying the trocar and Veress needle were inspected and without evidence of injury.  Remaining trocars were placed under direct vision. Two 5 mm ports were placed in the right abdomen, between the anterior axillary and midclavicular line.  A final 11 mm port was placed through the mid-epigastrium, near the falciform ligament.    The gallbladder fundus was elevated cephalad and the infundibulum was retracted to the patient's right. The gallbladder/cystic duct junction was skeletonized. The cystic artery noted in the triangle of Calot and was also skeletonized.  We then continued liberal medial and lateral dissection until the critical view of safety was achieved.    The cystic duct and cystic artery were triply clipped and divided. The gallbladder was then dissected from the liver bed with electrocautery. There was a small <60mm size duct structure that was clipped just  posterior to the cystic artery. The specimen was placed in an Endopouch and was retrieved through the epigastric site.   Final inspection revealed acceptable hemostasis. Surgical SNOW was placed in the gallbladder bed.  Trocars were removed and pneumoperitoneum was released.  The epigastric and umbilical port sites were smaller than my fingertip. Skin incisions were closed with 4-0 Monocryl subcuticular sutures and Dermabond. The patient was awakened from anesthesia and extubated without complication.    Curlene Labrum, MD [facility] [address] [place], [place] [address] [phone] (office)

## 2020-10-17 NOTE — Anesthesia Preprocedure Evaluation (Signed)
Anesthesia Evaluation  Patient identified by MRN, date of birth, ID band Patient awake    Reviewed: Allergy & Precautions, NPO status , Patient's Chart, lab work & pertinent test results  History of Anesthesia Complications Negative for: history of anesthetic complications  Airway Mallampati: II  TM Distance: >3 FB Neck ROM: Full    Dental  (+) Dental Advisory Given, Implants, Caps   Pulmonary former smoker,    Pulmonary exam normal breath sounds clear to auscultation       Cardiovascular Exercise Tolerance: Good hypertension, Pt. on medications Normal cardiovascular exam Rhythm:Regular Rate:Normal     Neuro/Psych  Headaches, PSYCHIATRIC DISORDERS Anxiety Depression  Neuromuscular disease    GI/Hepatic Neg liver ROS, GERD  Medicated,  Endo/Other  negative endocrine ROS  Renal/GU negative Renal ROS     Musculoskeletal Neck pain, cervical radiculopathy   Abdominal   Peds  Hematology negative hematology ROS (+)   Anesthesia Other Findings   Reproductive/Obstetrics negative OB ROS                             Anesthesia Physical Anesthesia Plan  ASA: II  Anesthesia Plan: General   Post-op Pain Management:    Induction: Intravenous  PONV Risk Score and Plan: 4 or greater and Ondansetron, Dexamethasone and Midazolam  Airway Management Planned: Oral ETT  Additional Equipment:   Intra-op Plan:   Post-operative Plan: Extubation in OR  Informed Consent: I have reviewed the patients History and Physical, chart, labs and discussed the procedure including the risks, benefits and alternatives for the proposed anesthesia with the patient or authorized representative who has indicated his/her understanding and acceptance.     Dental advisory given  Plan Discussed with: CRNA and Surgeon  Anesthesia Plan Comments:         Anesthesia Quick Evaluation

## 2020-10-17 NOTE — Progress Notes (Signed)
[facility]  Updated husband surgery completed. Patient has Monday off work. If she needs more time she will call the office for a note. Rx sent in to [facility].   Curlene Labrum, MD [facility] [address] [place], [place] [address] [phone] (office)

## 2020-10-17 NOTE — Anesthesia Postprocedure Evaluation (Signed)
Anesthesia Post Note  Patient: Kelsey Cooke  Procedure(s) Performed: LAPAROSCOPIC CHOLECYSTECTOMY (N/A )  Patient location during evaluation: Phase II Anesthesia Type: General Level of consciousness: awake and alert and patient cooperative Pain management: satisfactory to patient Vital Signs Assessment: post-procedure vital signs reviewed and stable Respiratory status: spontaneous breathing Cardiovascular status: stable Postop Assessment: no apparent nausea or vomiting Anesthetic complications: no   No complications documented.   Last Vitals:  Vitals:   10/17/20 1235 10/17/20 1241  BP: (!) 163/106 (!) 170/101  Pulse: (!) 57 63  Resp: 13 18  Temp:    SpO2: 99% 98%    Last Pain:  Vitals:   10/17/20 1241  PainSc: 4                  Darriel Utter

## 2020-10-17 NOTE — Anesthesia Procedure Notes (Signed)
Procedure Name: Intubation Date/Time: 10/17/2020 10:40 AM Performed by: Vista Deck, CRNA Pre-anesthesia Checklist: Patient identified, Patient being monitored, Timeout performed, Emergency Drugs available and Suction available Patient Re-evaluated:Patient Re-evaluated prior to induction Oxygen Delivery Method: Circle System Utilized Preoxygenation: Pre-oxygenation with 100% oxygen Induction Type: IV induction Laryngoscope Size: Mac and 3 Grade View: Grade II Tube type: Oral Tube size: 7.0 mm Number of attempts: 1 Airway Equipment and Method: stylet Placement Confirmation: ETT inserted through vocal cords under direct vision,  positive ETCO2 and breath sounds checked- equal and bilateral Secured at: 22 cm Tube secured with: Tape Dental Injury: Teeth and Oropharynx as per pre-operative assessment

## 2020-10-17 NOTE — Interval H&P Note (Signed)
History and Physical Interval Note:  10/17/2020 10:09 AM  [name]  has presented today for surgery, with the diagnosis of Cholelithiasis.  The various methods of treatment have been discussed with the patient and family. After consideration of risks, benefits and other options for treatment, the patient has consented to  Procedure(s): LAPAROSCOPIC CHOLECYSTECTOMY (N/A) as a surgical intervention.  The patient's history has been reviewed, patient examined, no change in status, stable for surgery.  I have reviewed the patient's chart and labs.  Questions were answered to the patient's satisfaction.    No changes. Asked urology about the cyst on kidney and they agree benign and no need for follow up with them. Patient also with perfusion abnormality in the liver that will need to be followed by MRI in the future but should not effect surgery. Kelsey Cooke

## 2020-10-20 ENCOUNTER — Encounter (HOSPITAL_COMMUNITY): Payer: Self-pay | Admitting: General Surgery

## 2020-10-20 LAB — SURGICAL PATHOLOGY

## 2020-10-22 DIAGNOSIS — B070 Plantar wart: Secondary | ICD-10-CM | POA: Diagnosis not present

## 2020-11-18 ENCOUNTER — Ambulatory Visit: Payer: BC Managed Care – PPO | Admitting: Gastroenterology

## 2021-02-25 ENCOUNTER — Other Ambulatory Visit: Payer: Self-pay | Admitting: *Deleted

## 2021-02-25 ENCOUNTER — Telehealth: Payer: Self-pay | Admitting: *Deleted

## 2021-02-25 DIAGNOSIS — R932 Abnormal findings on diagnostic imaging of liver and biliary tract: Secondary | ICD-10-CM

## 2021-02-25 NOTE — Telephone Encounter (Signed)
Repeat MRI abdomen with and without contrast ordered in Epic -await Pre certification and schedule.

## 2021-03-06 ENCOUNTER — Telehealth: Payer: Self-pay | Admitting: Family Medicine

## 2021-03-16 ENCOUNTER — Ambulatory Visit (HOSPITAL_COMMUNITY): Payer: BC Managed Care – PPO

## 2021-03-26 ENCOUNTER — Ambulatory Visit (HOSPITAL_COMMUNITY)
Admission: RE | Admit: 2021-03-26 | Discharge: 2021-03-26 | Disposition: A | Discharge status: Home / Self Care | Payer: BC Managed Care – PPO | Source: Ambulatory Visit | Attending: Family Medicine | Admitting: Family Medicine

## 2021-03-26 ENCOUNTER — Other Ambulatory Visit: Payer: Self-pay

## 2021-03-26 DIAGNOSIS — K769 Liver disease, unspecified: Secondary | ICD-10-CM | POA: Diagnosis not present

## 2021-03-26 DIAGNOSIS — Z9049 Acquired absence of other specified parts of digestive tract: Secondary | ICD-10-CM | POA: Diagnosis not present

## 2021-03-26 DIAGNOSIS — N281 Cyst of kidney, acquired: Secondary | ICD-10-CM | POA: Diagnosis not present

## 2021-03-26 DIAGNOSIS — R932 Abnormal findings on diagnostic imaging of liver and biliary tract: Secondary | ICD-10-CM | POA: Insufficient documentation

## 2021-03-26 MED ORDER — GADOBUTROL 1 MMOL/ML IV SOLN
7.0000 mL | Freq: Once | INTRAVENOUS | Status: AC | PRN
  Administered 2021-03-26: 7 mL via INTRAVENOUS

## 2021-06-04 ENCOUNTER — Other Ambulatory Visit: Payer: BC Managed Care – PPO | Admitting: Advanced Practice Midwife

## 2021-07-06 ENCOUNTER — Other Ambulatory Visit (HOSPITAL_COMMUNITY): Payer: Self-pay | Admitting: Adult Health

## 2021-07-06 DIAGNOSIS — Z1231 Encounter for screening mammogram for malignant neoplasm of breast: Secondary | ICD-10-CM

## 2021-07-22 ENCOUNTER — Other Ambulatory Visit: Payer: Self-pay

## 2021-07-22 ENCOUNTER — Ambulatory Visit (INDEPENDENT_AMBULATORY_CARE_PROVIDER_SITE_OTHER): Payer: BC Managed Care – PPO | Admitting: Adult Health

## 2021-07-22 ENCOUNTER — Encounter: Payer: Self-pay | Admitting: Adult Health

## 2021-07-22 VITALS — BP 146/89 | HR 64 | Ht 64.0 in | Wt 208.2 lb

## 2021-07-22 DIAGNOSIS — Z01419 Encounter for gynecological examination (general) (routine) without abnormal findings: Secondary | ICD-10-CM | POA: Diagnosis not present

## 2021-07-22 DIAGNOSIS — Z975 Presence of (intrauterine) contraceptive device: Secondary | ICD-10-CM | POA: Diagnosis not present

## 2021-07-22 DIAGNOSIS — I10 Essential (primary) hypertension: Secondary | ICD-10-CM

## 2021-07-22 DIAGNOSIS — Z1211 Encounter for screening for malignant neoplasm of colon: Secondary | ICD-10-CM | POA: Diagnosis not present

## 2021-07-22 DIAGNOSIS — K219 Gastro-esophageal reflux disease without esophagitis: Secondary | ICD-10-CM

## 2021-07-22 LAB — HEMOCCULT GUIAC POC 1CARD (OFFICE): Fecal Occult Blood, POC: NEGATIVE

## 2021-07-22 MED ORDER — HYDROCHLOROTHIAZIDE 12.5 MG PO CAPS: 12.5000 mg | ORAL_CAPSULE | Freq: Every day | ORAL | 4 refills | Status: DC

## 2021-07-22 MED ORDER — SCOPOLAMINE 1 MG/3DAYS TD PT72: 1.0000 | MEDICATED_PATCH | TRANSDERMAL | 0 refills | Status: DC

## 2021-07-22 MED ORDER — ESOMEPRAZOLE MAGNESIUM 40 MG PO CPDR: 40.0000 mg | DELAYED_RELEASE_CAPSULE | Freq: Every day | ORAL | 12 refills | Status: DC

## 2021-07-22 NOTE — Progress Notes (Signed)
Patient ID: Kelsey Cooke, female   DOB: 10-02-1970, 50 y.o.   MRN: [id number] History of Present Illness: Kelsey Cooke is a 50 year old white female, married, Kelsey Cooke in for well woman gyn exam. Lab Results  Component Value Date   DIAGPAP  03/07/2020    - Negative for intraepithelial lesion or malignancy (NILM)   HPV NOT DETECTED 11/16/2016   Kelsey Cooke Negative 03/07/2020   PCP is Dr Wolfgang Phoenix    Current Medications, Allergies, Past Medical History, Past Surgical History, Family History and Social History were reviewed in Kelsey Cooke record.     Review of Systems: Patient denies any headaches, hearing loss, fatigue, blurred vision, shortness of breath, chest pain, abdominal pain, problems with bowel movements, urination, or intercourse. No joint pain or mood swings.  Has had COVID She is having foot pain and is seeing podiatrist She has gained weight back she lost when doing [employer]  +stress at work Going on cruise in February request transderm scop patch No periods with IUD   Physical Exam:BP (!) 146/89 (BP Location: Left Arm, Patient Position: Sitting, Cuff Size: Large)   Pulse 64   Ht 5\' 4"  (1.626 m)   Wt 208 lb 3.2 oz (94.4 kg)   LMP  (LMP Unknown)   BMI 35.74 kg/m   General:  Well developed, well nourished, no acute distress Skin:  Warm and dry Neck:  Midline trachea, normal thyroid, good ROM, no lymphadenopathy Lungs; Clear to auscultation bilaterally Breast:  No dominant palpable mass, retraction, or nipple discharge Cardiovascular: Regular rate and rhythm Abdomen:  Soft, non tender, no hepatosplenomegaly Pelvic:  External genitalia is normal in appearance, no lesions.  The vagina is normal in appearance. Urethra has no lesions or masses. The cervix is bulbous.  Uterus is felt to be normal size, shape, and contour.  No adnexal masses or tenderness noted.Bladder is non tender, no masses felt. Rectal: Good sphincter tone, no polyps, or hemorrhoids felt.   Hemoccult negative. Extremities/musculoskeletal:  No swelling or varicosities noted, no clubbing or cyanosis Psych:  No mood changes, alert and cooperative,seems happy AA is 1 Fall risk is low Depression screen Kelsey Cooke 2/9 07/22/2021 09/02/2020 03/07/2020  Decreased Interest 0 0 0  Down, Depressed, Hopeless 0 0 0  PHQ - 2 Score 0 0 0  Altered sleeping 2 - 1  Tired, decreased energy 3 - 0  Change in appetite 0 - 0  Feeling bad or failure about yourself  0 - 0  Trouble concentrating 2 - 0  Moving slowly or fidgety/restless 0 - 0  Suicidal thoughts 0 - 0  PHQ-9 Score 7 - 1  Difficult doing work/chores - - Not difficult at all   Declines meds  GAD 7 : Generalized Anxiety Score 07/22/2021 03/07/2020  Nervous, Anxious, on Edge 2 1  Control/stop worrying 1 0  Worry too much - different things 1 1  Trouble relaxing 1 0  Restless 1 0  Easily annoyed or irritable 1 0  Afraid - awful might happen 0 0  Total GAD 7 Score 7 2  Anxiety Difficulty - Not difficult at all    Upstream - 07/22/21 1045       Pregnancy Intention Screening   Does the patient want to become pregnant in the next year? No    Does the patient's partner want to become pregnant in the next year? No    Would the patient like to discuss contraceptive options today? No      Contraception Wrap  Up   Current Method IUD or IUS;Vasectomy    End Method IUD or IUS;Vasectomy    Contraception Counseling Provided No              Co exam with Kelsey Luz NP student   Impression and Plan:  1. Encounter for well woman exam with routine gynecological exam Physical in 1 year Pap 2024 Bring copy labs from work Mammogram in January and yearly Colonoscopy per GI Will rx transderm scop patch for cruise in February   2. Encounter for screening fecal occult blood testing   3. Primary hypertension Refilled Microzide Meds ordered this encounter  Medications   hydrochlorothiazide (MICROZIDE) 12.5 MG capsule    Sig: Take 1  capsule (12.5 mg total) by mouth daily.    Dispense:  90 capsule    Refill:  4    Please consider 90 day supplies to promote better adherence    Order Specific Question:   Supervising Provider    Answer:   Tania Ade H [[id number]]   scopolamine (TRANSDERM SCOP, 1.5 MG,) 1 MG/3DAYS    Sig: Place 1 patch (1.5 mg total) onto the skin every 3 (three) days.    Dispense:  4 patch    Refill:  0    Order Specific Question:   Supervising Provider    Answer:   Tania Ade H [[id number]]   esomeprazole (NEXIUM) 40 MG capsule    Sig: Take 1 capsule (40 mg total) by mouth daily.    Dispense:  30 capsule    Refill:  12    Order Specific Question:   Supervising Provider    Answer:   Elonda Husky, LUTHER H [[id number]]     4. IUD (intrauterine device) in place Placed 12/23/16  5. Gastroesophageal reflux disease without esophagitis Refilled nexium was out and used [employer]

## 2021-08-03 DIAGNOSIS — M722 Plantar fascial fibromatosis: Secondary | ICD-10-CM | POA: Diagnosis not present

## 2021-08-21 DIAGNOSIS — M722 Plantar fascial fibromatosis: Secondary | ICD-10-CM | POA: Diagnosis not present

## 2021-09-04 DIAGNOSIS — M722 Plantar fascial fibromatosis: Secondary | ICD-10-CM | POA: Diagnosis not present

## 2021-09-07 ENCOUNTER — Ambulatory Visit (HOSPITAL_COMMUNITY): Payer: BC Managed Care – PPO

## 2021-10-19 DIAGNOSIS — H40003 Preglaucoma, unspecified, bilateral: Secondary | ICD-10-CM | POA: Diagnosis not present

## 2021-10-29 ENCOUNTER — Other Ambulatory Visit: Payer: Self-pay

## 2021-10-29 ENCOUNTER — Ambulatory Visit: Payer: BC Managed Care – PPO | Admitting: Family Medicine

## 2021-10-29 DIAGNOSIS — F4320 Adjustment disorder, unspecified: Secondary | ICD-10-CM | POA: Insufficient documentation

## 2021-10-29 DIAGNOSIS — F4323 Adjustment disorder with mixed anxiety and depressed mood: Secondary | ICD-10-CM

## 2021-10-29 MED ORDER — CITALOPRAM HYDROBROMIDE 10 MG PO TABS
10.0000 mg | ORAL_TABLET | Freq: Every day | ORAL | 0 refills | Status: DC
Start: 2021-10-29 — End: 2022-02-16

## 2021-10-29 NOTE — Progress Notes (Signed)
? ?Subjective:  ?Patient ID: Kelsey Cooke, female    DOB: [birth year]  Age: 51 y.o. MRN: [id number] ? ?CC: ?Chief Complaint  ?Patient presents with  ? trouble focusing  ? ? ?HPI: ? ?51 year old female presents for evaluation of the above. ? ?Patient states that she is very stressed out.  She is overwhelmed at work.  She states that there has been a Hydrologist at work.  She states that she is overworked and is having difficulty completing her tasks and focusing.  She states that she is trying to cope but is having difficulty.  She is now having difficulty sleeping and is having other symptoms.  GAD-7 score of 13.  PHQ-9 score of 11. ? ?Patient Active Problem List  ? Diagnosis Date Noted  ? Adjustment disorder 10/29/2021  ? Essential hypertension 03/08/2018  ? Encounter for IUD insertion 12/24/2016  ? Fibroid 02/06/2016  ? Esophageal reflux 07/21/2015  ? Rectocele 11/12/2014  ? Hemorrhoids 11/12/2014  ? ? ?Social Hx   ?Social History  ? ?Socioeconomic History  ? Marital status: Married  ?  Spouse name: Not on file  ? Number of children: Not on file  ? Years of education: Not on file  ? Highest education level: Not on file  ?Occupational History  ? Not on file  ?Tobacco Use  ? Smoking status: Former  ?  Types: Cigarettes  ? Smokeless tobacco: Never  ?Vaping Use  ? Vaping Use: Never used  ?Substance and Sexual Activity  ? Alcohol use: No  ? Drug use: No  ? Sexual activity: Yes  ?  Birth control/protection: Surgical, I.U.D.  ?  Comment: vasectomy  ?Other Topics Concern  ? Not on file  ?Social History Narrative  ? Not on file  ? ?Social Determinants of Health  ? ?Financial Resource Strain: Low Risk   ? Difficulty of Paying Living Expenses: Not hard at all  ?Food Insecurity: No Food Insecurity  ? Worried About Charity fundraiser in the Last Year: Never true  ? Ran Out of Food in the Last Year: Never true  ?Transportation Needs: No Transportation Needs  ? Lack of Transportation (Medical): No  ? Lack of Transportation  (Non-Medical): No  ?Physical Activity: Insufficiently Active  ? Days of Exercise per Week: 3 days  ? Minutes of Exercise per Session: 20 min  ?Stress: Stress Concern Present  ? Feeling of Stress : Rather much  ?Social Connections: Socially Integrated  ? Frequency of Communication with Friends and Family: Three times a week  ? Frequency of Social Gatherings with Friends and Family: Twice a week  ? Attends Religious Services: More than 4 times per year  ? Active Member of Clubs or Organizations: Yes  ? Attends Archivist Meetings: More than 4 times per year  ? Marital Status: Married  ? ? ?Review of Systems ?Per HPI ? ?Objective:  ?BP (!) 146/85   Pulse 78   Temp 98.6 ?F (37 ?C)   Ht 5\' 4"  (1.626 m)   Wt 202 lb (91.6 kg)   SpO2 97%   BMI 34.67 kg/m?  ? ?BP/Weight 10/29/2021 07/22/2021 10/17/2020  ?Systolic BP 195 093 267  ?Diastolic BP 85 89 83  ?Wt. (Lbs) 202 208.2 -  ?BMI 34.67 35.74 -  ? ? ?Physical Exam ?Constitutional:   ?   General: She is not in acute distress. ?   Appearance: Normal appearance. She is obese.  ?Cardiovascular:  ?   Rate and Rhythm: Normal rate and regular  rhythm.  ?Pulmonary:  ?   Effort: Pulmonary effort is normal.  ?   Breath sounds: Normal breath sounds.  ?Neurological:  ?   Mental Status: She is alert.  ?Psychiatric:  ?   Comments: Very anxious.  Psychomotor agitation noted.  ? ? ?Lab Results  ?Component Value Date  ? WBC 6.6 08/30/2020  ? HGB 14.0 08/30/2020  ? HCT 47.0 (H) 08/30/2020  ? PLT 247 08/30/2020  ? GLUCOSE 95 10/16/2020  ? CHOL 165 03/08/2018  ? TRIG 90 03/08/2018  ? HDL 43 03/08/2018  ? LDLCALC 104 (H) 03/08/2018  ? ALT 25 08/30/2020  ? AST 22 08/30/2020  ? NA 137 10/16/2020  ? K 3.8 10/16/2020  ? CL 102 10/16/2020  ? CREATININE 0.56 10/16/2020  ? BUN 16 10/16/2020  ? CO2 27 10/16/2020  ? TSH 2.300 03/08/2018  ? ? ? ?Assessment & Plan:  ? ?Problem List Items Addressed This Visit   ? ?  ? Other  ? Adjustment disorder  ?  Patient with increasing stress at work.  She  is overwhelmed.  This is consistent with adjustment reaction with features of depression and anxiety.  Treating with Celexa.  Referring to counseling. ?  ?  ? Relevant Orders  ? Ambulatory referral to Psychology  ? ? ?Meds ordered this encounter  ?Medications  ? citalopram (CELEXA) 10 MG tablet  ?  Sig: Take 1 tablet (10 mg total) by mouth daily.  ?  Dispense:  90 tablet  ?  Refill:  0  ? ? ?Follow-up:  Return in about 1 month (around 11/29/2021). ? ?Thersa Salt DO ?[place] ? ?

## 2021-10-29 NOTE — Assessment & Plan Note (Addendum)
Patient with increasing stress at work.  She is overwhelmed.  This is consistent with adjustment reaction with features of depression and anxiety.  Treating with Celexa.  Referring to counseling. ?

## 2021-10-29 NOTE — Patient Instructions (Signed)
Medication as prescribed. ? ?Follow up in ~ 1 month. ? ?Take care ? ?Dr. Lacinda Axon  ?

## 2021-10-30 ENCOUNTER — Other Ambulatory Visit: Payer: Self-pay | Admitting: Family Medicine

## 2021-10-30 DIAGNOSIS — F4323 Adjustment disorder with mixed anxiety and depressed mood: Secondary | ICD-10-CM

## 2021-11-27 ENCOUNTER — Telehealth: Payer: Self-pay | Admitting: Family Medicine

## 2021-11-27 ENCOUNTER — Other Ambulatory Visit: Payer: Self-pay

## 2021-11-27 NOTE — Telephone Encounter (Signed)
Pt had to cancel appt for Monday due to quitting job and not having any insurance. Pt will not have any insurance for 30 days. Pt is wanting to know how to wean off of Celexa or does she need to wean off Celexa. Pt states she is leaving a stressful job and doesn't think she will need it. Please advise. Thank you ?

## 2021-11-30 ENCOUNTER — Ambulatory Visit: Payer: BC Managed Care – PPO | Admitting: Family Medicine

## 2021-11-30 NOTE — Telephone Encounter (Signed)
Patient notified of Dr Jonathon Jordan recommendations and verbalized understanding. ?

## 2021-11-30 NOTE — Telephone Encounter (Signed)
Left message to return call 

## 2021-11-30 NOTE — Telephone Encounter (Signed)
Coral Spikes, DO  ? ?Recommend 10 mg x 1 week, then 10 mg every other day x 1 week, then stopping.   ? ?

## 2022-02-16 ENCOUNTER — Ambulatory Visit (INDEPENDENT_AMBULATORY_CARE_PROVIDER_SITE_OTHER): Payer: BC Managed Care – PPO | Admitting: Family Medicine

## 2022-02-16 VITALS — BP 138/90 | HR 97 | Temp 98.4°F | Ht 64.0 in | Wt 218.0 lb

## 2022-02-16 DIAGNOSIS — F988 Other specified behavioral and emotional disorders with onset usually occurring in childhood and adolescence: Secondary | ICD-10-CM | POA: Diagnosis not present

## 2022-02-16 DIAGNOSIS — K219 Gastro-esophageal reflux disease without esophagitis: Secondary | ICD-10-CM

## 2022-02-16 DIAGNOSIS — I10 Essential (primary) hypertension: Secondary | ICD-10-CM | POA: Diagnosis not present

## 2022-02-16 MED ORDER — HYDROCHLOROTHIAZIDE 12.5 MG PO CAPS: 12.5000 mg | ORAL_CAPSULE | Freq: Every day | ORAL | 3 refills | Status: DC

## 2022-02-16 MED ORDER — RESTASIS 0.05 % OP EMUL: 1.0000 [drp] | Freq: Two times a day (BID) | OPHTHALMIC | 3 refills | Status: DC | PRN

## 2022-02-16 MED ORDER — ATOMOXETINE HCL 40 MG PO CAPS: 40.0000 mg | ORAL_CAPSULE | Freq: Every day | ORAL | 1 refills | Status: DC

## 2022-02-16 MED ORDER — ESOMEPRAZOLE MAGNESIUM 40 MG PO CPDR: 40.0000 mg | DELAYED_RELEASE_CAPSULE | Freq: Every day | ORAL | 3 refills | Status: DC

## 2022-02-16 NOTE — Progress Notes (Signed)
Subjective:  Patient ID: Kelsey Cooke, female    DOB: 09/16/1970  Age: 51 y.o. MRN: [id number]  CC: Chief Complaint  Patient presents with   focus concerns    Medication Refill    HPI:  51 year old female presents for evaluation of the above.  Patient's hypertension is stable on HCTZ.  Needs refill.  Patient reports that her stress levels have improved after she quit her job.  She still has difficulty focusing and staying on task.  She has trouble with recall and remembering certain things.  She states that this has been an issue since she was very young.  She believes that she has attention deficit and would like to discuss treatment options.  GERD is stable on Nexium.  Needs refill.  Patient Active Problem List   Diagnosis Date Noted   Attention deficit disorder (ADD) in adult 02/16/2022   GERD (gastroesophageal reflux disease) 02/16/2022   Essential hypertension 03/08/2018   Encounter for IUD insertion 12/24/2016   Fibroid 02/06/2016   Hemorrhoids 11/12/2014    Social Hx   Social History   Socioeconomic History   Marital status: Married    Spouse name: Not on file   Number of children: Not on file   Years of education: Not on file   Highest education level: Not on file  Occupational History   Not on file  Tobacco Use   Smoking status: Former    Types: Cigarettes   Smokeless tobacco: Never  Vaping Use   Vaping Use: Never used  Substance and Sexual Activity   Alcohol use: No   Drug use: No   Sexual activity: Yes    Birth control/protection: Surgical, I.U.D.    Comment: vasectomy  Other Topics Concern   Not on file  Social History Narrative   Not on file   Social Determinants of Health   Financial Resource Strain: Low Risk  (07/22/2021)   Overall Financial Resource Strain (CARDIA)    Difficulty of Paying Living Expenses: Not hard at all  Food Insecurity: No Food Insecurity (07/22/2021)   Hunger Vital Sign    Worried About Running Out of Food in  the Last Year: Never true    Ran Out of Food in the Last Year: Never true  Transportation Needs: No Transportation Needs (07/22/2021)   PRAPARE - Hydrologist (Medical): No    Lack of Transportation (Non-Medical): No  Physical Activity: Insufficiently Active (07/22/2021)   Exercise Vital Sign    Days of Exercise per Week: 3 days    Minutes of Exercise per Session: 20 min  Stress: Stress Concern Present (07/22/2021)   [place]    Feeling of Stress : Rather much  Social Connections: Socially Integrated (07/22/2021)   Social Connection and Isolation Panel [NHANES]    Frequency of Communication with Friends and Family: Three times a week    Frequency of Social Gatherings with Friends and Family: Twice a week    Attends Religious Services: More than 4 times per year    Active Member of Clubs or Organizations: Yes    Attends Music therapist: More than 4 times per year    Marital Status: Married    Review of Systems Per HPI  Objective:  BP 138/90   Pulse 97   Temp 98.4 F (36.9 C)   Ht '5\' 4"'$  (1.626 m)   Wt 218 lb (98.9 kg)   SpO2 98%  BMI 37.42 kg/m      02/16/2022    1:52 PM 10/29/2021    2:56 PM 07/22/2021   10:46 AM  BP/Weight  Systolic BP 790 240 973  Diastolic BP 90 85 89  Wt. (Lbs) 218 202 208.2  BMI 37.42 kg/m2 34.67 kg/m2 35.74 kg/m2    Physical Exam Vitals and nursing note reviewed.  Constitutional:      General: She is not in acute distress.    Appearance: She is obese.  HENT:     Head: Normocephalic and atraumatic.  Eyes:     General:        Right eye: No discharge.        Left eye: No discharge.     Conjunctiva/sclera: Conjunctivae normal.  Cardiovascular:     Rate and Rhythm: Normal rate and regular rhythm.  Pulmonary:     Effort: Pulmonary effort is normal.     Breath sounds: Normal breath sounds.  Neurological:     Mental Status: She is  alert.  Psychiatric:        Mood and Affect: Mood normal.        Behavior: Behavior normal.     Lab Results  Component Value Date   WBC 6.6 08/30/2020   HGB 14.0 08/30/2020   HCT 47.0 (H) 08/30/2020   PLT 247 08/30/2020   GLUCOSE 95 10/16/2020   CHOL 165 03/08/2018   TRIG 90 03/08/2018   HDL 43 03/08/2018   LDLCALC 104 (H) 03/08/2018   ALT 25 08/30/2020   AST 22 08/30/2020   NA 137 10/16/2020   K 3.8 10/16/2020   CL 102 10/16/2020   CREATININE 0.56 10/16/2020   BUN 16 10/16/2020   CO2 27 10/16/2020   TSH 2.300 03/08/2018     Assessment & Plan:   Problem List Items Addressed This Visit       Cardiovascular and Mediastinum   Essential hypertension    Stable.  Continue HCTZ.  Refilled today.      Relevant Medications   hydrochlorothiazide (MICROZIDE) 12.5 MG capsule     Digestive   GERD (gastroesophageal reflux disease)    Stable.  Continue Nexium.      Relevant Medications   esomeprazole (NEXIUM) 40 MG capsule     Other   Attention deficit disorder (ADD) in adult - Primary    Patient's primary difficulty is inattention and difficulty with focus and recall. Referring to behavioral health for formal evaluation.  Trial of Strattera.  Avoiding stimulant medication due to patient's underlying hypertension.      Relevant Orders   Ambulatory referral to Psychiatry    Meds ordered this encounter  Medications   RESTASIS 0.05 % ophthalmic emulsion    Sig: Place 1 drop into both eyes 2 (two) times daily as needed (dry/irritated eyes.).    Dispense:  0.4 mL    Refill:  3   esomeprazole (NEXIUM) 40 MG capsule    Sig: Take 1 capsule (40 mg total) by mouth daily.    Dispense:  90 capsule    Refill:  3   hydrochlorothiazide (MICROZIDE) 12.5 MG capsule    Sig: Take 1 capsule (12.5 mg total) by mouth daily.    Dispense:  90 capsule    Refill:  3    Please consider 90 day supplies to promote better adherence   atomoxetine (STRATTERA) 40 MG capsule    Sig: Take  1 capsule (40 mg total) by mouth daily.    Dispense:  90  capsule    Refill:  1    Follow-up:  3-6 months  [place]

## 2022-02-16 NOTE — Assessment & Plan Note (Signed)
Stable.  Continue Nexium. 

## 2022-02-16 NOTE — Assessment & Plan Note (Signed)
Stable.  Continue HCTZ.  Refilled today.

## 2022-02-16 NOTE — Assessment & Plan Note (Signed)
Patient's primary difficulty is inattention and difficulty with focus and recall. Referring to behavioral health for formal evaluation.  Trial of Strattera.  Avoiding stimulant medication due to patient's underlying hypertension.

## 2022-02-16 NOTE — Patient Instructions (Signed)
Medication as prescribed.  Referral placed.  Follow up in 3-6 months.  Take care  Dr. Lacinda Axon

## 2022-05-17 ENCOUNTER — Telehealth: Payer: Self-pay

## 2022-05-17 NOTE — Telephone Encounter (Signed)
Coral Spikes, DO     [facility]

## 2022-05-17 NOTE — Telephone Encounter (Signed)
Caller name:Lavender Jodell Cipro  On DPR? :No  Call back number:[phone]  Provider they see: Lacinda Axon  Reason for call:Pt is calling asking for a name on her referral and I am not seeing in the system a referral been completed

## 2022-05-17 NOTE — Telephone Encounter (Signed)
Patient notified and verbalized understanding. 

## 2022-05-19 ENCOUNTER — Ambulatory Visit: Payer: BC Managed Care – PPO | Admitting: Family Medicine

## 2022-07-14 DIAGNOSIS — M6702 Short Achilles tendon (acquired), left ankle: Secondary | ICD-10-CM | POA: Diagnosis not present

## 2022-07-14 DIAGNOSIS — M722 Plantar fascial fibromatosis: Secondary | ICD-10-CM | POA: Diagnosis not present

## 2022-07-21 ENCOUNTER — Other Ambulatory Visit: Payer: BC Managed Care – PPO

## 2022-07-21 DIAGNOSIS — Z131 Encounter for screening for diabetes mellitus: Secondary | ICD-10-CM

## 2022-07-21 DIAGNOSIS — E7800 Pure hypercholesterolemia, unspecified: Secondary | ICD-10-CM

## 2022-07-21 DIAGNOSIS — Z01419 Encounter for gynecological examination (general) (routine) without abnormal findings: Secondary | ICD-10-CM

## 2022-07-21 DIAGNOSIS — Z8639 Personal history of other endocrine, nutritional and metabolic disease: Secondary | ICD-10-CM | POA: Diagnosis not present

## 2022-07-22 LAB — CBC
Hematocrit: 42.3 % (ref 34.0–46.6)
Hemoglobin: 13.4 g/dL (ref 11.1–15.9)
MCH: 25.2 pg — ABNORMAL LOW (ref 26.6–33.0)
MCHC: 31.7 g/dL (ref 31.5–35.7)
MCV: 80 fL (ref 79–97)
Platelets: 271 10*3/uL (ref 150–450)
RBC: 5.32 x10E6/uL — ABNORMAL HIGH (ref 3.77–5.28)
RDW: 13.5 % (ref 11.7–15.4)
WBC: 6 10*3/uL (ref 3.4–10.8)

## 2022-07-22 LAB — COMPREHENSIVE METABOLIC PANEL
ALT: 20 IU/L (ref 0–32)
AST: 23 IU/L (ref 0–40)
Albumin/Globulin Ratio: 1.6 (ref 1.2–2.2)
Albumin: 4.3 g/dL (ref 3.8–4.9)
Alkaline Phosphatase: 100 IU/L (ref 44–121)
BUN/Creatinine Ratio: 8 — ABNORMAL LOW (ref 9–23)
BUN: 7 mg/dL (ref 6–24)
Bilirubin Total: 0.6 mg/dL (ref 0.0–1.2)
CO2: 24 mmol/L (ref 20–29)
Calcium: 9.6 mg/dL (ref 8.7–10.2)
Chloride: 102 mmol/L (ref 96–106)
Creatinine, Ser: 0.83 mg/dL (ref 0.57–1.00)
Globulin, Total: 2.7 g/dL (ref 1.5–4.5)
Glucose: 100 mg/dL — ABNORMAL HIGH (ref 70–99)
Potassium: 4 mmol/L (ref 3.5–5.2)
Sodium: 146 mmol/L — ABNORMAL HIGH (ref 134–144)
Total Protein: 7 g/dL (ref 6.0–8.5)
eGFR: 85 mL/min/{1.73_m2} (ref >59)

## 2022-07-22 LAB — LIPID PANEL
Chol/HDL Ratio: 3.4 ratio (ref 0.0–4.4)
Cholesterol, Total: 164 mg/dL (ref 100–199)
HDL: 48 mg/dL (ref >39)
LDL Chol Calc (NIH): 103 mg/dL — ABNORMAL HIGH (ref 0–99)
Triglycerides: 69 mg/dL (ref 0–149)
VLDL Cholesterol Cal: 13 mg/dL (ref 5–40)

## 2022-07-22 LAB — HEMOGLOBIN A1C
Est. average glucose Bld gHb Est-mCnc: 117 mg/dL
Hgb A1c MFr Bld: 5.7 % — ABNORMAL HIGH (ref 4.8–5.6)

## 2022-07-22 LAB — TSH: TSH: 2.2 u[IU]/mL (ref 0.450–4.500)

## 2022-07-26 ENCOUNTER — Encounter: Payer: Self-pay | Admitting: Adult Health

## 2022-07-26 ENCOUNTER — Ambulatory Visit (INDEPENDENT_AMBULATORY_CARE_PROVIDER_SITE_OTHER): Payer: BC Managed Care – PPO | Admitting: Adult Health

## 2022-07-26 VITALS — BP 149/92 | HR 72 | Ht 64.5 in | Wt 219.0 lb

## 2022-07-26 DIAGNOSIS — Z01419 Encounter for gynecological examination (general) (routine) without abnormal findings: Secondary | ICD-10-CM | POA: Diagnosis not present

## 2022-07-26 DIAGNOSIS — Z1211 Encounter for screening for malignant neoplasm of colon: Secondary | ICD-10-CM | POA: Diagnosis not present

## 2022-07-26 DIAGNOSIS — Z975 Presence of (intrauterine) contraceptive device: Secondary | ICD-10-CM | POA: Diagnosis not present

## 2022-07-26 LAB — HEMOCCULT GUIAC POC 1CARD (OFFICE): Fecal Occult Blood, POC: NEGATIVE

## 2022-07-26 NOTE — Progress Notes (Signed)
Patient ID: Kelsey Cooke, female   DOB: 28-Feb-1971, 51 y.o.   MRN: [id number] History of Present Illness: Kelsey Cooke is a 51 year old white female, married, G3P3 in for a well woman gyn exam, and review labs. She has changed jobs, less stress, is at [employer] now.  Last pap was negative HPV and malignancy 03/07/20.  PCP is Dr Lacinda Axon.  Current Medications, Allergies, Past Medical History, Past Surgical History, Family History and Social History were reviewed in [employer] record.     Review of Systems: Patient denies any headaches, hearing loss, fatigue, blurred vision, shortness of breath, chest pain, abdominal pain, problems with bowel movements, urination, or intercourse. No joint pain or mood swings.  Has trouble concentrating at times, or sitting still to read.    Physical Exam:BP (!) 149/92 (BP Location: Right Arm, Patient Position: Sitting, Cuff Size: Normal)   Pulse 72   Ht 5' 4.5" (1.638 m)   Wt 219 lb (99.3 kg)   BMI 37.01 kg/m  waist circumference 37 inches  General:  Well developed, well nourished, no acute distress Skin:  Warm and dry Neck:  Midline trachea, normal thyroid, good ROM, no lymphadenopathy Lungs; Clear to auscultation bilaterally Breast:  No dominant palpable mass, retraction, or nipple discharge Cardiovascular: Regular rate and rhythm Abdomen:  Soft, non tender, no hepatosplenomegaly Pelvic:  External genitalia is normal in appearance, no lesions.  The vagina is normal in appearance. Urethra has no lesions or masses. The cervix is bulbous.+IUD strings at os.  Uterus is felt to be normal size, shape, and contour.  No adnexal masses or tenderness noted.Bladder is non tender, no masses felt. Rectal: Good sphincter tone, no polyps, or hemorrhoids felt.  Hemoccult negative. Extremities/musculoskeletal:  No swelling or varicosities noted, no clubbing or cyanosis Psych:  No mood changes, alert and cooperative,seems happy Reviewed labs with pt  and filled out form for work  AA is 0 Fall risk is moderate    07/26/2022    8:43 AM 07/22/2021   10:45 AM 09/02/2020    1:40 PM  Depression screen PHQ 2/9  Decreased Interest 0 0 0  Down, Depressed, Hopeless 0 0 0  PHQ - 2 Score 0 0 0  Altered sleeping 2 2   Tired, decreased energy 3 3   Change in appetite 1 0   Feeling bad or failure about yourself  0 0   Trouble concentrating 1 2   Moving slowly or fidgety/restless 0 0   Suicidal thoughts 0 0   PHQ-9 Score 7 7        07/26/2022    8:43 AM 07/22/2021   10:46 AM 03/07/2020    1:04 PM  GAD 7 : Generalized Anxiety Score  Nervous, Anxious, on Edge '1 2 1  '$ Control/stop worrying 1 1 0  Worry too much - different things '1 1 1  '$ Trouble relaxing 1 1 0  Restless 0 1 0  Easily annoyed or irritable 1 1 0  Afraid - awful might happen 0 0 0  Total GAD 7 Score '5 7 2  '$ Anxiety Difficulty   Not difficult at all      Upstream - 07/26/22 4268       Pregnancy Intention Screening   Does the patient want to become pregnant in the next year? No    Does the patient's partner want to become pregnant in the next year? No    Would the patient like to discuss contraceptive options today? No  Contraception Wrap Up   Current Method IUD or IUS;Vasectomy    End Method IUD or IUS;Vasectomy    Contraception Counseling Provided No            Examination chaperoned by Levy Pupa LPN   Impression and Plan: 1. Encounter for well woman exam with routine gynecological exam Pap and physical in 1 year Mammogram was negative 09/04/20 Colonoscopy per GI  2. Encounter for screening fecal occult blood testing Hemoccult was negative   3. IUD (intrauterine device) in place Placed 12/23/16.

## 2022-07-30 DIAGNOSIS — M722 Plantar fascial fibromatosis: Secondary | ICD-10-CM | POA: Diagnosis not present

## 2022-09-06 ENCOUNTER — Telehealth (INDEPENDENT_AMBULATORY_CARE_PROVIDER_SITE_OTHER): Payer: BC Managed Care – PPO | Admitting: Family Medicine

## 2022-09-06 VITALS — Wt 219.0 lb

## 2022-09-06 DIAGNOSIS — J019 Acute sinusitis, unspecified: Secondary | ICD-10-CM

## 2022-09-06 MED ORDER — AMOXICILLIN-POT CLAVULANATE 875-125 MG PO TABS: 1.0000 | ORAL_TABLET | Freq: Two times a day (BID) | ORAL | 0 refills | Status: DC

## 2022-09-06 NOTE — Progress Notes (Addendum)
Virtual Visit via Video Note  I connected with Kelsey Cooke on 09/06/22 at  3:30 PM EST by a video enabled telemedicine application and verified that I am speaking with the correct person using two identifiers.  Location: Patient: Work Provider: Office   I discussed the limitations of evaluation and management by telemedicine and the availability of in person appointments. The patient expressed understanding and agreed to proceed.  History of Present Illness: 52 year old female presents for evaluation of respiratory symptoms.  Patient reports that she has had symptoms for the past 2 and half weeks.  She reports sinus pressure and congestion.  Associated facial pain.  She states that over the past few days she has had discolored/green nasal mucus.  She has had COVID testing which has been negative.  No relieving factors.  No fever.   Observations/Objective: Well-appearing on video. Speaking in full sentences.  No apparent distress.  Assessment and Plan: Acute rhinosinusitis -treating with Augmentin.  Follow Up Instructions:    I discussed the assessment and treatment plan with the patient. The patient was provided an opportunity to ask questions and all were answered. The patient agreed with the plan and demonstrated an understanding of the instructions.   The patient was advised to call back or seek an in-person evaluation if the symptoms worsen or if the condition fails to improve as anticipated.  I spent a total of 10 minutes during this encounter (video visit).  Coral Spikes, DO

## 2022-09-20 ENCOUNTER — Other Ambulatory Visit: Payer: Self-pay | Admitting: Family Medicine

## 2022-09-20 ENCOUNTER — Telehealth: Payer: Self-pay

## 2022-09-20 MED ORDER — FLUCONAZOLE 150 MG PO TABS: 150.0000 mg | ORAL_TABLET | Freq: Once | ORAL | 0 refills | Status: AC

## 2022-09-20 MED ORDER — DOXYCYCLINE HYCLATE 100 MG PO TABS: 100.0000 mg | ORAL_TABLET | Freq: Two times a day (BID) | ORAL | 0 refills | Status: DC

## 2022-09-20 NOTE — Telephone Encounter (Signed)
Patient called and was seen 09/06/2022 she states that she finished her antibiotic three days ago. Symptoms are a little better but she is still having pressure in cheeks, still congested, having drainage down her throat and now has yeast infection. Patient is requesting another antibiotic and something for a yeast infection.

## 2022-10-29 ENCOUNTER — Other Ambulatory Visit: Payer: Self-pay | Admitting: Family Medicine

## 2022-10-29 DIAGNOSIS — Z1231 Encounter for screening mammogram for malignant neoplasm of breast: Secondary | ICD-10-CM

## 2023-05-16 ENCOUNTER — Ambulatory Visit (INDEPENDENT_AMBULATORY_CARE_PROVIDER_SITE_OTHER): Payer: BC Managed Care – PPO | Admitting: Family Medicine

## 2023-05-16 VITALS — BP 152/84 | HR 74 | Temp 98.0°F | Ht 64.5 in | Wt 229.0 lb

## 2023-05-16 DIAGNOSIS — J988 Other specified respiratory disorders: Secondary | ICD-10-CM

## 2023-05-16 DIAGNOSIS — J029 Acute pharyngitis, unspecified: Secondary | ICD-10-CM | POA: Diagnosis not present

## 2023-05-16 LAB — POCT RAPID STREP A (OFFICE): Rapid Strep A Screen: NEGATIVE

## 2023-05-16 MED ORDER — HYDROCHLOROTHIAZIDE 12.5 MG PO CAPS: 12.5000 mg | ORAL_CAPSULE | Freq: Every day | ORAL | 3 refills | Status: DC

## 2023-05-16 MED ORDER — ESOMEPRAZOLE MAGNESIUM 40 MG PO CPDR: 40.0000 mg | DELAYED_RELEASE_CAPSULE | Freq: Every day | ORAL | 3 refills | Status: DC

## 2023-05-16 MED ORDER — AMOXICILLIN-POT CLAVULANATE 875-125 MG PO TABS: 1.0000 | ORAL_TABLET | Freq: Two times a day (BID) | ORAL | 0 refills | Status: DC

## 2023-05-16 NOTE — Assessment & Plan Note (Signed)
Strep testing negative.  Awaiting culture.  Patient declined COVID testing.  Advised to test at home.  There is a lot of COVID going around the community.  Empiric Augmentin while awaiting culture results.

## 2023-05-16 NOTE — Progress Notes (Signed)
Subjective:  Patient ID: Kelsey Cooke, female    DOB: 22-May-1971  Age: 52 y.o. MRN: [id number]  CC: Chief Complaint  Patient presents with   Nasal Congestion    Drainage and cough , ear pain , sore throat x 3 days   lymph node swelling     Neck arae     HPI:  52 year old female presents for evaluation of the above.  Patient reports that she has been sick for the past 3 days.  She reports sore throat, tenderness to the anterior neck, sinus congestion, ear pain, drainage, and cough.  No fever.  No reported sick contacts.  She has not taken a home COVID test.  No relieving factors.  Patient Active Problem List   Diagnosis Date Noted   Respiratory infection 05/16/2023   Encounter for screening fecal occult blood testing 07/26/2022   IUD (intrauterine device) in place 07/26/2022   Attention deficit disorder (ADD) in adult 02/16/2022   GERD (gastroesophageal reflux disease) 02/16/2022   Essential hypertension 03/08/2018   Fibroid 02/06/2016   Hemorrhoids 11/12/2014    Social Hx   Social History   Socioeconomic History   Marital status: Married    Spouse name: Not on file   Number of children: Not on file   Years of education: Not on file   Highest education level: Not on file  Occupational History   Not on file  Tobacco Use   Smoking status: Former    Types: Cigarettes   Smokeless tobacco: Never  Vaping Use   Vaping status: Never Used  Substance and Sexual Activity   Alcohol use: No   Drug use: No   Sexual activity: Yes    Birth control/protection: Surgical, I.U.D.    Comment: vasectomy  Other Topics Concern   Not on file  Social History Narrative   Not on file   Social Determinants of Health   Financial Resource Strain: Low Risk  (07/26/2022)   Overall Financial Resource Strain (CARDIA)    Difficulty of Paying Living Expenses: Not hard at all  Food Insecurity: No Food Insecurity (07/26/2022)   Hunger Vital Sign    Worried About Running Out of Food in  the Last Year: Never true    Ran Out of Food in the Last Year: Never true  Transportation Needs: No Transportation Needs (07/26/2022)   PRAPARE - Administrator, Civil Service (Medical): No    Lack of Transportation (Non-Medical): No  Physical Activity: Inactive (07/26/2022)   Exercise Vital Sign    Days of Exercise per Week: 0 days    Minutes of Exercise per Session: 0 min  Stress: No Stress Concern Present (07/26/2022)   [employer] of Occupational Health - Occupational Stress Questionnaire    Feeling of Stress : Not at all  Social Connections: Socially Integrated (07/26/2022)   Social Connection and Isolation Panel [NHANES]    Frequency of Communication with Friends and Family: Twice a week    Frequency of Social Gatherings with Friends and Family: Twice a week    Attends Religious Services: More than 4 times per year    Active Member of [employer] or Organizations: Yes    Attends Engineer, structural: More than 4 times per year    Marital Status: Married    Review of Systems Per HPI  Objective:  BP (!) 152/84   Pulse 74   Temp 98 F (36.7 C)   Ht 5' 4.5" (1.638 m)   Wt 229 lb (  103.9 kg)   SpO2 97%   BMI 38.70 kg/m      05/16/2023    4:02 PM 05/16/2023    3:38 PM 09/06/2022    3:24 PM  BP/Weight  Systolic BP 152 152   Diastolic BP 84 86   Wt. (Lbs)  229 219  BMI  38.7 kg/m2 37.01 kg/m2    Physical Exam Vitals and nursing note reviewed.  Constitutional:      General: She is not in acute distress.    Appearance: She is obese.  HENT:     Head: Normocephalic and atraumatic.     Right Ear: Tympanic membrane normal.     Left Ear: Tympanic membrane normal.     Nose: Congestion present.     Mouth/Throat:     Pharynx: Oropharynx is clear.  Eyes:     General:        Right eye: No discharge.        Left eye: No discharge.     Conjunctiva/sclera: Conjunctivae normal.  Cardiovascular:     Rate and Rhythm: Normal rate and regular rhythm.   Pulmonary:     Effort: Pulmonary effort is normal.     Breath sounds: Normal breath sounds. No wheezing or rales.  Neurological:     Mental Status: She is alert.     Lab Results  Component Value Date   WBC 6.0 07/21/2022   HGB 13.4 07/21/2022   HCT 42.3 07/21/2022   PLT 271 07/21/2022   GLUCOSE 100 (H) 07/21/2022   CHOL 164 07/21/2022   TRIG 69 07/21/2022   HDL 48 07/21/2022   LDLCALC 103 (H) 07/21/2022   ALT 20 07/21/2022   AST 23 07/21/2022   NA 146 (H) 07/21/2022   K 4.0 07/21/2022   CL 102 07/21/2022   CREATININE 0.83 07/21/2022   BUN 7 07/21/2022   CO2 24 07/21/2022   TSH 2.200 07/21/2022   HGBA1C 5.7 (H) 07/21/2022     Assessment & Plan:   Problem List Items Addressed This Visit       Respiratory   Respiratory infection - Primary    Strep testing negative.  Awaiting culture.  Patient declined COVID testing.  Advised to test at home.  There is a lot of COVID going around the community.  Empiric Augmentin while awaiting culture results.      Other Visit Diagnoses     Sore throat       Relevant Orders   POCT rapid strep A (Completed)   Culture, Group A Strep       Meds ordered this encounter  Medications   esomeprazole (NEXIUM) 40 MG capsule    Sig: Take 1 capsule (40 mg total) by mouth daily.    Dispense:  90 capsule    Refill:  3   hydrochlorothiazide (MICROZIDE) 12.5 MG capsule    Sig: Take 1 capsule (12.5 mg total) by mouth daily.    Dispense:  90 capsule    Refill:  3   amoxicillin-clavulanate (AUGMENTIN) 875-125 MG tablet    Sig: Take 1 tablet by mouth 2 (two) times daily.    Dispense:  14 tablet    Refill:  0    Follow-up:  Return if symptoms worsen or fail to improve.  Everlene Other DO [facility]

## 2023-05-16 NOTE — Patient Instructions (Signed)
Rest. Fluids.  Medication as directed.  Home COVID test.  Take care  Dr. Adriana Simas

## 2023-05-19 LAB — SPECIMEN STATUS REPORT

## 2023-05-19 LAB — CULTURE, GROUP A STREP: Strep A Culture: NEGATIVE

## 2023-07-18 ENCOUNTER — Encounter (HOSPITAL_COMMUNITY): Payer: Self-pay

## 2023-07-18 ENCOUNTER — Ambulatory Visit (HOSPITAL_COMMUNITY)
Admission: RE | Admit: 2023-07-18 | Discharge: 2023-07-18 | Disposition: A | Discharge status: Home / Self Care | Payer: BC Managed Care – PPO | Source: Ambulatory Visit | Attending: Family Medicine | Admitting: Family Medicine

## 2023-07-18 DIAGNOSIS — Z1231 Encounter for screening mammogram for malignant neoplasm of breast: Secondary | ICD-10-CM | POA: Diagnosis not present

## 2023-07-21 ENCOUNTER — Other Ambulatory Visit (HOSPITAL_COMMUNITY): Payer: Self-pay | Admitting: Family Medicine

## 2023-07-21 DIAGNOSIS — R928 Other abnormal and inconclusive findings on diagnostic imaging of breast: Secondary | ICD-10-CM

## 2023-07-26 ENCOUNTER — Ambulatory Visit (HOSPITAL_COMMUNITY)
Admission: RE | Admit: 2023-07-26 | Discharge: 2023-07-26 | Disposition: A | Discharge status: Home / Self Care | Payer: BC Managed Care – PPO | Source: Ambulatory Visit | Attending: Family Medicine | Admitting: Family Medicine

## 2023-07-26 DIAGNOSIS — R928 Other abnormal and inconclusive findings on diagnostic imaging of breast: Secondary | ICD-10-CM | POA: Insufficient documentation

## 2023-07-26 DIAGNOSIS — R92322 Mammographic fibroglandular density, left breast: Secondary | ICD-10-CM | POA: Diagnosis not present

## 2023-07-26 DIAGNOSIS — L720 Epidermal cyst: Secondary | ICD-10-CM | POA: Diagnosis not present

## 2023-08-09 ENCOUNTER — Ambulatory Visit: Payer: BC Managed Care – PPO | Admitting: Family Medicine

## 2023-08-09 VITALS — BP 132/82 | HR 71 | Temp 98.2°F | Ht 64.5 in | Wt 230.2 lb

## 2023-08-09 DIAGNOSIS — Z6838 Body mass index (BMI) 38.0-38.9, adult: Secondary | ICD-10-CM | POA: Diagnosis not present

## 2023-08-09 DIAGNOSIS — E66812 Obesity, class 2: Secondary | ICD-10-CM

## 2023-08-09 MED ORDER — SEMAGLUTIDE-WEIGHT MANAGEMENT 0.25 MG/0.5ML ~~LOC~~ SOAJ: 0.2500 mg | SUBCUTANEOUS | 0 refills | Status: DC

## 2023-08-09 NOTE — Assessment & Plan Note (Signed)
Discussed GLP-1 injectable medications, referral to the weight wellness, and bariatric surgery. Will see if we can get [facility] approved.  Referring to healthy weight and wellness.

## 2023-08-09 NOTE — Patient Instructions (Signed)
Referral placed.  Medication ordered (we will see if insurance approves).  Take care  Dr. Adriana Simas

## 2023-08-09 NOTE — Progress Notes (Signed)
Subjective:  Patient ID: Kelsey Cooke, female    DOB: November 05, 1970  Age: 52 y.o. MRN: [id number]  CC: Discussed weight loss  HPI:  52 year old female presents in regards to the above.  Patient states that she has been unsuccessful in her weight loss efforts.  She states that she lost some weight and then gained it back and then some.  She is interested in discussing weight loss treatment options today.  Patient Active Problem List   Diagnosis Date Noted   Class 2 obesity with body mass index (BMI) of 38.0 to 38.9 in adult 08/09/2023   IUD (intrauterine device) in place 07/26/2022   Attention deficit disorder (ADD) in adult 02/16/2022   GERD (gastroesophageal reflux disease) 02/16/2022   Essential hypertension 03/08/2018   Fibroid 02/06/2016   Hemorrhoids 11/12/2014    Social Hx   Social History   Socioeconomic History   Marital status: Married    Spouse name: Not on file   Number of children: Not on file   Years of education: Not on file   Highest education level: Not on file  Occupational History   Not on file  Tobacco Use   Smoking status: Former    Types: Cigarettes   Smokeless tobacco: Never  Vaping Use   Vaping status: Never Used  Substance and Sexual Activity   Alcohol use: No   Drug use: No   Sexual activity: Yes    Birth control/protection: Surgical, I.U.D.    Comment: vasectomy  Other Topics Concern   Not on file  Social History Narrative   Not on file   Social Determinants of Health   Financial Resource Strain: Low Risk  (07/26/2022)   Overall Financial Resource Strain (CARDIA)    Difficulty of Paying Living Expenses: Not hard at all  Food Insecurity: No Food Insecurity (07/26/2022)   Hunger Vital Sign    Worried About Running Out of Food in the Last Year: Never true    Ran Out of Food in the Last Year: Never true  Transportation Needs: No Transportation Needs (07/26/2022)   PRAPARE - Administrator, Civil Service (Medical): No     Lack of Transportation (Non-Medical): No  Physical Activity: Inactive (07/26/2022)   Exercise Vital Sign    Days of Exercise per Week: 0 days    Minutes of Exercise per Session: 0 min  Stress: No Stress Concern Present (07/26/2022)   [employer] of Occupational Health - Occupational Stress Questionnaire    Feeling of Stress : Not at all  Social Connections: Socially Integrated (07/26/2022)   Social Connection and Isolation Panel [NHANES]    Frequency of Communication with Friends and Family: Twice a week    Frequency of Social Gatherings with Friends and Family: Twice a week    Attends Religious Services: More than 4 times per year    Active Member of [employer] or Organizations: Yes    Attends Engineer, structural: More than 4 times per year    Marital Status: Married    Review of Systems Per HPI  Objective:  BP 132/82   Pulse 71   Temp 98.2 F (36.8 C)   Ht 5' 4.5" (1.638 m)   Wt 230 lb 3.2 oz (104.4 kg)   SpO2 98%   BMI 38.90 kg/m      08/09/2023   11:27 AM 08/09/2023   10:53 AM 05/16/2023    4:02 PM  BP/Weight  Systolic BP 132 144 152  Diastolic BP 82  78 84  Wt. (Lbs)  230.2   BMI  38.9 kg/m2     Physical Exam Vitals and nursing note reviewed.  Constitutional:      General: She is not in acute distress.    Appearance: She is obese.  HENT:     Head: Normocephalic and atraumatic.  Pulmonary:     Effort: Pulmonary effort is normal. No respiratory distress.  Neurological:     Mental Status: She is alert.  Psychiatric:        Mood and Affect: Mood normal.        Behavior: Behavior normal.     Lab Results  Component Value Date   WBC 6.0 07/21/2022   HGB 13.4 07/21/2022   HCT 42.3 07/21/2022   PLT 271 07/21/2022   GLUCOSE 100 (H) 07/21/2022   CHOL 164 07/21/2022   TRIG 69 07/21/2022   HDL 48 07/21/2022   LDLCALC 103 (H) 07/21/2022   ALT 20 07/21/2022   AST 23 07/21/2022   NA 146 (H) 07/21/2022   K 4.0 07/21/2022   CL 102 07/21/2022    CREATININE 0.83 07/21/2022   BUN 7 07/21/2022   CO2 24 07/21/2022   TSH 2.200 07/21/2022   HGBA1C 5.7 (H) 07/21/2022     Assessment & Plan:   Problem List Items Addressed This Visit       Other   Class 2 obesity with body mass index (BMI) of 38.0 to 38.9 in adult - Primary    Discussed GLP-1 injectable medications, referral to the weight wellness, and bariatric surgery. Will see if we can get [facility] approved.  Referring to healthy weight and wellness.      Relevant Medications   Semaglutide-Weight Management 0.25 MG/0.5ML SOAJ   Other Relevant Orders   Amb Ref to Medical Weight Management    Meds ordered this encounter  Medications   Semaglutide-Weight Management 0.25 MG/0.5ML SOAJ    Sig: Inject 0.25 mg into the skin once a week.    Dispense:  2 mL    Refill:  0   Karrington Mccravy DO [facility]

## 2023-08-10 ENCOUNTER — Telehealth: Payer: Self-pay

## 2023-08-10 NOTE — Telephone Encounter (Signed)
PA-Wegovy has been send to plan

## 2023-08-10 NOTE — Telephone Encounter (Signed)
PA-Wegovy       Your prior authorization for [facility] has been approved! More Info Personalized support and financial assistance may be available through the [employer] program. For more information, and to see program requirements, click on the More Info button to the right.  Message from plan: CaseId:[id number];Status:Approved;Review Type:Prior Auth;Coverage Start Date:07/11/2023;Coverage End Date:03/07/2024;. Authorization Expiration Date: March 07, 2024.

## 2023-08-22 ENCOUNTER — Ambulatory Visit (INDEPENDENT_AMBULATORY_CARE_PROVIDER_SITE_OTHER): Payer: BC Managed Care – PPO | Admitting: Physician Assistant

## 2023-08-22 ENCOUNTER — Encounter (INDEPENDENT_AMBULATORY_CARE_PROVIDER_SITE_OTHER): Payer: Self-pay | Admitting: Physician Assistant

## 2023-08-22 VITALS — BP 127/80 | HR 72 | Temp 98.4°F | Ht 65.0 in | Wt 224.0 lb

## 2023-08-22 DIAGNOSIS — M722 Plantar fascial fibromatosis: Secondary | ICD-10-CM | POA: Diagnosis not present

## 2023-08-22 DIAGNOSIS — M7501 Adhesive capsulitis of right shoulder: Secondary | ICD-10-CM

## 2023-08-22 DIAGNOSIS — K219 Gastro-esophageal reflux disease without esophagitis: Secondary | ICD-10-CM

## 2023-08-22 DIAGNOSIS — R7303 Prediabetes: Secondary | ICD-10-CM | POA: Insufficient documentation

## 2023-08-22 DIAGNOSIS — Z0289 Encounter for other administrative examinations: Secondary | ICD-10-CM

## 2023-08-22 DIAGNOSIS — E66812 Obesity, class 2: Secondary | ICD-10-CM

## 2023-08-22 DIAGNOSIS — I10 Essential (primary) hypertension: Secondary | ICD-10-CM

## 2023-08-22 DIAGNOSIS — Z6837 Body mass index (BMI) 37.0-37.9, adult: Secondary | ICD-10-CM

## 2023-08-22 NOTE — Progress Notes (Signed)
Office: [phone]  /  Fax: [phone]   Initial Visit  Kelsey Cooke was seen in clinic today to evaluate for obesity. She is interested in losing weight to improve overall health and reduce the risk of weight related complications. She presents today to review program treatment options, initial physical assessment, and evaluation.     She was referred by: PCP  When asked what else they would like to accomplish? She states: Adopt healthier eating patterns, Improve energy levels and physical activity, Improve existing medical conditions, Reduce number of medications, Reduce risk for a surgery, Improve quality of life, Improve appearance, Improve self-confidence, and Lose a target amount of weight : 155 lbs is goal lbs in 6-9 months.  Weight history: Started gaining weight about 2 years ago and limited by physical issues.   When asked how has your weight affected you? She states: Has affected self-esteem, Relationships, Contributed to medical problems, Contributed to orthopedic problems or mobility issues, Having fatigue, Having poor endurance, and Has affected mood   Some associated conditions: Hypertension, Arthritis:knees/back and shoulders, Fatty liver disease, Prediabetes, GERD, and Other: S/P surgery for goiter and had parathyroid glands removed.   Contributing factors: Disruption of circadian rhythm / sleep disordered breathing, Consumption of processed foods, Reduced physical activity, Menopause, and Slow metabolism for age  Weight promoting medications identified: None  Current nutrition plan: Portion control / smart choices  Current level of physical activity: Limited due to chronic pain or orthopedic problems and Other: stationary bike  Current or previous pharmacotherapy: GLP-1  Response to medication:  Just started [facility]    Past medical history includes:   Past Medical History:  Diagnosis Date   Allergic rhinitis    Anxiety    Breast tenderness in female  10/09/2015   Cutaneous skin tags 03/27/2013   Had 2 minute skin tags on upper abdomen near breast removed   Ear infection 11/13/2015   Elevated BP 11/12/2014   Fatigue 11/12/2014   Fibroid 02/06/2016   GERD (gastroesophageal reflux disease)    Goiter    Headache 01/08/2015   Hemorrhoids 11/12/2014   Hypertension    Menorrhagia 11/08/2013   Missed period 10/09/2015   Pelvic pain in female 01/28/2016   Rectocele 11/12/2014   Sebaceous cyst of breast    Sinus infection 11/08/2013     Objective:   BP 127/80   Pulse 72   Temp 98.4 F (36.9 C)   Ht 5\' 5"  (1.651 m)   Wt 224 lb (101.6 kg)   SpO2 97%   BMI 37.28 kg/m  She was weighed on the bioimpedance scale: Body mass index is 37.28 kg/m.  Peak Weight: 230bs , Body Fat%:46.8%, Visceral Fat Rating:13, Weight trend over the last 12 months: Increasing  General:  Alert, oriented and cooperative. Patient is in no acute distress.  Respiratory: Normal respiratory effort, no problems with respiration noted   Gait: able to ambulate independently  Mental Status: Normal mood and affect. Normal behavior. Normal judgment and thought content.   DIAGNOSTIC DATA REVIEWED:  BMET    Component Value Date/Time   NA 146 (H) 07/21/2022 0835   K 4.0 07/21/2022 0835   CL 102 07/21/2022 0835   CO2 24 07/21/2022 0835   GLUCOSE 100 (H) 07/21/2022 0835   GLUCOSE 95 10/16/2020 1152   BUN 7 07/21/2022 0835   CREATININE 0.83 07/21/2022 0835   CREATININE 0.52 11/08/2013 0950   CALCIUM 9.6 07/21/2022 0835   GFRNONAA >60 10/16/2020 1152   GFRAA 109 03/08/2018 0953  Lab Results  Component Value Date   HGBA1C 5.7 (H) 07/21/2022   No results found for: "INSULIN" CBC    Component Value Date/Time   WBC 6.0 07/21/2022 0835   WBC 6.6 08/30/2020 1034   RBC 5.32 (H) 07/21/2022 0835   RBC 5.70 (H) 08/30/2020 1034   HGB 13.4 07/21/2022 0835   HCT 42.3 07/21/2022 0835   PLT 271 07/21/2022 0835   MCV 80 07/21/2022 0835   MCH 25.2 (L) 07/21/2022 0835   MCH 24.6  (L) 08/30/2020 1034   MCHC 31.7 07/21/2022 0835   MCHC 29.8 (L) 08/30/2020 1034   RDW 13.5 07/21/2022 0835   Iron/TIBC/Ferritin/ %Sat No results found for: "IRON", "TIBC", "FERRITIN", "IRONPCTSAT" Lipid Panel     Component Value Date/Time   CHOL 164 07/21/2022 0835   TRIG 69 07/21/2022 0835   HDL 48 07/21/2022 0835   CHOLHDL 3.4 07/21/2022 0835   CHOLHDL 2.8 11/08/2013 0950   VLDL 17 11/08/2013 0950   LDLCALC 103 (H) 07/21/2022 0835   Hepatic Function Panel     Component Value Date/Time   PROT 7.0 07/21/2022 0835   ALBUMIN 4.3 07/21/2022 0835   AST 23 07/21/2022 0835   ALT 20 07/21/2022 0835   ALKPHOS 100 07/21/2022 0835   BILITOT 0.6 07/21/2022 0835      Component Value Date/Time   TSH 2.200 07/21/2022 0835     Assessment and Plan:   Essential hypertension  Gastroesophageal reflux disease without esophagitis  Bilateral plantar fasciitis  Adhesive capsulitis of right shoulder  Prediabetes  Class 2 severe obesity due to excess calories with serious comorbidity and body mass index (BMI) of 37.0 to 37.9 in adult [facility])        Obesity Treatment / Action Plan:  Patient will work on garnering support from family and friends to begin weight loss journey. Will work on eliminating or reducing the presence of highly palatable, calorie dense foods in the home. Will complete provided nutritional and psychosocial assessment questionnaire before the next appointment. Will be scheduled for indirect calorimetry to determine resting energy expenditure in a fasting state.  This will allow [place] to create a reduced calorie, high-protein meal plan to promote loss of fat mass while preserving muscle mass. Will think about ideas on how to incorporate physical activity into their daily routine. Will work on reducing intake of added sugars, simple sugars and processed carbs. Will avoid skipping meals which may result in increased hunger signals and overeating at certain  times. Counseled on the health benefits of losing 5%-15% of total body weight. Was counseled on nutritional approaches to weight loss and benefits of reducing processed foods and consuming plant-based foods and high quality protein as part of nutritional weight management. Was counseled on pharmacotherapy and role as an adjunct in weight management.   Obesity Education Performed Today:  She was weighed on the bioimpedance scale and results were discussed and documented in the synopsis.  We discussed obesity as a disease and the importance of a more detailed evaluation of all the factors contributing to the disease.  We discussed the importance of long term lifestyle changes which include nutrition, exercise and behavioral modifications as well as the importance of customizing this to her specific health and social needs.  We discussed the benefits of reaching a healthier weight to alleviate the symptoms of existing conditions and reduce the risks of the biomechanical, metabolic and psychological effects of obesity.  Kelsey Cooke appears to be in the action stage of change  and states they are ready to start intensive lifestyle modifications and behavioral modifications.  30 minutes was spent today on this visit including the above counseling, pre-visit chart review, and post-visit documentation.  Reviewed by clinician on day of visit: allergies, medications, problem list, medical history, surgical history, family history, social history, and previous encounter notes pertinent to obesity diagnosis.   Bernis Schreur,PA-C '

## 2023-08-28 ENCOUNTER — Encounter: Payer: Self-pay | Admitting: Family Medicine

## 2023-08-29 ENCOUNTER — Other Ambulatory Visit: Payer: Self-pay | Admitting: Family Medicine

## 2023-08-29 DIAGNOSIS — E66812 Obesity, class 2: Secondary | ICD-10-CM

## 2023-08-29 MED ORDER — SEMAGLUTIDE-WEIGHT MANAGEMENT 0.25 MG/0.5ML ~~LOC~~ SOAJ: 0.5000 mg | SUBCUTANEOUS | 0 refills | Status: DC

## 2023-09-01 ENCOUNTER — Other Ambulatory Visit: Payer: Self-pay | Admitting: Family Medicine

## 2023-09-12 ENCOUNTER — Other Ambulatory Visit: Payer: Self-pay | Admitting: Family Medicine

## 2023-09-12 MED ORDER — SCOPOLAMINE 1 MG/3DAYS TD PT72: 1.0000 | MEDICATED_PATCH | TRANSDERMAL | 12 refills | Status: DC

## 2023-09-28 ENCOUNTER — Other Ambulatory Visit: Payer: Self-pay | Admitting: Family Medicine

## 2023-09-28 DIAGNOSIS — Z6838 Body mass index (BMI) 38.0-38.9, adult: Secondary | ICD-10-CM

## 2023-10-02 ENCOUNTER — Other Ambulatory Visit: Payer: Self-pay | Admitting: Family Medicine

## 2023-10-02 DIAGNOSIS — E66812 Obesity, class 2: Secondary | ICD-10-CM

## 2023-10-03 ENCOUNTER — Other Ambulatory Visit: Payer: Self-pay

## 2023-10-03 DIAGNOSIS — Z6838 Body mass index (BMI) 38.0-38.9, adult: Secondary | ICD-10-CM

## 2023-10-03 MED ORDER — WEGOVY 0.25 MG/0.5ML ~~LOC~~ SOAJ: 0.2500 mg | SUBCUTANEOUS | 0 refills | Status: DC

## 2023-10-04 ENCOUNTER — Ambulatory Visit (INDEPENDENT_AMBULATORY_CARE_PROVIDER_SITE_OTHER): Payer: BC Managed Care – PPO | Admitting: Family Medicine

## 2023-10-04 ENCOUNTER — Encounter (INDEPENDENT_AMBULATORY_CARE_PROVIDER_SITE_OTHER): Payer: Self-pay | Admitting: Family Medicine

## 2023-10-04 VITALS — BP 141/82 | HR 77 | Temp 98.3°F | Ht 65.0 in | Wt 217.0 lb

## 2023-10-04 DIAGNOSIS — R7303 Prediabetes: Secondary | ICD-10-CM

## 2023-10-04 DIAGNOSIS — Z1331 Encounter for screening for depression: Secondary | ICD-10-CM

## 2023-10-04 DIAGNOSIS — R0602 Shortness of breath: Secondary | ICD-10-CM | POA: Diagnosis not present

## 2023-10-04 DIAGNOSIS — Z6837 Body mass index (BMI) 37.0-37.9, adult: Secondary | ICD-10-CM

## 2023-10-04 DIAGNOSIS — E049 Nontoxic goiter, unspecified: Secondary | ICD-10-CM

## 2023-10-04 DIAGNOSIS — E559 Vitamin D deficiency, unspecified: Secondary | ICD-10-CM

## 2023-10-04 DIAGNOSIS — R5383 Other fatigue: Secondary | ICD-10-CM

## 2023-10-04 DIAGNOSIS — I10 Essential (primary) hypertension: Secondary | ICD-10-CM | POA: Diagnosis not present

## 2023-10-04 DIAGNOSIS — E66812 Obesity, class 2: Secondary | ICD-10-CM

## 2023-10-04 DIAGNOSIS — F988 Other specified behavioral and emotional disorders with onset usually occurring in childhood and adolescence: Secondary | ICD-10-CM

## 2023-10-04 NOTE — Assessment & Plan Note (Signed)
On hydrochlorothiazide for management.  She does not remember to take medication everyday.  She did not take it today.  CMP today.

## 2023-10-04 NOTE — Assessment & Plan Note (Signed)
Not on medication currently.  Never taken medication for this but was prescribed for it.  She is interested in pursuing medication and treatment for this.

## 2023-10-04 NOTE — Progress Notes (Signed)
 Chief Complaint:  Obesity   Subjective:  Kelsey Cooke (MR# [id number]) is a 53 y.o. female who presents for evaluation and treatment of obesity and related comorbidities.   Kelsey Cooke is currently in the action stage of change and ready to dedicate time achieving and maintaining a healthier weight. Kelsey Cooke is interested in becoming our patient and working on intensive lifestyle modifications including (but not limited to) diet and exercise for weight loss.  Kelsey Cooke has been struggling with her weight. She has been unsuccessful in either losing weight, maintaining weight loss, or reaching her healthy weight goal.  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  Lives at home with husband Kelsey Cooke), daughter Kelsey Cooke and son Kelsey Cooke.  She reports her kids are picky eaters. Started gaining weight in 2011- thinks this was due to inactivity, increased eating out. Previously lost 55lbs with [employer].  Kept weight off for 8 months.  Patient voices she eats out 2-3 times a week at restaurants fast food or takeout.  She does most of the grocery shopping once a week.  She does not use a list.  She likes to cook occasionally but biggest obstacles are time and fatigue.  Her biggest food cravings are sweets and she tends to snack on cookies, candy, chips, apples, oranges, celery and peanut butter.  Her food dislikes are hamburgers, salmon, shrimp, tuna, cottage cheese, avocado, kiwi and Greek yogurt.  She does consider herself a picky eater and voices that she snacks frequently between meals more than 1 time a day and frequently at night after dinner.  She skips breakfast and sometimes lunch due to time and not being hungry.  She does state that eating healthy has been financially difficult for her and that her family does make eating healthy difficult because they are picky eater's.  She considers her worst food habits to be snacking and states that she struggles with poor food choices and portion control.  Some days she states  she has excessive hunger.  Food Recall: Coffee in the am with sugar free creamer and sometimes she uses a pack of truvia with 1 spoon honey.  Drinks water  until lunch.  Has a snack in the am like nuts- eating about 1/3 cup.  Sometimes she is hungry other times she isn't.  Lunch is pinto beans, turnip greens and cornbread- farmer's table. 1 cup beans, 1 cup greens, 1.5 cups worth of corn bread. Feels full.  6-7pm eats dinner like tacos- tortilla shell with beans, cheese, tomatoes and lettuce and thousand island dressing. May had something sweet after eating.    Indirect Calorimeter completed today shows a RMR: 1814. Her calculated basal metabolic rate is 8330 thus her basal metabolic rate is better than expected.  Other Fatigue Kelsey Cooke  sometimes has  daytime somnolence and  sometimes  waking up still tired. Patient has a history of symptoms of daytime fatigue. Kelsey Cooke generally gets 7 hours of sleep per night, and states that she has generally restful sleep. Snoring is present. Apneic episodes is present. Epworth Sleepiness Score is 16.   Shortness of Breath Kelsey Cooke notes increasing shortness of breath with exercising and seems to be worsening over time with weight gain. She notes getting out of breath sooner with activity than she used to. This has not gotten worse recently. [name] denies shortness of breath at rest or orthopnea.  Depression Screen Kelsey Cooke's Food and Mood (modified PHQ-9) score was 13.     08/09/2023   11:05 AM  Depression screen PHQ 2/9  Down, Depressed,  Hopeless 0  PHQ - 2 Score 0  Altered sleeping 0  Tired, decreased energy 1  Change in appetite 1  Feeling bad or failure about yourself  0  Trouble concentrating 2  Moving slowly or fidgety/restless 0  Suicidal thoughts 0  PHQ-9 Score 4  Difficult doing work/chores Somewhat difficult     Objective:  No data recorded  No data recorded  No data recorded  No data recorded   EKG: Normal sinus rhythm,  rate 73 with poor R wave progression  General: Cooperative, alert, well developed, in no acute distress. HEENT: Conjunctivae and lids unremarkable. Cardiovascular: Regular rhythm.  Lungs: Normal work of breathing. Neurologic: No focal deficits.   Lab Results  Component Value Date   CREATININE 0.76 10/04/2023   BUN 7 10/04/2023   NA 145 (H) 10/04/2023   K 4.1 10/04/2023   CL 103 10/04/2023   CO2 23 10/04/2023   Lab Results  Component Value Date   ALT 24 10/04/2023   AST 22 10/04/2023   ALKPHOS 111 10/04/2023   BILITOT 0.7 10/04/2023   Lab Results  Component Value Date   HGBA1C 5.5 10/04/2023   HGBA1C 5.7 (H) 07/21/2022   Lab Results  Component Value Date   INSULIN  14.7 10/04/2023   Lab Results  Component Value Date   TSH 1.670 10/04/2023   Lab Results  Component Value Date   CHOL 167 10/04/2023   HDL 47 10/04/2023   LDLCALC 107 (H) 10/04/2023   TRIG 68 10/04/2023   CHOLHDL 3.4 07/21/2022   Lab Results  Component Value Date   WBC 6.0 10/04/2023   HGB 14.4 10/04/2023   HCT 45.8 10/04/2023   MCV 79 10/04/2023   PLT 274 10/04/2023   No results found for: IRON, TIBC, FERRITIN  Assessment and Plan:   Other Fatigue  Kelsey Cooke does feel that her weight is causing her energy to be lower than it should be. Fatigue may be related to obesity, depression or many other causes. Labs will be ordered, and in the meanwhile, Kelsey Cooke will focus on self care including making healthy food choices, increasing physical activity and focusing on stress reduction.  Shortness of Breath  Kelsey Cooke does feel that she gets out of breath more easily that she used to when she exercises. 's shortness of breath appears to be obesity related and exercise induced. She has agreed to work on weight loss and gradually increase exercise to treat her exercise induced shortness of breath. Will continue to monitor closely.   Problem List Items Addressed This Visit       Cardiovascular  and Mediastinum   Essential hypertension   On hydrochlorothiazide  for management.  She does not remember to take medication everyday.  She did not take it today.  CMP today.        Endocrine   Goiter   Patient has a history of goiter removal and parathyroid removal.  She is not sure who is monitoring her levels currently.  Will draw Calcium and PTH today.      Relevant Orders   T4, free (Completed)   T3 (Completed)   TSH (Completed)   PTH, Intact and Calcium     Other   Attention deficit disorder (ADD) in adult   Not on medication currently.  Never taken medication for this but was prescribed for it.  She is interested in pursuing medication and treatment for this.      Class 2 severe obesity due to excess calories with serious  comorbidity and body mass index (BMI) of 37.0 to 37.9 in adult (HCC)   Starting weight: 217 Peak weight: 224 BMR: 1814 Previous obesity management: Started on Wegovy  about 2 months ago.  She has lost about 10lbs since starting.  [employer]- lost 55lbs kept off for 8 months Body Fat %: 45.2 Starting Meal Plan: 1350-1450 calories with 90 or more grams of protein Meal Plan needs: doesn't eat much meat Likes/ Dislikes of meal plan: n/a       Prediabetes   Only A1c in prediabetic range from November 2023.  Patient was previously unaware she had this diagnosis. Repeat A1c and Insulin  level today.      Relevant Orders   Comprehensive metabolic panel (Completed)   Hemoglobin A1c (Completed)   Insulin , random (Completed)   Vitamin D  deficiency   Historical diagnosis.  Patient is not on current vitamin d  supplementation.  Vitamin D  level ordered today.      Relevant Orders   VITAMIN D  25 Hydroxy (Vit-D Deficiency, Fractures) (Completed)   Other Visit Diagnoses       Other fatigue    -  Primary   Relevant Orders   EKG 12-Lead (Completed)   Vitamin B12 (Completed)   Folate (Completed)     SOBOE (shortness of breath on exertion)       Relevant Orders    CBC with Differential/Platelet (Completed)   Lipid Panel With LDL/HDL Ratio (Completed)     Class 2 severe obesity with serious comorbidity and body mass index (BMI) of 36.0 to 36.9 in adult, unspecified obesity type (HCC)           Kelsey Cooke is currently in the action stage of change and her goal is to continue with weight loss efforts. I recommend Kelsey Cooke begin the structured treatment plan as follows:  She has agreed to keeping a food journal and adhering to recommended goals of 1350-1450 calories and 90 or more grams of protein and Pescatarian Plan  Exercise goals: No exercise has been prescribed at this time.  Behavioral modification strategies:increasing lean protein intake, increasing vegetables, meal planning and cooking strategies, keeping healthy foods in the home, and better snacking choices  She was informed of the importance of frequent follow-up visits to maximize her success with intensive lifestyle modifications for her multiple health conditions. She was informed we would discuss her lab results at her next visit unless there is a critical issue that needs to be addressed sooner. Kelsey Cooke agreed to keep her next visit at the agreed upon time to discuss these results.   Attestation Statements:  Reviewed by clinician on day of visit: allergies, medications, problem list, medical history, surgical history, family history, social history, and previous encounter notes.  Time spent on visit including pre-visit chart review and post-visit charting and care was 60 minutes.   Adelita Cho, MD

## 2023-10-04 NOTE — Assessment & Plan Note (Signed)
Only A1c in prediabetic range from November 2023.  Patient was previously unaware she had this diagnosis. Repeat A1c and Insulin level today.

## 2023-10-04 NOTE — Assessment & Plan Note (Addendum)
 Starting weight: 217 Peak weight: 224 BMR: 1814 Previous obesity management: Started on Wegovy  about 2 months ago.  She has lost about 10lbs since starting.  [employer]- lost 55lbs kept off for 8 months Body Fat %: 45.2 Starting Meal Plan: 1350-1450 calories with 90 or more grams of protein Meal Plan needs: doesn't eat much meat Likes/ Dislikes of meal plan: n/a

## 2023-10-04 NOTE — Assessment & Plan Note (Signed)
Patient has a history of goiter removal and parathyroid removal.  She is not sure who is monitoring her levels currently.  Will draw Calcium and PTH today.

## 2023-10-05 LAB — COMPREHENSIVE METABOLIC PANEL
ALT: 24 [IU]/L (ref 0–32)
AST: 22 [IU]/L (ref 0–40)
Albumin: 4.3 g/dL (ref 3.8–4.9)
Alkaline Phosphatase: 111 [IU]/L (ref 44–121)
BUN/Creatinine Ratio: 9 (ref 9–23)
BUN: 7 mg/dL (ref 6–24)
Bilirubin Total: 0.7 mg/dL (ref 0.0–1.2)
CO2: 23 mmol/L (ref 20–29)
Calcium: 9.4 mg/dL (ref 8.7–10.2)
Chloride: 103 mmol/L (ref 96–106)
Creatinine, Ser: 0.76 mg/dL (ref 0.57–1.00)
Globulin, Total: 3 g/dL (ref 1.5–4.5)
Glucose: 86 mg/dL (ref 70–99)
Potassium: 4.1 mmol/L (ref 3.5–5.2)
Sodium: 145 mmol/L — ABNORMAL HIGH (ref 134–144)
Total Protein: 7.3 g/dL (ref 6.0–8.5)
eGFR: 94 mL/min/{1.73_m2} (ref >59)

## 2023-10-05 LAB — LIPID PANEL WITH LDL/HDL RATIO
Cholesterol, Total: 167 mg/dL (ref 100–199)
HDL: 47 mg/dL (ref >39)
LDL Chol Calc (NIH): 107 mg/dL — ABNORMAL HIGH (ref 0–99)
LDL/HDL Ratio: 2.3 {ratio} (ref 0.0–3.2)
Triglycerides: 68 mg/dL (ref 0–149)
VLDL Cholesterol Cal: 13 mg/dL (ref 5–40)

## 2023-10-05 LAB — PTH, INTACT AND CALCIUM: PTH: 48 pg/mL (ref 15–65)

## 2023-10-05 LAB — CBC WITH DIFFERENTIAL/PLATELET
Basophils Absolute: 0 10*3/uL (ref 0.0–0.2)
Basos: 1 %
EOS (ABSOLUTE): 0.1 10*3/uL (ref 0.0–0.4)
Eos: 1 %
Hematocrit: 45.8 % (ref 34.0–46.6)
Hemoglobin: 14.4 g/dL (ref 11.1–15.9)
Immature Grans (Abs): 0 10*3/uL (ref 0.0–0.1)
Immature Granulocytes: 0 %
Lymphocytes Absolute: 1.7 10*3/uL (ref 0.7–3.1)
Lymphs: 28 %
MCH: 25 pg — ABNORMAL LOW (ref 26.6–33.0)
MCHC: 31.4 g/dL — ABNORMAL LOW (ref 31.5–35.7)
MCV: 79 fL (ref 79–97)
Monocytes Absolute: 0.3 10*3/uL (ref 0.1–0.9)
Monocytes: 5 %
Neutrophils Absolute: 3.9 10*3/uL (ref 1.4–7.0)
Neutrophils: 65 %
Platelets: 274 10*3/uL (ref 150–450)
RBC: 5.77 x10E6/uL — ABNORMAL HIGH (ref 3.77–5.28)
RDW: 14.2 % (ref 11.7–15.4)
WBC: 6 10*3/uL (ref 3.4–10.8)

## 2023-10-05 LAB — TSH: TSH: 1.67 u[IU]/mL (ref 0.450–4.500)

## 2023-10-05 LAB — INSULIN, RANDOM: INSULIN: 14.7 u[IU]/mL (ref 2.6–24.9)

## 2023-10-05 LAB — VITAMIN B12: Vitamin B-12: 284 pg/mL (ref 232–1245)

## 2023-10-05 LAB — T4, FREE: Free T4: 1.14 ng/dL (ref 0.82–1.77)

## 2023-10-05 LAB — T3: T3, Total: 167 ng/dL (ref 71–180)

## 2023-10-05 LAB — VITAMIN D 25 HYDROXY (VIT D DEFICIENCY, FRACTURES): Vit D, 25-Hydroxy: 33.1 ng/mL (ref 30.0–100.0)

## 2023-10-05 LAB — FOLATE: Folate: 7.6 ng/mL (ref >3.0)

## 2023-10-05 LAB — HEMOGLOBIN A1C
Est. average glucose Bld gHb Est-mCnc: 111 mg/dL
Hgb A1c MFr Bld: 5.5 % (ref 4.8–5.6)

## 2023-10-10 ENCOUNTER — Other Ambulatory Visit: Payer: Self-pay | Admitting: Family Medicine

## 2023-10-10 DIAGNOSIS — Z6838 Body mass index (BMI) 38.0-38.9, adult: Secondary | ICD-10-CM

## 2023-10-10 MED ORDER — SEMAGLUTIDE-WEIGHT MANAGEMENT 0.5 MG/0.5ML ~~LOC~~ SOAJ: 0.5000 mg | SUBCUTANEOUS | 0 refills | Status: DC

## 2023-10-11 DIAGNOSIS — E559 Vitamin D deficiency, unspecified: Secondary | ICD-10-CM | POA: Insufficient documentation

## 2023-10-11 NOTE — Assessment & Plan Note (Signed)
Historical diagnosis.  Patient is not on current vitamin d supplementation.  Vitamin D level ordered today.

## 2023-10-13 ENCOUNTER — Encounter (INDEPENDENT_AMBULATORY_CARE_PROVIDER_SITE_OTHER): Payer: Self-pay

## 2023-10-17 ENCOUNTER — Encounter (INDEPENDENT_AMBULATORY_CARE_PROVIDER_SITE_OTHER): Payer: Self-pay

## 2023-10-18 ENCOUNTER — Ambulatory Visit (INDEPENDENT_AMBULATORY_CARE_PROVIDER_SITE_OTHER): Payer: BC Managed Care – PPO | Admitting: Family Medicine

## 2023-11-03 ENCOUNTER — Other Ambulatory Visit: Payer: Self-pay | Admitting: Family Medicine

## 2023-11-04 ENCOUNTER — Other Ambulatory Visit: Payer: Self-pay

## 2023-11-04 MED ORDER — SEMAGLUTIDE-WEIGHT MANAGEMENT 0.5 MG/0.5ML ~~LOC~~ SOAJ: 0.5000 mg | SUBCUTANEOUS | 0 refills | Status: DC

## 2023-11-10 ENCOUNTER — Encounter (INDEPENDENT_AMBULATORY_CARE_PROVIDER_SITE_OTHER): Payer: Self-pay

## 2023-11-15 ENCOUNTER — Ambulatory Visit (INDEPENDENT_AMBULATORY_CARE_PROVIDER_SITE_OTHER): Payer: BC Managed Care – PPO | Admitting: Family Medicine

## 2023-11-15 ENCOUNTER — Encounter (INDEPENDENT_AMBULATORY_CARE_PROVIDER_SITE_OTHER): Payer: Self-pay | Admitting: Family Medicine

## 2023-11-15 VITALS — BP 134/84 | HR 76 | Temp 98.4°F | Ht 65.0 in | Wt 212.0 lb

## 2023-11-15 DIAGNOSIS — E049 Nontoxic goiter, unspecified: Secondary | ICD-10-CM

## 2023-11-15 DIAGNOSIS — I10 Essential (primary) hypertension: Secondary | ICD-10-CM | POA: Diagnosis not present

## 2023-11-15 DIAGNOSIS — E66812 Obesity, class 2: Secondary | ICD-10-CM

## 2023-11-15 DIAGNOSIS — Z6835 Body mass index (BMI) 35.0-35.9, adult: Secondary | ICD-10-CM

## 2023-11-15 DIAGNOSIS — E559 Vitamin D deficiency, unspecified: Secondary | ICD-10-CM

## 2023-11-15 DIAGNOSIS — R7303 Prediabetes: Secondary | ICD-10-CM

## 2023-11-15 DIAGNOSIS — Z6837 Body mass index (BMI) 37.0-37.9, adult: Secondary | ICD-10-CM

## 2023-11-15 MED ORDER — VITAMIN D (ERGOCALCIFEROL) 1.25 MG (50000 UNIT) PO CAPS: 50000.0000 [IU] | ORAL_CAPSULE | ORAL | 0 refills | Status: DC

## 2023-11-15 NOTE — Assessment & Plan Note (Addendum)
 Blood pressure well controlled today.  She is on hydrochlorothiazide daily.  Has been making dietary changes to be more mindful of macronutrient intake. Continue current medication at same dosage and follow up on BP at next appointment.

## 2023-11-15 NOTE — Progress Notes (Signed)
 SUBJECTIVE:  Chief Complaint: Obesity  Interim History: Patient presents for follow up from first appointment on February 4.  She feels like she is getting hungrier.  She stopped going to [employer] and places like that.  She has cut her portion sizes back when she is eating out.  She sometimes forget to count calories.  She was averaging around 1650-1800 calories and made sure she was getting meat in daily.  She has gone out and got [place] sausage links to incorporate food in.  Kelsey Cooke is here to discuss her progress with her obesity treatment plan. She is on the keeping a food journal and adhering to recommended goals of 1350-1450 calories and 90 grams of protein and states she is following her eating plan approximately 50 % of the time. She states she is walking 15 minutes 3 times per week.   OBJECTIVE: Visit Diagnoses: Problem List Items Addressed This Visit       Cardiovascular and Mediastinum   Essential hypertension   Blood pressure well controlled today.  She is on hydrochlorothiazide daily.  Has been making dietary changes to be more mindful of macronutrient intake. Continue current medication at same dosage and follow up on BP at next appointment.        Endocrine   Goiter   Historical diagnosis but labs from first appointment all WNL (Thyroid panel and PTH).  Repeat labs in 5-6 months.        Other   Class 2 severe obesity due to excess calories with serious comorbidity and body mass index (BMI) of 37.0 to 37.9 in adult (HCC)   Starting weight: 217 Peak weight: 224 BMR: 1814 Previous obesity management: Started on Wegovy about 2 months ago.  She has lost about 10lbs since starting.  [employer]- lost 55lbs kept off for 8 months Body Fat %: 45.2 Starting Meal Plan: 1350-1450 calories with 90 or more grams of protein Meal Plan needs: doesn't eat much meat  Anthropometric Measurements Height: 5\' 5"  (1.651 m) Weight: 212 lb (96.2 kg) BMI (Calculated): 35.28 Weight at  Last Visit: 217 lb Weight Lost Since Last Visit: 5 Weight Gained Since Last Visit: 0 Starting Weight: 217 lb Total Weight Loss (lbs): 5 lb (2.268 kg) Peak Weight: 224 lb Body Composition  Body Fat %: 44.6 % Fat Mass (lbs): 94.8 lbs Muscle Mass (lbs): 111.8 lbs Total Body Water (lbs): 79 lbs Visceral Fat Rating : 12 Other Clinical Data Today's Visit #: 2 Starting Date: 10/04/23 Comments: 1350-1450/90        Prediabetes   Pathophysiology of progression through insulin resistance to prediabetes and diabetes was discussed at length today.  Patient to continue to monitor and be in control of total intake of snack calories which may be simple carbohydrates but should be consumed only after the patient has taken in all the nutrition for the day.  Macronutrient identification, classification and daily intake ratios were discussed.  Plan to repeat labs in 3 months to monitor both hemoglobin A1c and insulin levels.  No medications at this time as patient is not having significant hunger or cravings that would make following meal plan more difficult.         Vitamin D deficiency - Primary   Discussed importance of vitamin d supplementation.  Vitamin d supplementation has been shown to decrease fatigue, decrease risk of progression to insulin resistance and then prediabetes, decreases risk of falling in older age and can even assist in decreasing depressive symptoms in PTSD.  Prescription for Vitamin D sent in.        Relevant Medications   Vitamin D, Ergocalciferol, (DRISDOL) 1.25 MG (50000 UNIT) CAPS capsule   Other Visit Diagnoses       BMI 35.0-35.9,adult           Vitals Temp: 98.4 F (36.9 C) BP: 134/84 Pulse Rate: 76 SpO2: 96 %        ASSESSMENT AND PLAN:  Diet: Kelsey Cooke is currently in the action stage of change. As such, her goal is to continue with weight loss efforts and has agreed to keeping a food journal and adhering to recommended goals of 1350-1450  calories and 90 or more grams protein daily.   Exercise:  All adults should avoid inactivity. Some activity is better than none, and adults who participate in any amount of physical activity, gain some health benefits.  Behavior Modification:  We discussed the following Behavioral Modification Strategies today: increasing lean protein intake, increasing vegetables, meal planning and cooking strategies, avoiding temptations, planning for success, and keep a strict food journal. We discussed various medication options to help Kelsey Cooke with her weight loss efforts and we both agreed to continue Wegovy at current dose.  No follow-ups on file.Marland Kitchen She was informed of the importance of frequent follow up visits to maximize her success with intensive lifestyle modifications for her multiple health conditions.  Attestation Statements:   Reviewed by clinician on day of visit: allergies, medications, problem list, medical history, surgical history, family history, social history, and previous encounter notes.   Time spent on visit including pre-visit chart review and post-visit care and charting was 45 minutes  Reuben Likes, MD

## 2023-11-15 NOTE — Assessment & Plan Note (Signed)

## 2023-11-15 NOTE — Assessment & Plan Note (Signed)
 Discussed importance of vitamin d supplementation.  Vitamin d supplementation has been shown to decrease fatigue, decrease risk of progression to insulin resistance and then prediabetes, decreases risk of falling in older age and can even assist in decreasing depressive symptoms in PTSD.   Prescription for Vitamin D sent in.

## 2023-11-15 NOTE — Assessment & Plan Note (Signed)
 Starting weight: 217 Peak weight: 224 BMR: 1814 Previous obesity management: Started on Wegovy about 2 months ago.  She has lost about 10lbs since starting.  Optavia- lost 55lbs kept off for 8 months Body Fat %: 45.2 Starting Meal Plan: 1350-1450 calories with 90 or more grams of protein Meal Plan needs: doesn't eat much meat  Anthropometric Measurements Height: 5\' 5"  (1.651 m) Weight: 212 lb (96.2 kg) BMI (Calculated): 35.28 Weight at Last Visit: 217 lb Weight Lost Since Last Visit: 5 Weight Gained Since Last Visit: 0 Starting Weight: 217 lb Total Weight Loss (lbs): 5 lb (2.268 kg) Peak Weight: 224 lb Body Composition  Body Fat %: 44.6 % Fat Mass (lbs): 94.8 lbs Muscle Mass (lbs): 111.8 lbs Total Body Water (lbs): 79 lbs Visceral Fat Rating : 12 Other Clinical Data Today's Visit #: 2 Starting Date: 10/04/23 Comments: 1350-1450/90

## 2023-11-15 NOTE — Assessment & Plan Note (Signed)
 Historical diagnosis but labs from first appointment all WNL (Thyroid panel and PTH).  Repeat labs in 5-6 months.

## 2023-11-27 ENCOUNTER — Other Ambulatory Visit: Payer: Self-pay | Admitting: Family Medicine

## 2023-11-28 MED ORDER — SEMAGLUTIDE-WEIGHT MANAGEMENT 0.5 MG/0.5ML ~~LOC~~ SOAJ: 0.5000 mg | SUBCUTANEOUS | 0 refills | Status: DC

## 2023-12-01 ENCOUNTER — Encounter (INDEPENDENT_AMBULATORY_CARE_PROVIDER_SITE_OTHER): Payer: Self-pay | Admitting: Family Medicine

## 2023-12-01 ENCOUNTER — Encounter: Payer: Self-pay | Admitting: Family Medicine

## 2023-12-20 ENCOUNTER — Ambulatory Visit (INDEPENDENT_AMBULATORY_CARE_PROVIDER_SITE_OTHER): Admitting: Family Medicine

## 2023-12-20 ENCOUNTER — Encounter (INDEPENDENT_AMBULATORY_CARE_PROVIDER_SITE_OTHER): Payer: Self-pay | Admitting: Family Medicine

## 2023-12-20 VITALS — BP 128/81 | HR 66 | Temp 98.3°F | Ht 65.0 in | Wt 208.0 lb

## 2023-12-20 DIAGNOSIS — R7303 Prediabetes: Secondary | ICD-10-CM

## 2023-12-20 DIAGNOSIS — Z6834 Body mass index (BMI) 34.0-34.9, adult: Secondary | ICD-10-CM | POA: Diagnosis not present

## 2023-12-20 DIAGNOSIS — E66812 Obesity, class 2: Secondary | ICD-10-CM

## 2023-12-20 MED ORDER — SEMAGLUTIDE-WEIGHT MANAGEMENT 0.5 MG/0.5ML ~~LOC~~ SOAJ: 0.5000 mg | SUBCUTANEOUS | 0 refills | Status: DC

## 2023-12-20 NOTE — Progress Notes (Signed)
 SUBJECTIVE:  Chief Complaint: Obesity  Interim History: Patient has been trying to stay consistent with food logging since last appointment.  She has been logging about 75% of the time. She is starting to learn about the nutritional value of foods she is eating.  She is on Wegovy  and is experiencing no GI issues.    Kelsey Cooke is here to discuss her progress with her obesity treatment plan. She is on the keeping a food journal and adhering to recommended goals of 1350-1450 calories and 90 grams of protein and states she is following her eating plan approximately 70 % of the time. She states she is not walking as much.   OBJECTIVE: Visit Diagnoses: Problem List Items Addressed This Visit       Other   Prediabetes   Patient is on Wegovy  to help in management of her obesity.  She is is not experiencing any GI side effects but also is working on ensuring adequate nutrition intake.  Will need repeat labs in 3 months.      Other Visit Diagnoses       Class 2 severe obesity with serious comorbidity and body mass index (BMI) of 36.0 to 36.9 in adult, unspecified obesity type (HCC)    -  Primary   Relevant Medications   Semaglutide -Weight Management 0.5 MG/0.5ML SOAJ     BMI 34.0-34.9,adult           Vitals Temp: 98.3 F (36.8 C) BP: 128/81 Pulse Rate: 66 SpO2: 99 %   Anthropometric Measurements Height: 5\' 5"  (1.651 m) Weight: 208 lb (94.3 kg) BMI (Calculated): 34.61 Weight at Last Visit: 212 lb Weight Lost Since Last Visit: 4 Weight Gained Since Last Visit: 0 Starting Weight: 217 lb Total Weight Loss (lbs): 9 lb (4.082 kg) Peak Weight: 224 lb   Body Composition  Body Fat %: 44 % Fat Mass (lbs): 91.6 lbs Muscle Mass (lbs): 110.8 lbs Total Body Water  (lbs): 78.2 lbs Visceral Fat Rating : 12   Other Clinical Data Today's Visit #: 3 Starting Date: 10/04/23 Comments: 1350-1450/90     ASSESSMENT AND PLAN:  Diet: Kelsey Cooke is currently in the action stage of  change. As such, her goal is to continue with weight loss efforts and has agreed to keeping a food journal and adhering to recommended goals of 1350-1450 calories and 90 or more grams protein daily.  Patient to start food log or journaling meal plan.  The initial goal will be to habitually log or journal for at least 4 days a week.  The expectation it that patient may not initially meet calorie or protein goals as the nturitional understanding of food intake is begun.  We discussed the 10:1 ratio when reading a food label.  Patient agrees to keep a food log either electronically or on paper and bring to the next appointment to be able to dissect and discuss it with provider. ;   Exercise:  All adults should avoid inactivity. Some activity is better than none, and adults who participate in any amount of physical activity, gain some health benefits.  Behavior Modification:  We discussed the following Behavioral Modification Strategies today: increasing lean protein intake, decreasing simple carbohydrates, meal planning and cooking strategies, avoiding temptations, planning for success, and keep a strict food journal. We discussed various medication options to help Kelsey Cooke with her weight loss efforts and we both agreed to continue wegovy  and will stay at same dose unless required to increase by insurance.  Return in about  3 weeks (around 01/10/2024).Aaron Aas She was informed of the importance of frequent follow up visits to maximize her success with intensive lifestyle modifications for her multiple health conditions.  Attestation Statements:   Reviewed by clinician on day of visit: allergies, medications, problem list, medical history, surgical history, family history, social history, and previous encounter notes.     Donaciano Frizzle, MD

## 2023-12-21 NOTE — Assessment & Plan Note (Signed)
 Patient is on Wegovy  to help in management of her obesity.  She is is not experiencing any GI side effects but also is working on ensuring adequate nutrition intake.  Will need repeat labs in 3 months.

## 2023-12-22 ENCOUNTER — Ambulatory Visit: Admitting: Nurse Practitioner

## 2023-12-22 VITALS — BP 124/78 | HR 83 | Temp 98.4°F | Ht 65.0 in | Wt 214.8 lb

## 2023-12-22 DIAGNOSIS — F988 Other specified behavioral and emotional disorders with onset usually occurring in childhood and adolescence: Secondary | ICD-10-CM | POA: Diagnosis not present

## 2023-12-22 DIAGNOSIS — Z0001 Encounter for general adult medical examination with abnormal findings: Secondary | ICD-10-CM | POA: Diagnosis not present

## 2023-12-22 DIAGNOSIS — K219 Gastro-esophageal reflux disease without esophagitis: Secondary | ICD-10-CM

## 2023-12-22 DIAGNOSIS — I10 Essential (primary) hypertension: Secondary | ICD-10-CM

## 2023-12-22 DIAGNOSIS — Z0000 Encounter for general adult medical examination without abnormal findings: Secondary | ICD-10-CM

## 2023-12-22 MED ORDER — AMPHETAMINE-DEXTROAMPHET ER 10 MG PO CP24: 10.0000 mg | ORAL_CAPSULE | Freq: Every day | ORAL | 0 refills | Status: DC

## 2023-12-22 NOTE — Progress Notes (Unsigned)
 Subjective:    Patient ID: Kelsey Cooke, female    DOB: 1970/10/02, 53 y.o.   MRN: [id number]  HPI The patient comes in today for a wellness visit. Just changed insurance and needed a wellness physical.    A review of their health history was completed.  A review of medications was also completed.  Any needed refills; No  Eating habits: Described as "fair"  Falls/  MVA accidents in past few months: No  Regular exercise: No; has a sedentary desk job   Specialist pt sees on regular basis: Nutritionist; Wegovy  and labs done through [facility]  Preventative health issues were discussed.   Additional concerns: Problems with attention and focus; has had problems most of her life but struggling at work lately; also has issues with letters and spelling; has not been diagnosed with dyslexia. Has been able to adapt using spell check and other technology. States she will zone out at times.  Nexium  overall controlling her GERD. Occasional flare up depending on what she eats such as ketchup. Regular dental and vision exams.  [facility]. Social History   Tobacco Use   Smoking status: Former    Types: Cigarettes   Smokeless tobacco: Never  Vaping Use   Vaping status: Never Used  Substance Use Topics   Alcohol use: No   Drug use: No   Limited caffeine. No regular NSAID use.  Review of Systems  Constitutional:  Positive for fatigue. Negative for activity change and appetite change.  HENT:  Negative for sore throat and trouble swallowing.   Respiratory:  Negative for cough, chest tightness, shortness of breath and wheezing.   Cardiovascular:  Negative for chest pain.  Gastrointestinal:  Negative for abdominal distention, abdominal pain, constipation, diarrhea, nausea and vomiting.  Genitourinary:  Negative for difficulty urinating, dysuria, enuresis, frequency and urgency.       Objective:   Physical Exam Vitals and nursing note reviewed.   Constitutional:      General: She is not in acute distress.    Appearance: She is well-developed.  Neck:     Thyroid : No thyromegaly.     Trachea: No tracheal deviation.     Comments: Thyroid  non tender to palpation. No mass or goiter noted.  Cardiovascular:     Rate and Rhythm: Normal rate and regular rhythm.     Heart sounds: Normal heart sounds. No murmur heard. Pulmonary:     Effort: Pulmonary effort is normal.     Breath sounds: Normal breath sounds.  Abdominal:     General: There is no distension.     Palpations: Abdomen is soft.     Tenderness: There is no abdominal tenderness.  Musculoskeletal:     Cervical back: Normal range of motion and neck supple.  Lymphadenopathy:     Cervical: No cervical adenopathy.     Upper Body:     Right upper body: No supraclavicular adenopathy.     Left upper body: No supraclavicular adenopathy.  Skin:    General: Skin is warm and dry.     Findings: No rash.  Neurological:     Mental Status: She is alert and oriented to person, place, and time.  Psychiatric:        Mood and Affect: Mood normal.        Behavior: Behavior normal.        Thought Content: Thought content normal.        Judgment: Judgment normal.  Today's Vitals   12/22/23 0843  BP: 124/78  Pulse: 83  Temp: 98.4 F (36.9 C)  SpO2: 98%  Weight: 214 lb 12.8 oz (97.4 kg)  Height: 5\' 5"  (1.651 m)   Body mass index is 35.74 kg/m.  Adult ADHD Self Report Scale (most recent)     Adult ADHD Self-Report Scale (ASRS-v1.1) Symptom Checklist - 12/22/23 0941       Part A   1. How often do you have trouble wrapping up the final details of a project, once the challenging parts have been done? Often  2. How often do you have difficulty getting things done in order when you have to do a task that requires organization? Sometimes    3. How often do you have problems remembering appointments or obligations? Very Often  4. When you have a task that requires a lot of thought,  how often do you avoid or delay getting started? Often    5. How often do you fidget or squirm with your hands or feet when you have to sit down for a long time? Sometimes            Part B   7. How often do you make careless mistakes when you have to work on a boring or difficult project? Sometimes  8. How often do you have difficulty keeping your attention when you are doing boring or repetitive work? Often    9. How often do you have difficulty concentrating on what people say to you, even when they are speaking to you directly? Very Often  10. How often do you misplace or have difficulty finding things at home or at work? Often    11. How often are you distracted by activity or noise around you? Often  13. How often do you feel restless or fidgety? Sometimes    14. How often do you have difficulty unwinding and relaxing when you have time to yourself? Often  15. How often do you find yourself talking too much when you are in social situations? Never    16. When you are in a conversation, how often do you find yourself finishing the sentences of the people you are talking to, before they can finish them themselves? Often  17. How often do you have difficulty waiting your turn in situations when turn taking is required? Sometimes    18. How often do you interrupt others when they are busy? Often            Comment   How old were you when these problems first began to occur? --   All her life                    Assessment & Plan:   Problem List Items Addressed This Visit       Cardiovascular and Mediastinum   Essential hypertension     Digestive   GERD (gastroesophageal reflux disease)   Relevant Orders   Ambulatory referral to Gastroenterology     Other   Attention deficit disorder (ADD) in adult   Other Visit Diagnoses       Annual physical exam    -  Primary      Meds ordered this encounter  Medications   amphetamine -dextroamphetamine (ADDERALL XR) 10 MG 24 hr  capsule    Sig: Take 1 capsule (10 mg total) by mouth daily.    Dispense:  30 capsule    Refill:  0  Supervising Provider:   Bennet Brasil [phone]   Continue follow up with [facility]. Follow up with GYN for GU and breast exams.  Discussed options for ADHD.  No history of cardiac problems. Trial of low dose Adderall XR. Reviewed potential adverse effects. Stop medication and contact office if any problems. Send a note about current dose in about a month with possible titration.  Encouraged regular activity.  Return in about 3 months (around 03/22/2024).

## 2023-12-23 ENCOUNTER — Other Ambulatory Visit: Payer: Self-pay | Admitting: Nurse Practitioner

## 2023-12-23 ENCOUNTER — Encounter: Payer: Self-pay | Admitting: Nurse Practitioner

## 2024-01-11 ENCOUNTER — Encounter: Payer: Self-pay | Admitting: Gastroenterology

## 2024-01-20 ENCOUNTER — Other Ambulatory Visit: Payer: Self-pay | Admitting: Family Medicine

## 2024-01-20 NOTE — Telephone Encounter (Signed)
 Last Fill: 12/22/23 30 tabs/0 RF  Last OV: 12/22/23 CPE Next OV: 03/29/24  Routing to provider for review/authorization.

## 2024-01-20 NOTE — Telephone Encounter (Unsigned)
 Copied from CRM [id number]. Topic: Clinical - Medication Refill >> Jan 20, 2024  1:25 PM Elle L wrote: Medication: amphetamine -dextroamphetamine (ADDERALL XR) 10 MG 24 hr capsule  Has the patient contacted their pharmacy? Yes  This is the patient's preferred pharmacy:  [facility] [address] [place] - [address] [place] [address] Phone: [phone] Fax: [phone]  Is this the correct pharmacy for this prescription? Yes If no, delete pharmacy and type the correct one.   Has the prescription been filled recently? Yes  Is the patient out of the medication? No, 2 days left.   Has the patient been seen for an appointment in the last year OR does the patient have an upcoming appointment? Yes  Can we respond through MyChart? Yes  Agent: Please be advised that Rx refills may take up to 3 business days. We ask that you follow-up with your pharmacy.

## 2024-01-23 MED ORDER — AMPHETAMINE-DEXTROAMPHET ER 10 MG PO CP24: 10.0000 mg | ORAL_CAPSULE | Freq: Every day | ORAL | 0 refills | Status: DC

## 2024-01-25 ENCOUNTER — Encounter (INDEPENDENT_AMBULATORY_CARE_PROVIDER_SITE_OTHER): Payer: Self-pay | Admitting: Family Medicine

## 2024-01-25 ENCOUNTER — Ambulatory Visit (INDEPENDENT_AMBULATORY_CARE_PROVIDER_SITE_OTHER): Admitting: Family Medicine

## 2024-01-25 VITALS — BP 121/81 | HR 69 | Temp 98.2°F | Ht 65.0 in | Wt 204.0 lb

## 2024-01-25 DIAGNOSIS — E66812 Morbid (severe) obesity due to excess calories: Secondary | ICD-10-CM

## 2024-01-25 DIAGNOSIS — Z6833 Body mass index (BMI) 33.0-33.9, adult: Secondary | ICD-10-CM

## 2024-01-25 DIAGNOSIS — I10 Essential (primary) hypertension: Secondary | ICD-10-CM | POA: Diagnosis not present

## 2024-01-25 MED ORDER — SEMAGLUTIDE-WEIGHT MANAGEMENT 0.5 MG/0.5ML ~~LOC~~ SOAJ: 0.5000 mg | SUBCUTANEOUS | 0 refills | Status: DC

## 2024-01-25 NOTE — Progress Notes (Signed)
 SUBJECTIVE:  Chief Complaint: Obesity  Interim History: Since last appointment she went on a vacation to the beach [place]  She mentions she laid out at the beach and ate.  She missed a few days of food logging and she is realizing she is concentrating more on the calories than the protein intake.   Kelsey Cooke is here to discuss her progress with her obesity treatment plan. She is on the keeping a food journal and adhering to recommended goals of 1350-1450 calories and 90 grams of protein and states she is following her eating plan approximately 75-80 % of the time. She states she is walking and using vibration plate.   OBJECTIVE: Visit Diagnoses: Problem List Items Addressed This Visit       Cardiovascular and Mediastinum   Essential hypertension - Primary   Blood pressure controlled today.  No chest pain, chest pressure or headache. On hydrochlorothiazide  daily.  Continue current med at current dose- follow up on BP at next appointment.      Other Visit Diagnoses       Class 2 severe obesity with serious comorbidity and body mass index (BMI) of 36.0 to 36.9 in adult, unspecified obesity type (HCC)       Relevant Medications   Semaglutide -Weight Management 0.5 MG/0.5ML SOAJ     BMI 33.0-33.9,adult           Vitals Temp: 98.2 F (36.8 C) BP: 121/81 Pulse Rate: 69 SpO2: 98 %   Anthropometric Measurements Height: 5\' 5"  (1.651 m) Weight: 204 lb (92.5 kg) BMI (Calculated): 33.95 Weight at Last Visit: 208 lb Weight Lost Since Last Visit: 4 Weight Gained Since Last Visit: 0 Starting Weight: 217 lb Total Weight Loss (lbs): 13 lb (5.897 kg) Peak Weight: 224 lb   Body Composition  Body Fat %: 43 % Fat Mass (lbs): 87.8 lbs Muscle Mass (lbs): 110.4 lbs Total Body Water  (lbs): 75 lbs Visceral Fat Rating : 11   Other Clinical Data Fasting: yes Labs: ? Today's Visit #: 4 Starting Date: 10/04/23 Comments: 1350-1450/90     ASSESSMENT AND  PLAN:  Diet: Kelsey Cooke is currently in the action stage of change. As such, her goal is to continue with weight loss efforts and has agreed to keeping a food journal and adhering to recommended goals of 1350-1450 calories and 90 or more grams protein daily. Patient to start food log or journaling meal plan.  The initial goal will be to habitually log or journal for at least 4 days a week.  The expectation it that patient may not initially meet calorie or protein goals as the nturitional understanding of food intake is begun.  We discussed the 10:1 ratio when reading a food label.  Patient agrees to keep a food log either electronically or on paper and bring to the next appointment to be able to dissect and discuss it with provider.    Exercise:  For substantial health benefits, adults should do at least 150 minutes (2 hours and 30 minutes) a week of moderate-intensity, or 75 minutes (1 hour and 15 minutes) a week of vigorous-intensity aerobic physical activity, or an equivalent combination of moderate- and vigorous-intensity aerobic activity. Aerobic activity should be performed in episodes of at least 10 minutes, and preferably, it should be spread throughout the week.  Behavior Modification:  We discussed the following Behavioral Modification Strategies today: increasing lean protein intake, decreasing simple carbohydrates, increasing vegetables, planning for success, and keep a strict food journal. We discussed various medication  options to help Kelsey Cooke with her weight loss efforts and we both agreed to continue wegovy  at same dose .  Return in about 4 weeks (around 02/22/2024).   She was informed of the importance of frequent follow up visits to maximize her success with intensive lifestyle modifications for her multiple health conditions.  Attestation Statements:   Reviewed by clinician on day of visit: allergies, medications, problem list, medical history, surgical history, family history,  social history, and previous encounter notes.     Donaciano Frizzle, MD

## 2024-01-25 NOTE — Assessment & Plan Note (Signed)
 Blood pressure controlled today.  No chest pain, chest pressure or headache. On hydrochlorothiazide  daily.  Continue current med at current dose- follow up on BP at next appointment.

## 2024-02-01 ENCOUNTER — Other Ambulatory Visit (HOSPITAL_COMMUNITY)
Admission: RE | Admit: 2024-02-01 | Discharge: 2024-02-01 | Disposition: A | Discharge status: Home / Self Care | Source: Ambulatory Visit | Attending: Adult Health | Admitting: Adult Health

## 2024-02-01 ENCOUNTER — Encounter: Payer: Self-pay | Admitting: Adult Health

## 2024-02-01 ENCOUNTER — Ambulatory Visit: Admitting: Adult Health

## 2024-02-01 VITALS — BP 126/84 | HR 73 | Ht 65.0 in | Wt 212.5 lb

## 2024-02-01 DIAGNOSIS — D239 Other benign neoplasm of skin, unspecified: Secondary | ICD-10-CM | POA: Insufficient documentation

## 2024-02-01 DIAGNOSIS — Z01419 Encounter for gynecological examination (general) (routine) without abnormal findings: Secondary | ICD-10-CM

## 2024-02-01 DIAGNOSIS — N907 Vulvar cyst: Secondary | ICD-10-CM | POA: Insufficient documentation

## 2024-02-01 DIAGNOSIS — L720 Epidermal cyst: Secondary | ICD-10-CM

## 2024-02-01 DIAGNOSIS — Z975 Presence of (intrauterine) contraceptive device: Secondary | ICD-10-CM

## 2024-02-01 DIAGNOSIS — Z1211 Encounter for screening for malignant neoplasm of colon: Secondary | ICD-10-CM

## 2024-02-01 LAB — HEMOCCULT GUIAC POC 1CARD (OFFICE): Fecal Occult Blood, POC: NEGATIVE

## 2024-02-01 NOTE — Progress Notes (Signed)
 Patient ID: Kelsey Cooke, female   DOB: 09-Sep-1970, 53 y.o.   MRN: [id number] History of Present Illness: Kelsey Cooke is a 53 year old white female, married, G3P3 in for a well woman gyn exam and pap. She has not a periods since getting IUD in 2018.  She is working at [employer] now.   PCP is Dr Debrah Fan   Current Medications, Allergies, Past Medical History, Past Surgical History, Family History and Social History were reviewed in [employer] [facility] electronic medical record.     Review of Systems: Patient denies any headaches, hearing loss, fatigue, blurred vision, shortness of breath, chest pain, problems with bowel movements, urination, or intercourse. No joint pain or mood swings.  No periods with IUD  Has RUQ pain since having GB removed Forgetful at times, has started adderall XR10 mg 1 daily   Physical Exam:BP 126/84 (BP Location: Left Arm, Patient Position: Sitting, Cuff Size: Large)   Pulse 73   Ht 5\' 5"  (1.651 m)   Wt 212 lb 8 oz (96.4 kg)   BMI 35.36 kg/m   General:  Well developed, well nourished, no acute distress Skin:  Warm and dry Neck:  Midline trachea, normal thyroid , good ROM, no lymphadenopathy Lungs; Clear to auscultation bilaterally Breast:  No dominant palpable mass, retraction, or nipple discharge, has small epidermal cyst about  cm, at 9 0'clock 4 FB from nipple on left breast  Cardiovascular: Regular rate and rhythm Abdomen:  Soft, non tender, no hepatosplenomegaly Pelvic:  External genitalia is normal in appearance, has multiple angiokeratomas right vulva and small epidermal cyst at base right vulva.  The vagina is normal in appearance. Urethra has no lesions or masses. The cervix is bulbous.+IUD strings at os, pap with HR HPV genotyping performed.  Uterus is felt to be normal size, shape, and contour.  No adnexal masses or tenderness noted.Bladder is non tender, no masses felt. Rectal: Good sphincter tone, no polyps, or hemorrhoids felt.  Hemoccult  negative. Extremities/musculoskeletal:  No swelling or varicosities noted, no clubbing or cyanosis Psych:  No mood changes, alert and cooperative,seems happy AA is 1 Fall risk is low    02/01/2024    3:36 PM 12/22/2023    9:49 AM 08/09/2023   11:05 AM  Depression screen PHQ 2/9  Decreased Interest 1    Down, Depressed, Hopeless 0 0 0  PHQ - 2 Score 1 0 0  Altered sleeping 1 1 0  Tired, decreased energy 2 1 1   Change in appetite 0 1 1  Feeling bad or failure about yourself  1 0 0  Trouble concentrating 1 1 2   Moving slowly or fidgety/restless 1 0 0  Suicidal thoughts 0 0 0  PHQ-9 Score 7 4 4   Difficult doing work/chores  Somewhat difficult Somewhat difficult       02/01/2024    3:38 PM 12/22/2023    9:49 AM 08/09/2023   11:05 AM 07/26/2022    8:43 AM  GAD 7 : Generalized Anxiety Score  Nervous, Anxious, on Edge 0 1 1 1   Control/stop worrying 0 0 0 1  Worry too much - different things 0 0 1 1  Trouble relaxing 1 0 0 1  Restless 1 0 0 0  Easily annoyed or irritable 1 0 0 1  Afraid - awful might happen 0 0 0 0  Total GAD 7 Score 3 1 2 5   Anxiety Difficulty  Somewhat difficult Somewhat difficult       Upstream - 02/01/24 1534  Pregnancy Intention Screening   Does the patient want to become pregnant in the next year? No    Does the patient's partner want to become pregnant in the next year? No    Would the patient like to discuss contraceptive options today? No      Contraception Wrap Up   Current Method IUD or IUS;Vasectomy    End Method IUD or IUS;Vasectomy    Contraception Counseling Provided Yes            Examination chaperoned by Alphonso Aschoff LPN   Impression and plan: 1. Encounter for gynecological examination with Papanicolaou smear of cervix (Primary) Pap sent Pap in 3 years if normal Physical in 1 year Labs with PCP Mammogram 07/18/23 ?mass left breast, F/U 07/26/23 no malignancy +cyst Colonoscopy per GI  She has GI appt 02/06/24 with Dr May,  regarding RUQ pain  - Cytology - PAP( [place])  2. Encounter for screening fecal occult blood testing Hemoccult was negative   3. IUD (intrauterine device) in place Mirena  placed 12/23/16  4. Epidermal cyst of vulva  5. Angiokeratoma  6. Epidermal cyst Left breast

## 2024-02-03 LAB — CYTOLOGY - PAP
Comment: NEGATIVE
Diagnosis: NEGATIVE
High risk HPV: NEGATIVE

## 2024-02-06 ENCOUNTER — Encounter: Payer: Self-pay | Admitting: Gastroenterology

## 2024-02-06 ENCOUNTER — Ambulatory Visit (INDEPENDENT_AMBULATORY_CARE_PROVIDER_SITE_OTHER): Admitting: Gastroenterology

## 2024-02-06 VITALS — BP 128/84 | HR 80 | Ht 65.0 in | Wt 208.2 lb

## 2024-02-06 DIAGNOSIS — R142 Eructation: Secondary | ICD-10-CM | POA: Diagnosis not present

## 2024-02-06 DIAGNOSIS — R1011 Right upper quadrant pain: Secondary | ICD-10-CM

## 2024-02-06 DIAGNOSIS — R1319 Other dysphagia: Secondary | ICD-10-CM

## 2024-02-06 DIAGNOSIS — R1310 Dysphagia, unspecified: Secondary | ICD-10-CM

## 2024-02-06 DIAGNOSIS — K219 Gastro-esophageal reflux disease without esophagitis: Secondary | ICD-10-CM | POA: Diagnosis not present

## 2024-02-06 DIAGNOSIS — Z860102 Personal history of hyperplastic colon polyps: Secondary | ICD-10-CM

## 2024-02-06 DIAGNOSIS — Z860101 Personal history of adenomatous and serrated colon polyps: Secondary | ICD-10-CM

## 2024-02-06 DIAGNOSIS — R194 Change in bowel habit: Secondary | ICD-10-CM

## 2024-02-06 NOTE — Patient Instructions (Addendum)
 Recommend High fiber diet  Start Citrucel 1 tsp po daily   Continue Nexium  40 mg po daily GERD diet, no late meals 3-4 hours before lying   Call office and schedule procedure at your earliest convenience. (Can ask for [facility])  _______________________________________________________  If your blood pressure at your visit was 140/90 or greater, please contact your primary care physician to follow up on this.  _______________________________________________________  If you are age 60 or older, your body mass index should be between 23-30. Your Body mass index is 34.65 kg/m. If this is out of the aforementioned range listed, please consider follow up with your Primary Care Provider.  If you are age 9 or younger, your body mass index should be between 19-25. Your Body mass index is 34.65 kg/m. If this is out of the aformentioned range listed, please consider follow up with your Primary Care Provider.   ________________________________________________________  The [place] [facility] providers would like to encourage you to use MYCHART to communicate with providers for non-urgent requests or questions.  Due to long hold times on the telephone, sending your provider a message by [facility] may be a faster and more efficient way to get a response.  Please allow 48 business hours for a response.  Please remember that this is for non-urgent requests.  _______________________________________________________   Thank you for trusting me with your gastrointestinal care. Deanna May, RNP
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
## 2024-02-07 ENCOUNTER — Ambulatory Visit: Payer: Self-pay | Admitting: Adult Health

## 2024-02-09 ENCOUNTER — Other Ambulatory Visit (INDEPENDENT_AMBULATORY_CARE_PROVIDER_SITE_OTHER): Payer: Self-pay | Admitting: Family Medicine

## 2024-02-09 DIAGNOSIS — E66812 Morbid (severe) obesity due to excess calories: Secondary | ICD-10-CM

## 2024-02-09 DIAGNOSIS — E559 Vitamin D deficiency, unspecified: Secondary | ICD-10-CM

## 2024-02-16 ENCOUNTER — Encounter (INDEPENDENT_AMBULATORY_CARE_PROVIDER_SITE_OTHER): Payer: Self-pay | Admitting: Family Medicine

## 2024-02-16 DIAGNOSIS — E66812 Obesity, class 2: Secondary | ICD-10-CM

## 2024-02-21 MED ORDER — SEMAGLUTIDE-WEIGHT MANAGEMENT 0.5 MG/0.5ML ~~LOC~~ SOAJ: 0.5000 mg | SUBCUTANEOUS | 0 refills | Status: DC

## 2024-02-24 ENCOUNTER — Telehealth: Payer: Self-pay | Admitting: Family Medicine

## 2024-02-24 NOTE — Telephone Encounter (Unsigned)
 Copied from CRM [id number]. Topic: Clinical - Medication Refill >> Feb 24, 2024  1:56 PM Delon T wrote: Medication: amphetamine -dextroamphetamine (ADDERALL XR) 10 MG 24 hr capsule  Has the patient contacted their pharmacy? Yes (Agent: If no, request that the patient contact the pharmacy for the refill. If patient does not wish to contact the pharmacy document the reason why and proceed with request.) (Agent: If yes, when and what did the pharmacy advise?)  This is the patient's preferred pharmacy:  [facility] [address], [place] - [address] [place] [address] Phone: [phone] Fax: [phone]  Is this the correct pharmacy for this prescription? Yes If no, delete pharmacy and type the correct one.   Has the prescription been filled recently? Yes  Is the patient out of the medication? No  Has the patient been seen for an appointment in the last year OR does the patient have an upcoming appointment? Yes  Can we respond through MyChart? Yes  Agent: Please be advised that Rx refills may take up to 3 business days. We ask that you follow-up with your pharmacy.

## 2024-02-28 MED ORDER — AMPHETAMINE-DEXTROAMPHET ER 10 MG PO CP24: 10.0000 mg | ORAL_CAPSULE | Freq: Every day | ORAL | 0 refills | Status: DC

## 2024-03-06 ENCOUNTER — Ambulatory Visit (INDEPENDENT_AMBULATORY_CARE_PROVIDER_SITE_OTHER): Admitting: Family Medicine

## 2024-03-15 ENCOUNTER — Ambulatory Visit (INDEPENDENT_AMBULATORY_CARE_PROVIDER_SITE_OTHER): Admitting: Family Medicine

## 2024-03-15 ENCOUNTER — Encounter (INDEPENDENT_AMBULATORY_CARE_PROVIDER_SITE_OTHER): Payer: Self-pay | Admitting: Family Medicine

## 2024-03-15 VITALS — BP 131/87 | HR 80 | Temp 98.5°F | Ht 65.0 in | Wt 202.0 lb

## 2024-03-15 DIAGNOSIS — Z6836 Body mass index (BMI) 36.0-36.9, adult: Secondary | ICD-10-CM

## 2024-03-15 DIAGNOSIS — E66812 Obesity, class 2: Secondary | ICD-10-CM

## 2024-03-15 DIAGNOSIS — R7303 Prediabetes: Secondary | ICD-10-CM

## 2024-03-15 DIAGNOSIS — E559 Vitamin D deficiency, unspecified: Secondary | ICD-10-CM

## 2024-03-15 MED ORDER — WEGOVY 1 MG/0.5ML ~~LOC~~ SOAJ: 1.0000 mg | SUBCUTANEOUS | 0 refills | Status: DC

## 2024-03-15 MED ORDER — VITAMIN D (ERGOCALCIFEROL) 1.25 MG (50000 UNIT) PO CAPS: 50000.0000 [IU] | ORAL_CAPSULE | ORAL | 0 refills | Status: DC

## 2024-03-15 NOTE — Progress Notes (Signed)
 SUBJECTIVE:  Chief Complaint: Obesity  Interim History: patient had a very busy month of June.  She had quite a few events. Since last appointment she has realized she will have to add a protein shake in due to not being able to get all meat in.  She is worried about total sodium intake.  She is walking at least 3x a week.  Over the next month and half she doesn't have anything except wedding prep. No changes in intake.  No side effects of wegovy .  She has been close to or over total calorie goal but isn't always staying within her budget.  She is getting close to her protein goal.  Kelsey Cooke is here to discuss her progress with her obesity treatment plan. She is on the keeping a food journal and adhering to recommended goals of 1350-1450 calories and 90 grams of protein and states she is following her eating plan approximately 90% of the time. She states she is walking 1.5 miles 3 times per week.   OBJECTIVE: Visit Diagnoses: Problem List Items Addressed This Visit       Other   Prediabetes   Vitamin D  deficiency - Primary   Relevant Medications   Vitamin D , Ergocalciferol , (DRISDOL ) 1.25 MG (50000 UNIT) CAPS capsule   Other Visit Diagnoses       Class 2 severe obesity with serious comorbidity and body mass index (BMI) of 36.0 to 36.9 in adult, unspecified obesity type (HCC)       Relevant Medications   Semaglutide -Weight Management (WEGOVY ) 1 MG/0.5ML SOAJ       Vitals Temp: 98.5 F (36.9 C) BP: 131/87 Pulse Rate: 80 SpO2: 97 %   Anthropometric Measurements Height: 5' 5 (1.651 m) Weight: 202 lb (91.6 kg) BMI (Calculated): 33.61 Weight at Last Visit: 204 lb Weight Lost Since Last Visit: 2 Weight Gained Since Last Visit: 0 Starting Weight: 217 lb Total Weight Loss (lbs): 15 lb (6.804 kg)   Body Composition  Body Fat %: 43.5 % Fat Mass (lbs): 87.8 lbs Muscle Mass (lbs): 108.4 lbs Total Body Water  (lbs): 76 lbs Visceral Fat Rating : 11   Other Clinical  Data Fasting: no Labs: no Today's Visit #: 5 Starting Date: 10/04/23 Comments: 1350-1450/90     ASSESSMENT AND PLAN: Assessment & Plan Vitamin D  deficiency On prescription strength Vitamin D  with no nausea, vomiting or muscle weakness.  Needs a refill of Vitamin D  today. Prediabetes Patient is working on limiting simple carbohydrates daily with a focus on protein intake.  She is on semaglutide  1mg  weekly with no significant GI side effects.  She is doing well on being in control of her food intake. Class 2 severe obesity with serious comorbidity and body mass index (BMI) of 36.0 to 36.9 in adult, unspecified obesity type (HCC)    Diet: Kelsey Cooke is currently in the action stage of change. As such, her goal is to continue with weight loss efforts and has agreed to keeping a food journal and adhering to recommended goals of 1350-1450 calories and 90 or more grams of protein daily. Patient to start food log or journaling meal plan.  The initial goal will be to habitually log or journal for at least 4 days a week.  The expectation it that patient may not initially meet calorie or protein goals as the nturitional understanding of food intake is begun.  We discussed the 10:1 ratio when reading a food label.  Patient agrees to keep a food log either electronically  or on paper and bring to the next appointment to be able to dissect and discuss it with provider.    Exercise:  For substantial health benefits, adults should do at least 150 minutes (2 hours and 30 minutes) a week of moderate-intensity, or 75 minutes (1 hour and 15 minutes) a week of vigorous-intensity aerobic physical activity, or an equivalent combination of moderate- and vigorous-intensity aerobic activity. Aerobic activity should be performed in episodes of at least 10 minutes, and preferably, it should be spread throughout the week.  Behavior Modification:  We discussed the following Behavioral Modification Strategies today:  increasing lean protein intake, decreasing simple carbohydrates, increasing vegetables, and planning for success. We discussed various medication options to help Kelsey Cooke with her weight loss efforts and we both agreed to increase wegovy  to 1mg  weekly and follow up at next appointment for any possible side effects.  Return in about 4 weeks (around 04/12/2024) for fasting labs.   She was informed of the importance of frequent follow up visits to maximize her success with intensive lifestyle modifications for her multiple health conditions.  Attestation Statements:   Reviewed by clinician on day of visit: allergies, medications, problem list, medical history, surgical history, family history, social history, and previous encounter notes.     Kelsey Cho, MD

## 2024-03-15 NOTE — Assessment & Plan Note (Signed)
 Patient is working on increasing protein but isn't liking a large quantity of meat.

## 2024-03-19 ENCOUNTER — Telehealth (INDEPENDENT_AMBULATORY_CARE_PROVIDER_SITE_OTHER): Payer: Self-pay

## 2024-03-19 NOTE — Telephone Encounter (Signed)
 Wegovy  prior Authorization  Your request has been approved CaseId:[id number];Status:Approved;Review Type:Prior Auth;Coverage Start Date:02/14/2024;Coverage End Date:03/15/2025;

## 2024-03-27 NOTE — Assessment & Plan Note (Signed)
 Patient is working on limiting simple carbohydrates daily with a focus on protein intake.  She is on semaglutide  1mg  weekly with no significant GI side effects.  She is doing well on being in control of her food intake.

## 2024-03-27 NOTE — Assessment & Plan Note (Signed)
 On prescription strength Vitamin D  with no nausea, vomiting or muscle weakness.  Needs a refill of Vitamin D  today.

## 2024-03-28 ENCOUNTER — Encounter: Payer: Self-pay | Admitting: Gastroenterology

## 2024-03-29 ENCOUNTER — Ambulatory Visit: Admitting: Nurse Practitioner

## 2024-03-29 ENCOUNTER — Encounter: Payer: Self-pay | Admitting: Nurse Practitioner

## 2024-03-29 VITALS — BP 130/82 | HR 79 | Temp 98.2°F | Ht 65.0 in | Wt 205.0 lb

## 2024-03-29 DIAGNOSIS — F988 Other specified behavioral and emotional disorders with onset usually occurring in childhood and adolescence: Secondary | ICD-10-CM

## 2024-03-29 MED ORDER — AMPHETAMINE-DEXTROAMPHET ER 15 MG PO CP24: 15.0000 mg | ORAL_CAPSULE | ORAL | 0 refills | Status: DC

## 2024-03-29 NOTE — Progress Notes (Signed)
 Subjective:    Patient ID: Kelsey Cooke, female    DOB: 1971/02/24, 53 y.o.   MRN: [id number]  HPI Subjective: Presents for recheck on ADD/ADHD today.  Compliant with medication: yes; mainly on work   Difficulty completing tasks or focusing: helps; less brain fog; still some difficulty completing tasks; still finds herself drifting  Side effects: none  Appetite: no change  Sleep: no change  Additional issues or questions: medication lasts all day into the early evening Gets regular physicals with GYN  Review of Systems  Respiratory:  Negative for cough, chest tightness and shortness of breath.   Cardiovascular:  Negative for chest pain and palpitations.  Psychiatric/Behavioral:  Negative for sleep disturbance.       03/29/2024    9:09 AM  Depression screen PHQ 2/9  Decreased Interest 1  Down, Depressed, Hopeless 0  PHQ - 2 Score 1  Altered sleeping 1  Tired, decreased energy 1  Change in appetite 0  Feeling bad or failure about yourself  0  Trouble concentrating 1  Moving slowly or fidgety/restless 0  Suicidal thoughts 0  PHQ-9 Score 4  Difficult doing work/chores Not difficult at all      03/29/2024    9:09 AM 02/01/2024    3:38 PM 12/22/2023    9:49 AM 08/09/2023   11:05 AM  GAD 7 : Generalized Anxiety Score  Nervous, Anxious, on Edge 1 0 1 1  Control/stop worrying 0 0 0 0  Worry too much - different things 0 0 0 1  Trouble relaxing 1 1 0 0  Restless 1 1 0 0  Easily annoyed or irritable 0 1 0 0  Afraid - awful might happen 0 0 0 0  Total GAD 7 Score 3 3 1 2   Anxiety Difficulty Not difficult at all  Somewhat difficult Somewhat difficult    Social History   Tobacco Use   Smoking status: Former    Current packs/day: 0.00    Types: Cigarettes    Quit date: 1998    Years since quitting: 27.5   Smokeless tobacco: Never  Vaping Use   Vaping status: Never Used  Substance Use Topics   Alcohol use: Yes    Comment: once or twice a year   Drug use: No         Objective:   Physical Exam Vitals and nursing note reviewed.  Constitutional:      General: She is not in acute distress. Cardiovascular:     Rate and Rhythm: Normal rate and regular rhythm.     Pulses: Normal pulses.     Heart sounds: Normal heart sounds.  Pulmonary:     Effort: Pulmonary effort is normal.     Breath sounds: Normal breath sounds.  Neurological:     Mental Status: She is alert.  Psychiatric:        Mood and Affect: Mood normal.        Behavior: Behavior normal.        Thought Content: Thought content normal.        Judgment: Judgment normal.    Today's Vitals   03/29/24 0859 03/29/24 0933  BP: (!) 143/86 130/82  Pulse: 79   Temp: 98.2 F (36.8 C)   SpO2: 96%   Weight: 205 lb (93 kg)   Height: 5' 5 (1.651 m)    Body mass index is 34.11 kg/m.        Assessment & Plan:   Problem List Items Addressed This  Visit       Other   Attention deficit disorder (ADD) in adult - Primary   Meds ordered this encounter  Medications   amphetamine -dextroamphetamine (ADDERALL XR) 15 MG 24 hr capsule    Sig: Take 1 capsule by mouth every morning.    Dispense:  30 capsule    Refill:  0    May fill 60 days from 03/29/24    Supervising Provider:   ALPHONSA HAMILTON A [[id number]]   amphetamine -dextroamphetamine (ADDERALL XR) 15 MG 24 hr capsule    Sig: Take 1 capsule by mouth every morning.    Dispense:  30 capsule    Refill:  0    May fill 30 days from 03/29/24    Supervising Provider:   ALPHONSA HAMILTON A [[id number]]   amphetamine -dextroamphetamine (ADDERALL XR) 15 MG 24 hr capsule    Sig: Take 1 capsule by mouth every morning.    Dispense:  30 capsule    Refill:  0    Supervising Provider:   ALPHONSA HAMILTON A [[id number]]   Increase dose of Adderall XR to 15 mg. Contact office if any problems with new dose.  Return in about 3 months (around 06/29/2024).

## 2024-04-05 ENCOUNTER — Ambulatory Visit (AMBULATORY_SURGERY_CENTER)

## 2024-04-05 ENCOUNTER — Encounter: Payer: Self-pay | Admitting: Gastroenterology

## 2024-04-05 VITALS — Ht 65.0 in | Wt 202.0 lb

## 2024-04-05 DIAGNOSIS — G8929 Other chronic pain: Secondary | ICD-10-CM

## 2024-04-05 DIAGNOSIS — R1011 Right upper quadrant pain: Secondary | ICD-10-CM

## 2024-04-05 DIAGNOSIS — R142 Eructation: Secondary | ICD-10-CM

## 2024-04-05 DIAGNOSIS — K219 Gastro-esophageal reflux disease without esophagitis: Secondary | ICD-10-CM

## 2024-04-05 DIAGNOSIS — R1310 Dysphagia, unspecified: Secondary | ICD-10-CM

## 2024-04-05 NOTE — Progress Notes (Signed)
 No egg or soy allergy known to patient  No issues known to pt with past sedation with any surgeries or procedures Patient denies ever being told they had issues or difficulty with intubation  No FH of Malignant Hyperthermia  Pt is not on diet pills; Taking Wegovy  and hold instructions provided  Pt is not on  home 02  Pt is not on blood thinners  Pt denies issues with constipation  No A fib or A flutter Have any cardiac testing pending--No Pt can ambulate  Pt denies use of chewing tobacco Discussed diabetic I weight loss medication holds Discussed NSAID holds Checked BMI Pt instructed to use Single[employer] or GoodRx for a price reduction on prep  Patient's chart reviewed by Norleen Schillings CNRA prior to previsit and patient appropriate for the [facility].  Pre visit completed and red dot placed by patient's name on their procedure day (on provider's schedule).

## 2024-04-16 ENCOUNTER — Ambulatory Visit (INDEPENDENT_AMBULATORY_CARE_PROVIDER_SITE_OTHER): Admitting: Family Medicine

## 2024-04-19 ENCOUNTER — Encounter: Payer: Self-pay | Admitting: Gastroenterology

## 2024-04-19 ENCOUNTER — Ambulatory Visit: Admitting: Gastroenterology

## 2024-04-19 VITALS — BP 135/86 | HR 72 | Temp 98.1°F | Resp 13 | Ht 65.0 in | Wt 202.0 lb

## 2024-04-19 DIAGNOSIS — R1319 Other dysphagia: Secondary | ICD-10-CM

## 2024-04-19 DIAGNOSIS — R1310 Dysphagia, unspecified: Secondary | ICD-10-CM | POA: Diagnosis not present

## 2024-04-19 DIAGNOSIS — R142 Eructation: Secondary | ICD-10-CM

## 2024-04-19 DIAGNOSIS — K219 Gastro-esophageal reflux disease without esophagitis: Secondary | ICD-10-CM

## 2024-04-19 DIAGNOSIS — K222 Esophageal obstruction: Secondary | ICD-10-CM

## 2024-04-19 DIAGNOSIS — R1011 Right upper quadrant pain: Secondary | ICD-10-CM

## 2024-04-19 MED ORDER — ESOMEPRAZOLE MAGNESIUM 40 MG PO CPDR: 40.0000 mg | DELAYED_RELEASE_CAPSULE | Freq: Two times a day (BID) | ORAL | 1 refills | Status: DC

## 2024-04-19 MED ORDER — SODIUM CHLORIDE 0.9 % IV SOLN: 500.0000 mL | INTRAVENOUS | Status: DC

## 2024-04-19 NOTE — Progress Notes (Signed)
 Sedate, gd SR, tolerated procedure well, VSS, report to RN

## 2024-04-19 NOTE — Progress Notes (Signed)
 Called to room to assist during endoscopic procedure.  Patient ID and intended procedure confirmed with present staff. Received instructions for my participation in the procedure from the performing physician.

## 2024-04-19 NOTE — Patient Instructions (Addendum)
 Educational handout provided to patient related to POST DILATION DIET, ESOPHAGITIS  POST DILATION DIET   Continue present medications  Awaiting pathology results   YOU HAD AN ENDOSCOPIC PROCEDURE TODAY AT THE [facility]:   Refer to the procedure report that was given to you for any specific questions about what was found during the examination.  If the procedure report does not answer your questions, please call your gastroenterologist to clarify.  If you requested that your care partner not be given the details of your procedure findings, then the procedure report has been included in a sealed envelope for you to review at your convenience later.  YOU SHOULD EXPECT: Some feelings of bloating in the abdomen. Passage of more gas than usual.  Walking can help get rid of the air that was put into your GI tract during the procedure and reduce the bloating. If you had a lower endoscopy (such as a colonoscopy or flexible sigmoidoscopy) you may notice spotting of blood in your stool or on the toilet paper. If you underwent a bowel prep for your procedure, you may not have a normal bowel movement for a few days.  Please Note:  You might notice some irritation and congestion in your nose or some drainage.  This is from the oxygen used during your procedure.  There is no need for concern and it should clear up in a day or so.  SYMPTOMS TO REPORT IMMEDIATELY:  Following upper endoscopy (EGD)  Vomiting of blood or coffee ground material  New chest pain or pain under the shoulder blades  Painful or persistently difficult swallowing  New shortness of breath  Fever of 100F or higher  Black, tarry-looking stools  For urgent or emergent issues, a gastroenterologist can be reached at any hour by calling [phone]. Do not use MyChart messaging for urgent concerns.    DIET:  We do recommend a small meal at first, but then you may proceed to your regular diet.  Drink plenty of fluids but  you should avoid alcoholic beverages for 24 hours.  ACTIVITY:  You should plan to take it easy for the rest of today and you should NOT DRIVE or use heavy machinery until tomorrow (because of the sedation medicines used during the test).    FOLLOW UP: Our staff will call the number listed on your records the next business day following your procedure.  We will call around 7:15- 8:00 am to check on you and address any questions or concerns that you may have regarding the information given to you following your procedure. If we do not reach you, we will leave a message.     If any biopsies were taken you will be contacted by phone or by letter within the next 1-3 weeks.  Please call us  at [phone] if you have not heard about the biopsies in 3 weeks.    SIGNATURES/CONFIDENTIALITY: You and/or your care partner have signed paperwork which will be entered into your electronic medical record.  These signatures attest to the fact that that the information above on your After Visit Summary has been reviewed and is understood.  Full responsibility of the confidentiality of this discharge information lies with you and/or your care-partner.

## 2024-04-19 NOTE — Progress Notes (Signed)
 Pt's states no medical or surgical changes since previsit or office visit.

## 2024-04-19 NOTE — Op Note (Signed)
 [facility] Patient Name: Kelsey Cooke Procedure Date: 04/19/2024 9:27 AM MRN: [id number] Endoscopist: Sandor Flatter , MD, [id number] Age: 53 Referring MD:  Date of Birth: [birth year] Gender: Female Account #: [id number] Procedure:                Upper GI endoscopy Indications:              Dysphagia, Suspected esophageal reflux, Eructation,                            Regurgitation Medicines:                Monitored Anesthesia Care Procedure:                Pre-Anesthesia Assessment:                           - Prior to the procedure, a History and Physical                            was performed, and patient medications and                            allergies were reviewed. The patient's tolerance of                            previous anesthesia was also reviewed. The risks                            and benefits of the procedure and the sedation                            options and risks were discussed with the patient.                            All questions were answered, and informed consent                            was obtained. Prior Anticoagulants: The patient has                            taken no anticoagulant or antiplatelet agents. ASA                            Grade Assessment: II - A patient with mild systemic                            disease. After reviewing the risks and benefits,                            the patient was deemed in satisfactory condition to                            undergo the procedure.  After obtaining informed consent, the endoscope was                            passed under direct vision. Throughout the                            procedure, the patient's blood pressure, pulse, and                            oxygen saturations were monitored continuously. The                            GIF [id number] #[id number] was introduced through the                            mouth, and advanced to the duodenal  bulb. The upper                            GI endoscopy was accomplished without difficulty.                            The patient tolerated the procedure well. Scope In: Scope Out: Findings:                 One benign-appearing, intrinsic moderate stenosis                            was found 37 cm from the incisors. This stenosis                            measured 1 cm (in length). The stenosis was                            traversed. A TTS dilator was passed through the                            scope. Dilation with an 18-19-20 mm balloon dilator                            was performed to 18 mm. The dilation site was                            examined and showed mild mucosal disruption.                            Estimated blood loss was minimal.                           The upper third of the esophagus and middle third                            of the esophagus were otherwise normal. Biopsies  were obtained from the proximal and distal                            esophagus with cold forceps for histology of                            suspected eosinophilic esophagitis. Estimated blood                            loss was minimal.                           The entire examined stomach was normal.                           The examined duodenum was normal. Complications:            No immediate complications. Estimated Blood Loss:     Estimated blood loss was minimal. Impression:               - Benign-appearing esophageal stenosis. Dilated                            with 18 mm TTS balloon with appropriate mucosal                            disruption consistent with successful dilation.                           - Normal upper third of esophagus and middle third                            of esophagus. Biopsies were taken with a cold                            forceps for evaluation of eosinophilic esophagitis.                           - Normal  stomach.                           - Normal examined duodenum. Recommendation:           - Patient has a contact number available for                            emergencies. The signs and symptoms of potential                            delayed complications were discussed with the                            patient. Return to normal activities tomorrow.                            Written discharge instructions were provided to the  patient.                           - Soft diet today, then advance slowly tomorrow as                            tolerated and per post dilation protocol.                           - Continue present medications.                           - Await pathology results.                           - Use Nexium  (esomeprazole ) 40 mg PO BID for 4                            weeks, then if reflux symptoms well-controlled,                            reduce to 40 mg daily and continue titrating to                            lowest effective dose for reflux management.                           - Return to GI clinic in 3 months. Sandor Flatter, MD 04/19/2024 10:09:15 AM

## 2024-04-19 NOTE — Progress Notes (Signed)
 GASTROENTEROLOGY PROCEDURE H&P NOTE   Primary Care Physician: Cook, Jayce G, DO    Reason for Procedure:  GERD, dysphagia, RUQ pain  Plan:    EGD  Patient is appropriate for endoscopic procedure(s) in the ambulatory ([facility]) setting.  The nature of the procedure, as well as the risks, benefits, and alternatives were carefully and thoroughly reviewed with the patient. Ample time for discussion and questions allowed. The patient understood, was satisfied, and agreed to proceed.     HPI: Kelsey Cooke is a 53 y.o. female who presents for EGD for evaluation of GERD, episodic solid food dysphagia, and intermittent RUQ pain.  Currently taking Nexium  40 mg prn only.  Otherwise no significant changes in clinical history since OV on 02/06/2024 with Deanna May, NP.   Past Medical History:  Diagnosis Date   ADD (attention deficit disorder)    Allergic rhinitis    Anxiety    Back pain    Breast tenderness in female 10/09/2015   Capsulitis of shoulder, right    posterior tear   Cutaneous skin tags 03/27/2013   Had 2 minute skin tags on upper abdomen near breast removed   Ear infection 11/13/2015   Elevated BP 11/12/2014   Fatigue 11/12/2014   Fibroid 02/06/2016   Gallbladder problem    GERD (gastroesophageal reflux disease)    Goiter    Headache 01/08/2015   Heartburn    Hemorrhoids 11/12/2014   Hypertension    Joint pain    Menorrhagia 11/08/2013   Missed period 10/09/2015   Obesity    Pelvic pain in female 01/28/2016   Plantar fasciitis    Prediabetes    Rectocele 11/12/2014   Sebaceous cyst of breast    Sinus infection 11/08/2013    Past Surgical History:  Procedure Laterality Date   CHOLECYSTECTOMY N/A 10/17/2020   Procedure: LAPAROSCOPIC CHOLECYSTECTOMY;  Surgeon: Kallie Manuelita BROCKS, MD;  Location: [facility];  Service: General;  Laterality: N/A;   COLONOSCOPY N/A 08/06/2020   Procedure: COLONOSCOPY;  Surgeon: Shaaron Lamar HERO, MD;  Location: [facility];   Service: Endoscopy;  Laterality: N/A;  1:00   goiter     GUM SURGERY     POLYPECTOMY  08/06/2020   Procedure: POLYPECTOMY;  Surgeon: Shaaron Lamar HERO, MD;  Location: [facility];  Service: Endoscopy;;  ileocecal valve   VARICOSE VEIN SURGERY     [facility]      Prior to Admission medications   Medication Sig Start Date End Date Taking? Authorizing Provider  amphetamine -dextroamphetamine (ADDERALL XR) 15 MG 24 hr capsule Take 1 capsule by mouth every morning. 03/29/24   Hoskins, Carolyn C, NP  amphetamine -dextroamphetamine (ADDERALL XR) 15 MG 24 hr capsule Take 1 capsule by mouth every morning. 03/29/24   Hoskins, Carolyn C, NP  amphetamine -dextroamphetamine (ADDERALL XR) 15 MG 24 hr capsule Take 1 capsule by mouth every morning. 03/29/24   Hoskins, Carolyn C, NP  calcium carbonate (TUMS - DOSED IN MG ELEMENTAL CALCIUM) 500 MG chewable tablet Chew 2-3 tablets by mouth daily as needed for indigestion or heartburn. Patient not taking: Reported on 04/19/2024    [provider]  cycloSPORINE  (VEVYE) 0.1 % SOLN Apply to eye.    [provider]  esomeprazole  (NEXIUM ) 40 MG capsule Take 1 capsule (40 mg total) by mouth daily. 05/16/23   Cook, Jayce G, DO  hydrochlorothiazide  (MICROZIDE ) 12.5 MG capsule Take 1 capsule (12.5 mg total) by mouth daily. 05/16/23   Cook, Jayce G, DO  levonorgestrel  (MIRENA )  20 MCG/24HR IUD 1 each by Intrauterine route once.    [provider]  scopolamine  (TRANSDERM-SCOP) 1 MG/3DAYS Place 1 patch (1.5 mg total) onto the skin every 3 (three) days. 09/12/23   Cook, Jayce G, DO  Semaglutide -Weight Management (WEGOVY ) 1 MG/0.5ML SOAJ Inject 1 mg into the skin once a week. 03/15/24   [place] Adelita PENNER, MD  Vitamin D , Ergocalciferol , (DRISDOL ) 1.25 MG (50000 UNIT) CAPS capsule Take 1 capsule (50,000 Units total) by mouth every 7 (seven) days. 03/15/24   [place] Adelita PENNER, MD    Current Outpatient Medications  Medication Sig Dispense Refill    amphetamine -dextroamphetamine (ADDERALL XR) 15 MG 24 hr capsule Take 1 capsule by mouth every morning. 30 capsule 0   amphetamine -dextroamphetamine (ADDERALL XR) 15 MG 24 hr capsule Take 1 capsule by mouth every morning. 30 capsule 0   amphetamine -dextroamphetamine (ADDERALL XR) 15 MG 24 hr capsule Take 1 capsule by mouth every morning. 30 capsule 0   calcium carbonate (TUMS - DOSED IN MG ELEMENTAL CALCIUM) 500 MG chewable tablet Chew 2-3 tablets by mouth daily as needed for indigestion or heartburn. (Patient not taking: Reported on 04/19/2024)     cycloSPORINE  (VEVYE) 0.1 % SOLN Apply to eye.     esomeprazole  (NEXIUM ) 40 MG capsule Take 1 capsule (40 mg total) by mouth daily. 90 capsule 3   hydrochlorothiazide  (MICROZIDE ) 12.5 MG capsule Take 1 capsule (12.5 mg total) by mouth daily. 90 capsule 3   levonorgestrel  (MIRENA ) 20 MCG/24HR IUD 1 each by Intrauterine route once.     scopolamine  (TRANSDERM-SCOP) 1 MG/3DAYS Place 1 patch (1.5 mg total) onto the skin every 3 (three) days. 10 patch 12   Semaglutide -Weight Management (WEGOVY ) 1 MG/0.5ML SOAJ Inject 1 mg into the skin once a week. 2 mL 0   Vitamin D , Ergocalciferol , (DRISDOL ) 1.25 MG (50000 UNIT) CAPS capsule Take 1 capsule (50,000 Units total) by mouth every 7 (seven) days. 12 capsule 0   Current Facility-Administered Medications  Medication Dose Route Frequency Provider Last Rate Last Admin   0.9 %  sodium chloride  infusion  500 mL Intravenous Continuous Shina Wass V, DO        Allergies as of 04/19/2024 - Review Complete 04/19/2024  Allergen Reaction Noted   Cefzil  [cefprozil ] Other (See Comments) 08/28/2015    Family History  Problem Relation Age of Onset   Thyroid  disease Mother    Diabetes Mother    Hypertension Mother    Depression Mother    Anxiety disorder Mother    Irritable bowel syndrome Mother    Hyperlipidemia Father    Hypertension Father    Other Father        weak heart   Irritable bowel syndrome Father     Breast cancer Sister    Hypertension Sister    Thyroid  disease Sister    Hypertension Brother    Hypertension Brother    Breast cancer Maternal Grandmother    Lung cancer Paternal Grandmother    Stomach cancer Paternal Grandmother    Endometriosis Daughter    Asthma Daughter    Birth defects Son    Colon cancer Neg Hx    Rectal cancer Neg Hx     Social History   Socioeconomic History   Marital status: Married    Spouse name: Not on file   Number of children: 3   Years of education: Not on file   Highest education level: 12th grade  Occupational History   Occupation: [employer] with [employer]  Tobacco Use   Smoking status: Former    Current packs/day: 0.00    Types: Cigarettes    Quit date: 1998    Years since quitting: 27.6   Smokeless tobacco: Never  Vaping Use   Vaping status: Never Used  Substance and Sexual Activity   Alcohol use: Yes    Comment: once or twice a year   Drug use: No   Sexual activity: Yes    Birth control/protection: Surgical, I.U.D.    Comment: vasectomy  Other Topics Concern   Not on file  Social History Narrative   Not on file   Social Drivers of Health   Financial Resource Strain: Low Risk  (03/28/2024)   Overall Financial Resource Strain (CARDIA)    Difficulty of Paying Living Expenses: Not hard at all  Food Insecurity: No Food Insecurity (03/28/2024)   Hunger Vital Sign    Worried About Running Out of Food in the Last Year: Never true    Ran Out of Food in the Last Year: Never true  Transportation Needs: No Transportation Needs (03/28/2024)   PRAPARE - Administrator, Civil Service (Medical): No    Lack of Transportation (Non-Medical): No  Physical Activity: Insufficiently Active (03/28/2024)   Exercise Vital Sign    Days of Exercise per Week: 3 days    Minutes of Exercise per Session: 40 min  Stress: No Stress Concern Present (03/28/2024)   [employer] of Occupational Health - Occupational Stress Questionnaire     Feeling of Stress: Only a little  Social Connections: Socially Integrated (03/28/2024)   Social Connection and Isolation Panel    Frequency of Communication with Friends and Family: More than three times a week    Frequency of Social Gatherings with Friends and Family: Twice a week    Attends Religious Services: More than 4 times per year    Active Member of [employer] or Organizations: Yes    Attends Engineer, structural: More than 4 times per year    Marital Status: Married  Catering manager Violence: Not At Risk (02/01/2024)   Humiliation, Afraid, Rape, and Kick questionnaire    Fear of Current or Ex-Partner: No    Emotionally Abused: No    Physically Abused: No    Sexually Abused: No    Physical Exam: Vital signs in last 24 hours: @BP  (!) 138/91   Pulse 66   Temp 98.1 F (36.7 C)   Ht 5' 5 (1.651 m)   Wt 202 lb (91.6 kg)   SpO2 98%   BMI 33.61 kg/m  GEN: NAD EYE: Sclerae anicteric ENT: MMM CV: Non-tachycardic Pulm: CTA b/l GI: Soft, NT/ND NEURO:  Alert & Oriented x 3   Sandor Flatter, DO [facility]   04/19/2024 9:30 AM

## 2024-04-20 ENCOUNTER — Telehealth: Payer: Self-pay

## 2024-04-20 NOTE — Telephone Encounter (Signed)
  Follow up Call-     04/19/2024    8:49 AM  Call back number  Post procedure Call Back phone  # [phone]  Permission to leave phone message Yes     Patient questions:  Do you have a fever, pain , or abdominal swelling? No. Pain Score  0 *  Have you tolerated food without any problems? Yes.    Have you been able to return to your normal activities? Yes.    Do you have any questions about your discharge instructions: Diet   No. Medications  No. Follow up visit  No.  Do you have questions or concerns about your Care? Yes.   Woke up with slight headache.  Encouraged her to increase po fluids. Actions: * If pain score is 4 or above: No action needed, pain <4.

## 2024-04-23 LAB — SURGICAL PATHOLOGY

## 2024-04-24 ENCOUNTER — Ambulatory Visit: Payer: Self-pay | Admitting: Gastroenterology

## 2024-05-03 ENCOUNTER — Other Ambulatory Visit (INDEPENDENT_AMBULATORY_CARE_PROVIDER_SITE_OTHER): Payer: Self-pay | Admitting: Family Medicine

## 2024-05-08 ENCOUNTER — Ambulatory Visit (INDEPENDENT_AMBULATORY_CARE_PROVIDER_SITE_OTHER): Admitting: Family Medicine

## 2024-05-08 VITALS — BP 136/84 | HR 74 | Temp 97.8°F | Ht 65.0 in | Wt 199.0 lb

## 2024-05-08 DIAGNOSIS — E7800 Pure hypercholesterolemia, unspecified: Secondary | ICD-10-CM | POA: Diagnosis not present

## 2024-05-08 DIAGNOSIS — E66812 Obesity, class 2: Secondary | ICD-10-CM

## 2024-05-08 DIAGNOSIS — Z6833 Body mass index (BMI) 33.0-33.9, adult: Secondary | ICD-10-CM

## 2024-05-08 DIAGNOSIS — E559 Vitamin D deficiency, unspecified: Secondary | ICD-10-CM

## 2024-05-08 DIAGNOSIS — R7303 Prediabetes: Secondary | ICD-10-CM | POA: Diagnosis not present

## 2024-05-08 DIAGNOSIS — I10 Essential (primary) hypertension: Secondary | ICD-10-CM

## 2024-05-08 MED ORDER — WEGOVY 1 MG/0.5ML ~~LOC~~ SOAJ: 1.0000 mg | SUBCUTANEOUS | 0 refills | Status: DC

## 2024-05-08 NOTE — Progress Notes (Unsigned)
 SUBJECTIVE:  Chief Complaint: Obesity  Interim History: Patient reports she had to reschedule her last appointment as she and her son were ill.  The rest of her summer was busy- her sister got married and she has been helping her sister get things together.  She has done better on logging and has been around 98%.  She is getting her protein shakes in and at 2 meats a day then eating mostly vegetables and fruit.  She is normally right around calorie goal (sometimes a bit over and sometimes a bit under) and getting all her protein in daily with the help of the protein shakes. Planning to go to the beach at the beginning of October and then is going on a cruise at the end of October.  Kelsey Cooke is here to discuss her progress with her obesity treatment plan. She is on the keeping a food journal and adhering to recommended goals of 1350-1450 calories and 90 grams of protein and states she is following her eating plan approximately 98 % of the time. She states she is walking 8,000-10,000 steps 7 times per week.  She is doing a Jones thing where she has to do a total amount of steps for the month.    OBJECTIVE: Visit Diagnoses: Problem List Items Addressed This Visit   None   Vitals Temp: 97.8 F (36.6 C) BP: 136/84 Pulse Rate: 74 SpO2: 99 %   Anthropometric Measurements Height: 5' 5 (1.651 m) Weight: 199 lb (90.3 kg) BMI (Calculated): 33.12 Weight at Last Visit: 202 lb Weight Lost Since Last Visit: 3 Weight Gained Since Last Visit: 0 Starting Weight: 217 lb   Body Composition  Body Fat %: 42.2 % Fat Mass (lbs): 84 lbs Muscle Mass (lbs): 109.4 lbs Total Body Water  (lbs): 76 lbs Visceral Fat Rating : 11   Other Clinical Data Fasting: yes Labs: yes Today's Visit #: 6 Starting Date: 10/04/23 Comments: 1350-1450/90     ASSESSMENT AND PLAN: Assessment & Plan Essential hypertension Blood pressure well-controlled today.  No chest pain, chest pressure, or headache  reported.  Continue to follow-up blood pressures at next appointments. Prediabetes Last A1c better controlled.  She has been working on dietary modifications and is now using GLP-1 agonist pharmacotherapy for better control of her prediabetes as well as obesity.  Repeating CMP, A1c, and insulin  level today. Vitamin D  deficiency Last vitamin D  level below goal.  She has been on prescription strength vitamin D  since that time.  Will repeat vitamin D  level today to assess response to supplementation. Pure hypercholesterolemia Last fasting lipid panel showing an elevation of LDL cholesterol with an HDL cholesterol below threshold of 50 and triglycerides controlled.  Will repeat fasting lipid panel today with goal to discuss lab results with patient at next appointment BMI 33.0-33.9,adult  Class 2 severe obesity with serious comorbidity and body mass index (BMI) of 36.0 to 36.9 in adult, unspecified obesity type (HCC) Diet, exercise, and medication treatments as below.   Diet: Seraphine is currently in the action stage of change. As such, her goal is to continue with weight loss efforts and has agreed to keeping a food journal and adhering to recommended goals of 1350-1450 calories and 90 or more grams of protein daily.   Exercise:  For substantial health benefits, adults should do at least 150 minutes (2 hours and 30 minutes) a week of moderate-intensity, or 75 minutes (1 hour and 15 minutes) a week of vigorous-intensity aerobic physical activity, or an equivalent combination  of moderate- and vigorous-intensity aerobic activity. Aerobic activity should be performed in episodes of at least 10 minutes, and preferably, it should be spread throughout the week.  Behavior Modification:  We discussed the following Behavioral Modification Strategies today: increasing lean protein intake, decreasing simple carbohydrates, increasing vegetables, meal planning and cooking strategies, and keep a strict food  journal. We discussed various medication options to help Kameryn with her weight loss efforts and we both agreed to stay consistent with 1mg  dose of Wegovy  weekly.  No follow-ups on file.   She was informed of the importance of frequent follow up visits to maximize her success with intensive lifestyle modifications for her multiple health conditions.  Attestation Statements:   Reviewed by clinician on day of visit: allergies, medications, problem list, medical history, surgical history, family history, social history, and previous encounter notes.     Adelita Cho, MD

## 2024-05-10 LAB — LIPID PANEL WITH LDL/HDL RATIO
Cholesterol, Total: 179 mg/dL (ref 100–199)
HDL: 52 mg/dL (ref >39)
LDL Chol Calc (NIH): 110 mg/dL — ABNORMAL HIGH (ref 0–99)
LDL/HDL Ratio: 2.1 ratio (ref 0.0–3.2)
Triglycerides: 90 mg/dL (ref 0–149)
VLDL Cholesterol Cal: 17 mg/dL (ref 5–40)

## 2024-05-10 LAB — COMPREHENSIVE METABOLIC PANEL WITH GFR
ALT: 20 IU/L (ref 0–32)
AST: 19 IU/L (ref 0–40)
Albumin: 4.5 g/dL (ref 3.8–4.9)
Alkaline Phosphatase: 115 IU/L (ref 44–121)
BUN/Creatinine Ratio: 16 (ref 9–23)
BUN: 12 mg/dL (ref 6–24)
Bilirubin Total: 0.8 mg/dL (ref 0.0–1.2)
CO2: 23 mmol/L (ref 20–29)
Calcium: 9 mg/dL (ref 8.7–10.2)
Chloride: 101 mmol/L (ref 96–106)
Creatinine, Ser: 0.77 mg/dL (ref 0.57–1.00)
Globulin, Total: 3 g/dL (ref 1.5–4.5)
Glucose: 84 mg/dL (ref 70–99)
Potassium: 3.9 mmol/L (ref 3.5–5.2)
Sodium: 142 mmol/L (ref 134–144)
Total Protein: 7.5 g/dL (ref 6.0–8.5)
eGFR: 92 mL/min/1.73 (ref >59)

## 2024-05-10 LAB — VITAMIN D 25 HYDROXY (VIT D DEFICIENCY, FRACTURES): Vit D, 25-Hydroxy: 54.8 ng/mL (ref 30.0–100.0)

## 2024-05-10 LAB — HEMOGLOBIN A1C
Est. average glucose Bld gHb Est-mCnc: 105 mg/dL
Hgb A1c MFr Bld: 5.3 % (ref 4.8–5.6)

## 2024-05-10 LAB — INSULIN, RANDOM: INSULIN: 15.9 u[IU]/mL (ref 2.6–24.9)

## 2024-05-10 NOTE — Assessment & Plan Note (Signed)
 Last vitamin D  level below goal.  She has been on prescription strength vitamin D  since that time.  Will repeat vitamin D  level today to assess response to supplementation.

## 2024-05-10 NOTE — Assessment & Plan Note (Signed)
 Last A1c better controlled.  She has been working on dietary modifications and is now using GLP-1 agonist pharmacotherapy for better control of her prediabetes as well as obesity.  Repeating CMP, A1c, and insulin  level today.

## 2024-05-10 NOTE — Assessment & Plan Note (Signed)
 Blood pressure well-controlled today.  No chest pain, chest pressure, or headache reported.  Continue to follow-up blood pressures at next appointments.

## 2024-05-25 ENCOUNTER — Other Ambulatory Visit: Payer: Self-pay | Admitting: Family Medicine

## 2024-05-25 ENCOUNTER — Other Ambulatory Visit (INDEPENDENT_AMBULATORY_CARE_PROVIDER_SITE_OTHER): Payer: Self-pay | Admitting: Family Medicine

## 2024-05-25 DIAGNOSIS — K219 Gastro-esophageal reflux disease without esophagitis: Secondary | ICD-10-CM

## 2024-05-25 DIAGNOSIS — E66812 Obesity, class 2: Secondary | ICD-10-CM

## 2024-05-28 ENCOUNTER — Other Ambulatory Visit: Payer: Self-pay | Admitting: Family Medicine

## 2024-05-28 NOTE — Telephone Encounter (Unsigned)
 Copied from CRM #[id number]. Topic: Clinical - Medication Refill >> May 28, 2024  1:52 PM Fonda T wrote: Medication: amphetamine -dextroamphetamine (ADDERALL XR) 15 MG 24 hr capsule  Has the patient contacted their pharmacy? Yes Advised to contact office  This is the patient's preferred pharmacy:  [facility] [address], [place] - [address] [place] [address] Phone: [phone] Fax: [phone]  Is this the correct pharmacy for this prescription? Yes If no, delete pharmacy and type the correct one.   Has the prescription been filled recently? Yes  Is the patient out of the medication? Yes, patient states took last pill today, needs refilled as soon as possible  Has the patient been seen for an appointment in the last year OR does the patient have an upcoming appointment? Yes  Can we respond through MyChart? Yes  Agent: Please be advised that Rx refills may take up to 3 business days. We ask that you follow-up with your pharmacy.

## 2024-05-29 MED ORDER — AMPHETAMINE-DEXTROAMPHET ER 15 MG PO CP24: 15.0000 mg | ORAL_CAPSULE | ORAL | 0 refills | Status: DC

## 2024-06-18 ENCOUNTER — Ambulatory Visit (INDEPENDENT_AMBULATORY_CARE_PROVIDER_SITE_OTHER): Admitting: Family Medicine

## 2024-06-18 ENCOUNTER — Encounter (INDEPENDENT_AMBULATORY_CARE_PROVIDER_SITE_OTHER): Payer: Self-pay | Admitting: Family Medicine

## 2024-06-18 VITALS — BP 142/88 | HR 79 | Temp 98.6°F | Ht 65.0 in | Wt 196.0 lb

## 2024-06-18 DIAGNOSIS — Z6832 Body mass index (BMI) 32.0-32.9, adult: Secondary | ICD-10-CM

## 2024-06-18 DIAGNOSIS — E559 Vitamin D deficiency, unspecified: Secondary | ICD-10-CM | POA: Diagnosis not present

## 2024-06-18 DIAGNOSIS — E7800 Pure hypercholesterolemia, unspecified: Secondary | ICD-10-CM

## 2024-06-18 DIAGNOSIS — Z6836 Body mass index (BMI) 36.0-36.9, adult: Secondary | ICD-10-CM

## 2024-06-18 DIAGNOSIS — E66812 Obesity, class 2: Secondary | ICD-10-CM | POA: Diagnosis not present

## 2024-06-18 MED ORDER — VITAMIN D (ERGOCALCIFEROL) 1.25 MG (50000 UNIT) PO CAPS: 50000.0000 [IU] | ORAL_CAPSULE | ORAL | 0 refills | Status: DC

## 2024-06-18 MED ORDER — WEGOVY 1.7 MG/0.75ML ~~LOC~~ SOAJ: 1.7000 mg | SUBCUTANEOUS | 0 refills | Status: DC

## 2024-06-18 NOTE — Assessment & Plan Note (Addendum)
 Vitamin d  level of 56 and almost at goal.  Continue vitamin D  every 2 weeks at this time.  Refill sent to pharmacy

## 2024-06-18 NOTE — Progress Notes (Signed)
 SUBJECTIVE:  Chief Complaint: Obesity  Interim History: Patient is feeling relatively well overall. Since our last appointment she had an EGD and had her esophagus stretched.  She has been trying to focus on getting her protein in form of meat and protein shakes daily.  She is also trying to ensure vegetables and fruits in daily.  She is eating dark chocolate daily as a sweet snack.  She is going on a cruise next week- 5 day cruise to [place].  She is anticipating being local for Thanksgiving.  Sometimes she is over her calorie goal by 15 or 20.  She is still feeling hunger even on Wegovy .  She is hitting her protein goal daily.   Kelsey Cooke is here to discuss her progress with her obesity treatment plan. She is on the keeping a food journal and adhering to recommended goals of 1350-1450 calories and 90 grams of protein and states she is following her eating plan approximately 98 % of the time. She states she is walking 2-3 miles 7 times per week.   OBJECTIVE: Visit Diagnoses: Problem List Items Addressed This Visit       Other   Vitamin D  deficiency   Relevant Medications   Vitamin D , Ergocalciferol , (DRISDOL ) 1.25 MG (50000 UNIT) CAPS capsule   Other Visit Diagnoses       Class 2 severe obesity with serious comorbidity and body mass index (BMI) of 36.0 to 36.9 in adult, unspecified obesity type    -  Primary   Relevant Medications   semaglutide -weight management (WEGOVY ) 1.7 MG/0.75ML SOAJ SQ injection     Pure hypercholesterolemia         BMI 32.0-32.9,adult           Vitals Temp: 98.6 F (37 C) BP: (!) 142/88 Pulse Rate: 79 SpO2: 99 %   Anthropometric Measurements Height: 5' 5 (1.651 m) Weight: 196 lb (88.9 kg) BMI (Calculated): 32.62 Weight at Last Visit: 199 lb Weight Lost Since Last Visit: 3 Weight Gained Since Last Visit: 0 Starting Weight: 217 lb Total Weight Loss (lbs): 21 lb (9.526 kg)   Body Composition  Body Fat %: 42.9 % Fat Mass (lbs): 84.4  lbs Muscle Mass (lbs): 106.6 lbs Total Body Water  (lbs): 76.6 lbs Visceral Fat Rating : 11   Other Clinical Data Today's Visit #: 7 Starting Date: 10/04/23 Comments: 1350-1450/90     ASSESSMENT AND PLAN: Assessment & Plan Vitamin D  deficiency Vitamin d  level of 56 and almost at goal.  Continue vitamin D  every 2 weeks at this time.  Refill sent to pharmacy Pure hypercholesterolemia The 10-year ASCVD risk score (Arnett DK, et al., 2019) is: 2.8%   Values used to calculate the score:     Age: 32 years     Clincally relevant sex: Female     Is Non-Hispanic African American: No     Diabetic: No     Tobacco smoker: No     Systolic Blood Pressure: 142 mmHg     Is BP treated: Yes     HDL Cholesterol: 52 mg/dL     Total Cholesterol: 179 mg/dL Discussed continuation of lifestyle modifications which includes dietary changes and physical activity incorporation and consistency.  This will help with LDL control as well as HDL increase. BMI 32.0-32.9,adult  Class 2 severe obesity with serious comorbidity and body mass index (BMI) of 36.0 to 36.9 in adult, unspecified obesity type    Diet: Sequoyah is currently in the action stage of change. As  such, her goal is to continue with weight loss efforts and has agreed to keeping a food journal and adhering to recommended goals of 1350-1450 calories and 90 or more grams protein.   Exercise:  For substantial health benefits, adults should do at least 150 minutes (2 hours and 30 minutes) a week of moderate-intensity, or 75 minutes (1 hour and 15 minutes) a week of vigorous-intensity aerobic physical activity, or an equivalent combination of moderate- and vigorous-intensity aerobic activity. Aerobic activity should be performed in episodes of at least 10 minutes, and preferably, it should be spread throughout the week.  Behavior Modification:  We discussed the following Behavioral Modification Strategies today: increasing lean protein intake,  decreasing simple carbohydrates, increasing vegetables, meal planning and cooking strategies, and travel eating strategies. We discussed various medication options to help Allea with her weight loss efforts and we both agreed to increase Wegovy  to 1.7mg  weekly.  Side effects discussed- patient encourage to monitor for constipation or reflux increase.  Return in about 4 weeks (around 07/16/2024).   She was informed of the importance of frequent follow up visits to maximize her success with intensive lifestyle modifications for her multiple health conditions.  Attestation Statements:   Reviewed by clinician on day of visit: allergies, medications, problem list, medical history, surgical history, family history, social history, and previous encounter notes.   Adelita Cho, MD

## 2024-07-04 ENCOUNTER — Other Ambulatory Visit (INDEPENDENT_AMBULATORY_CARE_PROVIDER_SITE_OTHER): Payer: Self-pay | Admitting: Family Medicine

## 2024-07-04 DIAGNOSIS — E66812 Obesity, class 2: Secondary | ICD-10-CM

## 2024-07-05 ENCOUNTER — Ambulatory Visit: Admitting: Nurse Practitioner

## 2024-07-05 VITALS — BP 144/90 | HR 65 | Temp 97.5°F | Ht 65.0 in | Wt 198.1 lb

## 2024-07-05 DIAGNOSIS — F988 Other specified behavioral and emotional disorders with onset usually occurring in childhood and adolescence: Secondary | ICD-10-CM | POA: Diagnosis not present

## 2024-07-05 DIAGNOSIS — F419 Anxiety disorder, unspecified: Secondary | ICD-10-CM | POA: Diagnosis not present

## 2024-07-05 MED ORDER — AMPHETAMINE-DEXTROAMPHET ER 15 MG PO CP24: 15.0000 mg | ORAL_CAPSULE | ORAL | 0 refills | Status: AC

## 2024-07-05 NOTE — Progress Notes (Unsigned)
 Subjective:    Patient ID: Kelsey Cooke, female    DOB: 14-Jul-1971, 53 y.o.   MRN: [id number]  CC: follow up  HPI 53 year old, female arrived for her 3 month follow up for her ADD medication. She reports that she is taking the medication before work and that it is lasting throughout the work day.   Currently, she is experiencing heightening anxiety due to family issues. Her mom was recently diagnosed with dementia. Has been working on life balance. Does not feel she needs intervention at this time.    Compliant with medication: yes  Difficulty completing tasks or focusing: No  Side effects: No  Appetite: eat small healthy meals  Sleep: sleeping 8 hours nightly  Additional issues or questions: anxiety  Flu and other vaccines deferred. Patient educated on the options.    Review of Systems  Constitutional:  Negative for activity change and appetite change.  Respiratory:  Negative for cough, shortness of breath and wheezing.   Cardiovascular:  Negative for chest pain and palpitations.  Gastrointestinal:  Negative for abdominal pain, constipation, diarrhea, nausea and vomiting.  Neurological:  Negative for light-headedness and headaches.  Psychiatric/Behavioral:  Negative for sleep disturbance. The patient is nervous/anxious.         07/05/2024    9:15 AM 03/29/2024    9:09 AM 02/01/2024    3:38 PM 12/22/2023    9:49 AM  GAD 7 : Generalized Anxiety Score  Nervous, Anxious, on Edge 1 1 0 1  Control/stop worrying 1 0 0 0  Worry too much - different things 1 0 0 0  Trouble relaxing 1 1 1  0  Restless 0 1 1 0  Easily annoyed or irritable 0 0 1 0  Afraid - awful might happen 0 0 0 0  Total GAD 7 Score 4 3 3 1   Anxiety Difficulty Not difficult at all Not difficult at all  Somewhat difficult        07/05/2024    9:14 AM 03/29/2024    9:09 AM 02/01/2024    3:36 PM  Depression screen PHQ 2/9  Decreased Interest 1 1 1   Down, Depressed, Hopeless 0 0 0  PHQ - 2 Score 1 1 1    Altered sleeping  1 1  Tired, decreased energy 1 1 2   Change in appetite 0 0 0  Feeling bad or failure about yourself  0 0 1  Trouble concentrating 1 1 1   Moving slowly or fidgety/restless 1 0 1  Suicidal thoughts 0 0 0  PHQ-9 Score  4 7  Difficult doing work/chores Not difficult at all Not difficult at all     Social History   Tobacco Use   Smoking status: Former    Current packs/day: 0.00    Types: Cigarettes    Quit date: 1998    Years since quitting: 27.8   Smokeless tobacco: Never  Vaping Use   Vaping status: Never Used  Substance Use Topics   Alcohol use: Yes    Comment: once or twice a year   Drug use: No       Objective:   Physical Exam Vitals and nursing note reviewed.  Constitutional:      General: She is not in acute distress.    Appearance: Normal appearance.  Cardiovascular:     Rate and Rhythm: Normal rate and regular rhythm.     Heart sounds: Normal heart sounds.  Pulmonary:     Effort: Pulmonary effort is normal.  Breath sounds: Normal breath sounds.  Neurological:     Mental Status: She is alert and oriented to person, place, and time.  Psychiatric:        Attention and Perception: Attention and perception normal.        Mood and Affect: Mood normal.        Speech: Speech normal.        Behavior: Behavior normal.        Thought Content: Thought content normal.        Cognition and Memory: Cognition and memory normal.        Judgment: Judgment normal.     Vitals:   07/05/24 0913  BP: (!) 144/90  Pulse: 65  Temp: (!) 97.5 F (36.4 C)  Height: 5' 5 (1.651 m)  Weight: 89.9 kg  SpO2: 99%  TempSrc: Temporal  BMI (Calculated): 32.97       Assessment & Plan:  1. Attention deficit disorder (ADD) in adult (Primary) Continue to take medication as prescribed.  - amphetamine -dextroamphetamine (ADDERALL XR) 15 MG 24 hr capsule; Take 1 capsule by mouth every morning.  Dispense: 30 capsule; Refill: 0 - amphetamine -dextroamphetamine  (ADDERALL XR) 15 MG 24 hr capsule; Take 1 capsule by mouth every morning.  Dispense: 30 capsule; Refill: 0 - amphetamine -dextroamphetamine (ADDERALL XR) 15 MG 24 hr capsule; Take 1 capsule by mouth every morning.  Dispense: 30 capsule; Refill: 0  2. Anxiety Patient educated on medicinal and counseling options for anxiety stability. Opts to defer for now. Discussed importance of self care, healthy diet and activity.   Return in about 3 months (around 10/05/2024).   I have seen and examined this patient alongside the NP student. I have reviewed and verified the student note and agree with the assessment and plan.  Elveria Quarry, FNP

## 2024-07-06 ENCOUNTER — Encounter: Payer: Self-pay | Admitting: Nurse Practitioner

## 2024-07-13 ENCOUNTER — Encounter: Payer: Self-pay | Admitting: Gastroenterology

## 2024-07-13 ENCOUNTER — Ambulatory Visit: Admitting: Gastroenterology

## 2024-07-13 VITALS — BP 128/72 | HR 78 | Ht 65.0 in | Wt 197.4 lb

## 2024-07-13 DIAGNOSIS — Z860100 Personal history of colon polyps, unspecified: Secondary | ICD-10-CM | POA: Diagnosis not present

## 2024-07-13 DIAGNOSIS — K222 Esophageal obstruction: Secondary | ICD-10-CM | POA: Diagnosis not present

## 2024-07-13 DIAGNOSIS — K219 Gastro-esophageal reflux disease without esophagitis: Secondary | ICD-10-CM | POA: Diagnosis not present

## 2024-07-13 NOTE — Progress Notes (Signed)
 Chief Complaint:    GERD  GI History: 53 year old female with a history of HTN, anxiety, ADHD, cholecystectomy, goiter removal, follows in the GI clinic for GERD.  He reports a longstanding history of reflux.  Had been taking esomeprazole  40 mg on demand.  Reflux symptoms improved with intentional weight loss after starting Wegovy  in 07/2023.  Also with intermittent solid food dysphagia, worse with bread, French fries, etc.   Family history notable for both mother and father having issues with dysphagia requiring esophageal dilation and colon polyps.  Endoscopic History: - 07/2020: Colonoscopy: 3 semipedunculated polyps in the distal rectum (HP), descending colon (HP), IC valve (adenoma) ranging 4-7 mm, small grade 2 internal hemorrhoids, ascending colon diverticulosis.  Repeat in 5 years - 03/2024: EGD: Benign stricture at 37 cm dilated with 18 mm TTS balloon, otherwise normal esophagus with biopsies negative for EOE.  Normal stomach and duodenum.  Increased Nexium  to 40 mg twice daily x 4 weeks then reduced to 40 mg daily  HPI:     Patient is a 53 y.o. female presenting to the Gastroenterology Clinic for follow-up.   Has been doing well since EGD with dilation.  No dysphagia since then.  Reflux well-controlled and has titrated Nexium  down to as needed only.    Review of systems:     No chest pain, no SOB, no fevers, no urinary sx   Past Medical History:  Diagnosis Date   ADD (attention deficit disorder)    Allergic rhinitis    Anxiety    Back pain    Breast tenderness in female 10/09/2015   Capsulitis of shoulder, right    posterior tear   Cutaneous skin tags 03/27/2013   Had 2 minute skin tags on upper abdomen near breast removed   Ear infection 11/13/2015   Elevated BP 11/12/2014   Fatigue 11/12/2014   Fibroid 02/06/2016   Gallbladder problem    GERD (gastroesophageal reflux disease)    Goiter    Headache 01/08/2015   Heartburn    Hemorrhoids 11/12/2014    Hypertension    Joint pain    Menorrhagia 11/08/2013   Missed period 10/09/2015   Obesity    Pelvic pain in female 01/28/2016   Plantar fasciitis    Prediabetes    Rectocele 11/12/2014   Sebaceous cyst of breast    Sinus infection 11/08/2013    Patient's surgical history, family medical history, social history, medications and allergies were all reviewed in Epic    Current Outpatient Medications  Medication Sig Dispense Refill   amphetamine -dextroamphetamine (ADDERALL XR) 15 MG 24 hr capsule Take 1 capsule by mouth every morning. 30 capsule 0   amphetamine -dextroamphetamine (ADDERALL XR) 15 MG 24 hr capsule Take 1 capsule by mouth every morning. 30 capsule 0   amphetamine -dextroamphetamine (ADDERALL XR) 15 MG 24 hr capsule Take 1 capsule by mouth every morning. 30 capsule 0   calcium carbonate (TUMS - DOSED IN MG ELEMENTAL CALCIUM) 500 MG chewable tablet Chew 2-3 tablets by mouth daily as needed for indigestion or heartburn.     cycloSPORINE  (VEVYE) 0.1 % SOLN Apply to eye.     esomeprazole  (NEXIUM ) 40 MG capsule Take 1 capsule (40 mg total) by mouth 2 (two) times daily before a meal. 30 capsule 1   hydrochlorothiazide  (MICROZIDE ) 12.5 MG capsule Take 1 capsule (12.5 mg total) by mouth daily. 90 capsule 3   levonorgestrel  (MIRENA ) 20 MCG/24HR IUD 1 each by Intrauterine route once.     scopolamine  (TRANSDERM-SCOP) 1  MG/3DAYS Place 1 patch (1.5 mg total) onto the skin every 3 (three) days. 10 patch 12   semaglutide -weight management (WEGOVY ) 1.7 MG/0.75ML SOAJ SQ injection Inject 1.7 mg into the skin once a week. 3 mL 0   Vitamin D , Ergocalciferol , (DRISDOL ) 1.25 MG (50000 UNIT) CAPS capsule Take 1 capsule (50,000 Units total) by mouth every 14 (fourteen) days. 6 capsule 0   No current facility-administered medications for this visit.    Physical Exam:     Ht 5' 5 (1.651 m)   Wt 197 lb 6 oz (89.5 kg)   BMI 32.84 kg/m   GENERAL:  Pleasant female in NAD PSYCH: : Cooperative,  normal affect Musculoskeletal:  Normal muscle tone, normal strength NEURO: Alert and oriented x 3, no focal neurologic deficits   IMPRESSION and PLAN:    1) GERD 2) Peptic stricture Good response to EGD with dilation along with course of high-dose PPI therapy.  Has essentially had resolution of reflux symptoms and now just using Nexium  on demand only. - Continue antireflux lifestyle/dietary modification - Continue Nexium  on demand.  If return of reflux symptoms, will resume daily dosing  3) History of colon polyps - Due for repeat colonoscopy in 07/2025  RTC prn           Sandor GAILS Valory Wetherby ,DO, FACG 07/13/2024, 8:22 AM

## 2024-07-13 NOTE — Patient Instructions (Signed)
 Please follow up as needed.  Thank you for entrusting me with your care and for choosing [facility], Dr. Queen Flatter  _______________________________________________________  If your blood pressure at your visit was 140/90 or greater, please contact your primary care physician to follow up on this.  _______________________________________________________  If you are age 54 or older, your body mass index should be between 23-30. Your Body mass index is 32.84 kg/m. If this is out of the aforementioned range listed, please consider follow up with your Primary Care Provider.  If you are age 60 or younger, your body mass index should be between 19-25. Your Body mass index is 32.84 kg/m. If this is out of the aformentioned range listed, please consider follow up with your Primary Care Provider.   ________________________________________________________  The [facility] providers would like to encourage you to use MYCHART to communicate with providers for non-urgent requests or questions.  Due to long hold times on the telephone, sending your provider a message by [facility] may be a faster and more efficient way to get a response.  Please allow 48 business hours for a response.  Please remember that this is for non-urgent requests.  _______________________________________________________  [facility] is using a team-based approach to care.  Your team is made up of your doctor and two to three APPS. Our APPS (Nurse Practitioners and Physician Assistants) work with your physician to ensure care continuity for you. They are fully qualified to address your health concerns and develop a treatment plan. They communicate directly with your gastroenterologist to care for you. Seeing the Advanced Practice Practitioners on your physician's team can help you by facilitating care more promptly, often allowing for earlier appointments, access to diagnostic testing, procedures, and other  specialty referrals.

## 2024-07-14 ENCOUNTER — Other Ambulatory Visit (INDEPENDENT_AMBULATORY_CARE_PROVIDER_SITE_OTHER): Payer: Self-pay | Admitting: Family Medicine

## 2024-07-14 DIAGNOSIS — E66812 Obesity, class 2: Secondary | ICD-10-CM

## 2024-07-17 ENCOUNTER — Telehealth (INDEPENDENT_AMBULATORY_CARE_PROVIDER_SITE_OTHER): Payer: Self-pay | Admitting: Family Medicine

## 2024-07-17 NOTE — Telephone Encounter (Signed)
 Patient called stating that she is out of Wegovy . Pt needs a refill asap. She had her last dose on yesterday Monday 07/16/2024.

## 2024-07-18 ENCOUNTER — Ambulatory Visit (INDEPENDENT_AMBULATORY_CARE_PROVIDER_SITE_OTHER): Payer: Self-pay | Admitting: Family Medicine

## 2024-07-18 ENCOUNTER — Encounter (INDEPENDENT_AMBULATORY_CARE_PROVIDER_SITE_OTHER): Payer: Self-pay | Admitting: Family Medicine

## 2024-07-18 VITALS — BP 151/86 | HR 75 | Temp 98.5°F | Ht 65.0 in | Wt 193.0 lb

## 2024-07-18 DIAGNOSIS — E559 Vitamin D deficiency, unspecified: Secondary | ICD-10-CM

## 2024-07-18 DIAGNOSIS — E66812 Obesity, class 2: Secondary | ICD-10-CM

## 2024-07-18 DIAGNOSIS — Z6832 Body mass index (BMI) 32.0-32.9, adult: Secondary | ICD-10-CM | POA: Diagnosis not present

## 2024-07-18 DIAGNOSIS — Z6836 Body mass index (BMI) 36.0-36.9, adult: Secondary | ICD-10-CM

## 2024-07-18 MED ORDER — VITAMIN D (ERGOCALCIFEROL) 1.25 MG (50000 UNIT) PO CAPS: 50000.0000 [IU] | ORAL_CAPSULE | ORAL | 0 refills | Status: DC

## 2024-07-18 MED ORDER — WEGOVY 1.7 MG/0.75ML ~~LOC~~ SOAJ: 1.7000 mg | SUBCUTANEOUS | 0 refills | Status: DC

## 2024-07-18 NOTE — Progress Notes (Signed)
 SUBJECTIVE:  Chief Complaint: Obesity  Interim History: Patient went on celebrity cruise and to a wedding since our last appointment. She has been doing very well with food logging.  She has been staying consistent with her food intake and nutrition and has been mass preparing food to ensure she is getting her daily intake. 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  She is cooking most of the food but not doing much.  She isn't feeling hungry much because she is keeping herself busy.  Kelsey Cooke is here to discuss her progress with her obesity treatment plan. She is on the keeping a food journal and adhering to recommended goals of 1350-1450 calories and 90 grams of protein and states she is following her eating plan approximately 95 % of the time. She states she is not exercising.   OBJECTIVE: Visit Diagnoses: Problem List Items Addressed This Visit   None Visit Diagnoses       Class 2 severe obesity with serious comorbidity and body mass index (BMI) of 36.0 to 36.9 in adult, unspecified obesity type       Relevant Medications   semaglutide -weight management (WEGOVY ) 1.7 MG/0.75ML SOAJ SQ injection       Vitals Temp: 98.5 F (36.9 C) BP: (!) 151/86 Pulse Rate: 75 SpO2: 100 %   Anthropometric Measurements Height: 5' 5 (1.651 m) Weight: 193 lb (87.5 kg) BMI (Calculated): 32.12 Weight at Last Visit: 196 lb Weight Lost Since Last Visit: 3 Weight Gained Since Last Visit: 0 Starting Weight: 217 lb Total Weight Loss (lbs): 24 lb (10.9 kg)   Body Composition  Body Fat %: 41.8 % Fat Mass (lbs): 81 lbs Muscle Mass (lbs): 106.8 lbs Total Body Water  (lbs): 73.8 lbs Visceral Fat Rating : 10   Other Clinical Data Today's Visit #: 8 Starting Date: 10/04/23 Comments: 1350-1450/90     ASSESSMENT AND PLAN: Assessment & Plan Vitamin D  deficiency Tolerating prescription strength vitamin D  and needs refill today.  No nausea, vomiting, muscle weakness reported. BMI  32.0-32.9,adult  Class 2 severe obesity with serious comorbidity and body mass index (BMI) of 36.0 to 36.9 in adult, unspecified obesity type    Diet: Kelsey Cooke is currently in the action stage of change. As such, her goal is to continue with weight loss efforts and has agreed to keeping a food journal and adhering to recommended goals of 1350-1450 calories and 90 or more grams of protein daily.   Exercise:  For substantial health benefits, adults should do at least 150 minutes (2 hours and 30 minutes) a week of moderate-intensity, or 75 minutes (1 hour and 15 minutes) a week of vigorous-intensity aerobic physical activity, or an equivalent combination of moderate- and vigorous-intensity aerobic activity. Aerobic activity should be performed in episodes of at least 10 minutes, and preferably, it should be spread throughout the week.  Behavior Modification:  We discussed the following Behavioral Modification Strategies today: increasing lean protein intake, decreasing simple carbohydrates, increasing vegetables, meal planning and cooking strategies, keeping healthy foods in the home, holiday eating strategies, planning for success, and keep a strict food journal. We discussed various medication options to help Kelsey Cooke with her weight loss efforts and we both agreed to continue wegovy  at current dose at this time- having good control without side effects.  Return in about 4 weeks (around 08/15/2024).   She was informed of the importance of frequent follow up visits to maximize her success with intensive lifestyle modifications for her multiple health conditions.  Attestation Statements:  Reviewed by clinician on day of visit: allergies, medications, problem list, medical history, surgical history, family history, social history, and previous encounter notes.     Kelsey Cho, MD

## 2024-07-24 ENCOUNTER — Ambulatory Visit: Admitting: Gastroenterology

## 2024-07-26 NOTE — Assessment & Plan Note (Signed)
 Tolerating prescription strength vitamin D  and needs refill today.  No nausea, vomiting, muscle weakness reported.

## 2024-08-02 ENCOUNTER — Other Ambulatory Visit (INDEPENDENT_AMBULATORY_CARE_PROVIDER_SITE_OTHER): Payer: Self-pay | Admitting: Family Medicine

## 2024-08-02 DIAGNOSIS — E66812 Obesity, class 2: Secondary | ICD-10-CM

## 2024-08-14 ENCOUNTER — Ambulatory Visit (INDEPENDENT_AMBULATORY_CARE_PROVIDER_SITE_OTHER): Admitting: Family Medicine

## 2024-08-14 VITALS — BP 142/90 | HR 74 | Temp 98.9°F | Ht 65.0 in | Wt 194.0 lb

## 2024-08-14 DIAGNOSIS — Z6832 Body mass index (BMI) 32.0-32.9, adult: Secondary | ICD-10-CM

## 2024-08-14 DIAGNOSIS — E66812 Obesity, class 2: Secondary | ICD-10-CM | POA: Diagnosis not present

## 2024-08-14 DIAGNOSIS — I10 Essential (primary) hypertension: Secondary | ICD-10-CM

## 2024-08-14 DIAGNOSIS — E7800 Pure hypercholesterolemia, unspecified: Secondary | ICD-10-CM

## 2024-08-14 DIAGNOSIS — Z6836 Body mass index (BMI) 36.0-36.9, adult: Secondary | ICD-10-CM

## 2024-08-14 MED ORDER — WEGOVY 1.7 MG/0.75ML ~~LOC~~ SOAJ: 1.7000 mg | SUBCUTANEOUS | 0 refills | Status: DC

## 2024-08-14 NOTE — Progress Notes (Signed)
 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  She knows she has been eating more and thinks this is related to seasonal availability of food that she doesn't normally have accessible to her. She has two more get togethers, and then a Christmas Eve gathering for finger foods.  She often has a breakfast Christmas Day for anyone that wants to come.  No plans for January to go anywhere.  She has logged, inconsistently.  She may be a bit under her protein goal as well as her calorie goal.    Kelsey Cooke is here to discuss her progress with her obesity treatment plan. She is on the keeping a food journal and adhering to recommended goals of 1350-1450 calories and 90 grams of protein and states she is following her eating plan approximately 80-85 % of the time. She states she is walking a lot.  OBJECTIVE: Visit Diagnoses: Problem List Items Addressed This Visit       Cardiovascular and Mediastinum   Essential hypertension - Primary   Other Visit Diagnoses       Pure hypercholesterolemia         BMI 32.0-32.9,adult         Class 2 severe obesity with serious comorbidity and body mass index (BMI) of 36.0 to 36.9 in adult, unspecified obesity type       Relevant Medications   semaglutide -weight management (WEGOVY ) 1.7 MG/0.75ML SOAJ SQ injection       Vitals Temp: 98.9 F (37.2 C) BP: (!) 142/90 Pulse Rate: 74 SpO2: 99 %   Anthropometric Measurements Height: 5' 5 (1.651 m) Weight: 194 lb (88 kg) BMI (Calculated): 32.28 Weight at Last Visit: 193 lb Weight Lost Since Last Visit: 0 Weight Gained Since Last Visit: 1 Starting Weight: 217 lb Total Weight Loss (lbs): 23 lb (10.4 kg)   Body Composition  Body Fat %: 42.2 % Fat Mass (lbs): 81.8 lbs Muscle Mass (lbs): 106.6 lbs Total Body Water  (lbs): 74.8 lbs Visceral Fat Rating : 11   Other Clinical Data Today's Visit #: 9 Starting Date:  10/04/23 Comments: 1350-1450/90     ASSESSMENT AND PLAN: Assessment & Plan Essential hypertension Blood pressure slightly elevated today.  Patient does not report any chest pain, chest pressure, or headache.  Patient was encouraged to check blood pressure at home and bring blood pressure cuff to compare with our blood pressure cuff at next appointment.  If blood pressure remains elevated will increase medication at that time. Pure hypercholesterolemia Patient is not on any medication for cholesterol management at this time.  Continuing to work on lifestyle modifications to manage her cholesterol.  No change in treatment plan at this time. BMI 32.0-32.9,adult  Class 2 severe obesity with serious comorbidity and body mass index (BMI) of 36.0 to 36.9 in adult, unspecified obesity type    Diet: Kelsey Cooke is currently in the action stage of change. As such, her goal is to continue with weight loss efforts and has agreed to keeping a food journal and adhering to recommended goals of 1350-1450 calories and 95 or more grams protein daily.   Exercise:  For substantial health benefits, adults should do at least 150 minutes (2 hours and 30 minutes) a week of moderate-intensity, or 75 minutes (1 hour and 15 minutes) a week of vigorous-intensity aerobic physical activity, or an equivalent combination of moderate- and vigorous-intensity aerobic activity. Aerobic activity should be performed in episodes of at  least 10 minutes, and preferably, it should be spread throughout the week.  Behavior Modification:  We discussed the following Behavioral Modification Strategies today: increasing lean protein intake, decreasing simple carbohydrates, meal planning and cooking strategies, holiday eating strategies, and planning for success. We discussed various medication options to help Kelsey Cooke with her weight loss efforts and we both agreed to continue Wegovy  at current dose of 1.7mg  weekly.  Return in about 8  weeks (around 10/09/2024).   She was informed of the importance of frequent follow up visits to maximize her success with intensive lifestyle modifications for her multiple health conditions.  Attestation Statements:   Reviewed by clinician on day of visit: allergies, medications, problem list, medical history, surgical history, family history, social history, and previous encounter notes.     Adelita Cho, MD "

## 2024-08-27 ENCOUNTER — Other Ambulatory Visit: Payer: Self-pay | Admitting: Family Medicine

## 2024-08-27 ENCOUNTER — Other Ambulatory Visit: Payer: Self-pay | Admitting: Gastroenterology

## 2024-08-27 DIAGNOSIS — K219 Gastro-esophageal reflux disease without esophagitis: Secondary | ICD-10-CM

## 2024-08-27 NOTE — Assessment & Plan Note (Signed)
 Blood pressure slightly elevated today.  Patient does not report any chest pain, chest pressure, or headache.  Patient was encouraged to check blood pressure at home and bring blood pressure cuff to compare with our blood pressure cuff at next appointment.  If blood pressure remains elevated will increase medication at that time.

## 2024-09-04 ENCOUNTER — Telehealth: Payer: Self-pay | Admitting: Pharmacy Technician

## 2024-09-04 ENCOUNTER — Other Ambulatory Visit (HOSPITAL_COMMUNITY): Payer: Self-pay

## 2024-09-04 NOTE — Telephone Encounter (Signed)
 Pharmacy Patient Advocate Encounter   Received notification from [employer] that prior authorization for Amphetamine -Dextroamphet ER 15MG  capsules is required/requested.   Insurance verification completed.   The patient is insured through [employer].   Per test claim: PA required; PA submitted to above mentioned insurance via Latent Key/confirmation #/EOC [id number] Status is pending

## 2024-09-06 ENCOUNTER — Other Ambulatory Visit (HOSPITAL_COMMUNITY): Payer: Self-pay

## 2024-09-06 NOTE — Telephone Encounter (Signed)
 Pharmacy Patient Advocate Encounter  Received notification from [employer] that Prior Authorization for Amphetamine -Dextroamphet ER 15MG  has been APPROVED from 09/04/24 to 09/04/25 filled yesterday   PA #/Case ID/Reference #: [id number]

## 2024-09-12 ENCOUNTER — Encounter (INDEPENDENT_AMBULATORY_CARE_PROVIDER_SITE_OTHER): Payer: Self-pay | Admitting: Family Medicine

## 2024-09-12 ENCOUNTER — Ambulatory Visit (INDEPENDENT_AMBULATORY_CARE_PROVIDER_SITE_OTHER): Admitting: Family Medicine

## 2024-09-12 VITALS — BP 126/87 | HR 64 | Temp 97.9°F | Ht 65.0 in | Wt 191.0 lb

## 2024-09-12 DIAGNOSIS — E66812 Obesity, class 2: Secondary | ICD-10-CM | POA: Diagnosis not present

## 2024-09-12 DIAGNOSIS — Z6836 Body mass index (BMI) 36.0-36.9, adult: Secondary | ICD-10-CM

## 2024-09-12 DIAGNOSIS — I10 Essential (primary) hypertension: Secondary | ICD-10-CM

## 2024-09-12 DIAGNOSIS — Z6833 Body mass index (BMI) 33.0-33.9, adult: Secondary | ICD-10-CM

## 2024-09-12 DIAGNOSIS — E559 Vitamin D deficiency, unspecified: Secondary | ICD-10-CM

## 2024-09-12 MED ORDER — WEGOVY 1.7 MG/0.75ML ~~LOC~~ SOAJ: 1.7000 mg | SUBCUTANEOUS | 0 refills | Status: DC

## 2024-09-12 MED ORDER — VITAMIN D (ERGOCALCIFEROL) 1.25 MG (50000 UNIT) PO CAPS: 50000.0000 [IU] | ORAL_CAPSULE | ORAL | 0 refills | Status: AC

## 2024-09-12 NOTE — Progress Notes (Signed)
 "  Kelsey Cooke, D.O.  ABFM, ABOM Specializing in Clinical Bariatric Medicine  Office located at: [address] [place], [place]  [address]       Medications Discontinued During This Encounter  Medication Reason   Vitamin D , Ergocalciferol , (DRISDOL ) 1.25 MG (50000 UNIT) CAPS capsule Reorder   semaglutide -weight management (WEGOVY ) 1.7 MG/0.75ML SOAJ SQ injection Reorder     Meds ordered this encounter  Medications   semaglutide -weight management (WEGOVY ) 1.7 MG/0.75ML SOAJ SQ injection    Sig: Inject 1.7 mg into the skin once a week.    Dispense:  9 mL    Refill:  0   Vitamin D , Ergocalciferol , (DRISDOL ) 1.25 MG (50000 UNIT) CAPS capsule    Sig: Take 1 capsule (50,000 Units total) by mouth every 14 (fourteen) days.    Dispense:  6 capsule    Refill:  0      A) FOR THE CHRONIC DISEASE OF OBESITY:  BMI 33.0-33.9,adult-Current BMI 31.78 Class 2 severe obesity with (BMI) of 36.0 to 36.9 in adult, Start BMI 36.11  Chief complaint: Obesity [name] is here to discuss her progress with her obesity treatment plan.   History of present illness / Interval history:  Kelsey Cooke is here today for her follow-up office visit.  Since last OV on 08/14/2024, pt is down 3 lbs.   Pt reports she has been doing more exercise since LOV. Generally hits her caloric intake but struggles to get in her protein intake.    08/14/24 09/12/24   Body Fat % 42.2 % 43 %  Muscle Mass (lbs) 106.6 lbs 103.4 lbs  Fat Mass (lbs) 81.8 lbs 82.4 lbs  Total Body Water  (lbs) 74.8 lbs 74.6 lbs  Visceral Fat Rating  11 11  Counseling done on how various foods will affect these numbers and how to maximize success   Total lbs lost to date: -26 lbs Total Fat Mass in lbs lost to date: -22.6 Total weight loss percentage to date: -11.98 %   Nutrition Therapy She is keeping a food journal and adhering to recommended goals of 1350-1450 calories and 95 or more grams protein daily and states she is  following her eating plan approximately 85 % of the time.   - Tracking Calories/Macros: yes  - Eating More Whole Foods: yes  - Adequate Protein Intake: no  - Adequate Water  Intake: yes  - Skipping Meals: yes  - Sleeping 7-9 Hours/ Night: yes   [name] is currently in the action stage of change. As such, her goal is to continue weight management plan.  She has agreed to: continue current plan   Physical Activity Pt is doing the shaking machine or stationary bike 20  minutes 5-7 days per week   Yasmen has been advised to work up to 300-450 minutes of moderate intensity aerobic activity a week and strengthening exercises 2-3 times per week for cardiovascular health, weight loss maintenance and preservation of muscle mass.  She has agreed to : Think about enjoyable ways to increase daily physical activity and overcoming barriers to exercise, Increase physical activity in their day and reduce sedentary time (increase NEAT)., Increase the intensity, frequency or duration of aerobic exercises  , and Combine aerobic and strengthening exercises for efficiency and improved cardiometabolic health.   Behavioral Modifications Evidence-based interventions for health behavior change were utilized today including the discussion of  1) self monitoring techniques:  journaling 2) problem-solving barriers:  Joint pain; look into aqua therapy 3) self care:  exercise 4) SMART goals for next OV:  none Regarding patient's less desirable eating habits and patterns, we employed the technique of small changes.   We discussed the following today: increasing lean protein intake to established goals, decreasing simple carbohydrates , continue to work on implementation of reduced calorie nutritional plan, continue to practice mindfulness when eating, and focusing on food with a 10:1 ratio of calories: grams of protein Additional resources provided today: Handout on Common Characteristics of Successful  Weight Losers and Maintainers    Medical Interventions/ Pharmacotherapy Previous Bariatric surgery: none Pharmacotherapy for weight loss: She is currently taking Wegovy  1.7 mg once weekly for medical weight loss.    Doing well on the Wegovy  1.7 mg once weekly. Hunger and cravings are well controlled. No acute concerns today. Cont medication(refill today).   We discussed various medication options to help Mayetta with her weight loss efforts and we both agreed to : Continue with current nutritional and behavioral strategies and Cont Wegovy  at same dose.    B) OBESITY RELATED CONDITIONS ADDRESSED TODAY:   Essential hypertension Assessment & Plan Last 3 blood pressure readings in our office are as follows: BP Readings from Last 3 Encounters:  09/12/24 126/87  08/14/24 (!) 142/90  07/18/24 (!) 151/86   The 10-year ASCVD risk score (Arnett DK, et al., 2019) is: 2.2%  Lab Results  Component Value Date   CREATININE 0.77 05/08/2024  Currently on hydrochlorothiazide  12.5 mg once daily with good compliance and tolerance. BP is well controlled today. No acute concerns. Cont adherence to medication. Cont low-sodium, heart-healthy diet. Increase exercise as able.     Vitamin D  deficiency Assessment & Plan Lab Results  Component Value Date   VD25OH 54.8 05/08/2024   VD25OH 33.1 10/04/2023  Currently on Ergo 50K once every 2 weeks with good compliance and tolerance. Vit D levels are at goal. Cont regimen(refill today). Recheck levels as necessary.      Medications Discontinued During This Encounter  Medication Reason   Vitamin D , Ergocalciferol , (DRISDOL ) 1.25 MG (50000 UNIT) CAPS capsule Reorder   semaglutide -weight management (WEGOVY ) 1.7 MG/0.75ML SOAJ SQ injection Reorder     Meds ordered this encounter  Medications   semaglutide -weight management (WEGOVY ) 1.7 MG/0.75ML SOAJ SQ injection    Sig: Inject 1.7 mg into the skin once a week.    Dispense:  9 mL    Refill:  0    Vitamin D , Ergocalciferol , (DRISDOL ) 1.25 MG (50000 UNIT) CAPS capsule    Sig: Take 1 capsule (50,000 Units total) by mouth every 14 (fourteen) days.    Dispense:  6 capsule    Refill:  0      Follow up:   Return 10/02/2024 4:00 PM [place] Adelita PENNER, MD.  She was informed of the importance of frequent follow up visits to maximize her success with intensive lifestyle modifications for her multiple health conditions.   Weight Summary and Biometrics   Weight Lost Since Last Visit: 3lb  Weight Gained Since Last Visit: 0lb    Vitals Temp: 97.9 F (36.6 C) BP: 126/87 Pulse Rate: 64 SpO2: 97 %   Anthropometric Measurements Height: 5' 5 (1.651 m) Weight: 191 lb (86.6 kg) BMI (Calculated): 31.78 Weight at Last Visit: 194lb Weight Lost Since Last Visit: 3lb Weight Gained Since Last Visit: 0lb Starting Weight: 217lb Total Weight Loss (lbs): 26 lb (11.8 kg)   Body Composition  Body Fat %: 43 % Fat Mass (lbs): 82.4 lbs Muscle Mass (lbs): 103.4 lbs Total Body  Water  (lbs): 74.6 lbs Visceral Fat Rating : 11   Other Clinical Data Fasting: no Labs: no Today's Visit #: 10 Starting Date: 10/04/23    Objective:   PHYSICAL EXAM: Blood pressure 126/87, pulse 64, temperature 97.9 F (36.6 C), height 5' 5 (1.651 m), weight 191 lb (86.6 kg), SpO2 97%. Body mass index is 31.78 kg/m.  General: she is overweight, cooperative and in no acute distress. PSYCH: Has normal mood, affect and thought process.   HEENT: EOMI, sclerae are anicteric. Lungs: Normal breathing effort, no conversational dyspnea. Extremities: Moves * 4 Neurologic: A and O * 3, good insight  DIAGNOSTIC DATA REVIEWED: BMET    Component Value Date/Time   NA 142 05/08/2024 0846   K 3.9 05/08/2024 0846   CL 101 05/08/2024 0846   CO2 23 05/08/2024 0846   GLUCOSE 84 05/08/2024 0846   GLUCOSE 95 10/16/2020 1152   BUN 12 05/08/2024 0846   CREATININE 0.77 05/08/2024 0846   CREATININE 0.52 11/08/2013 0950    CALCIUM 9.0 05/08/2024 0846   GFRNONAA >60 10/16/2020 1152   GFRAA 109 03/08/2018 0953   Lab Results  Component Value Date   HGBA1C 5.3 05/08/2024   HGBA1C 5.7 (H) 07/21/2022   Lab Results  Component Value Date   INSULIN  15.9 05/08/2024   INSULIN  14.7 10/04/2023   Lab Results  Component Value Date   TSH 1.670 10/04/2023   CBC    Component Value Date/Time   WBC 6.0 10/04/2023 0916   WBC 6.6 08/30/2020 1034   RBC 5.77 (H) 10/04/2023 0916   RBC 5.70 (H) 08/30/2020 1034   HGB 14.4 10/04/2023 0916   HCT 45.8 10/04/2023 0916   PLT 274 10/04/2023 0916   MCV 79 10/04/2023 0916   MCH 25.0 (L) 10/04/2023 0916   MCH 24.6 (L) 08/30/2020 1034   MCHC 31.4 (L) 10/04/2023 0916   MCHC 29.8 (L) 08/30/2020 1034   RDW 14.2 10/04/2023 0916   Iron Studies No results found for: IRON, TIBC, FERRITIN, IRONPCTSAT Lipid Panel     Component Value Date/Time   CHOL 179 05/08/2024 0846   TRIG 90 05/08/2024 0846   HDL 52 05/08/2024 0846   CHOLHDL 3.4 07/21/2022 0835   CHOLHDL 2.8 11/08/2013 0950   VLDL 17 11/08/2013 0950   LDLCALC 110 (H) 05/08/2024 0846   Hepatic Function Panel     Component Value Date/Time   PROT 7.5 05/08/2024 0846   ALBUMIN 4.5 05/08/2024 0846   AST 19 05/08/2024 0846   ALT 20 05/08/2024 0846   ALKPHOS 115 05/08/2024 0846   BILITOT 0.8 05/08/2024 0846      Component Value Date/Time   TSH 1.670 10/04/2023 0916   Nutritional Lab Results  Component Value Date   VD25OH 54.8 05/08/2024   VD25OH 33.1 10/04/2023    Attestations:   I, Feliciano Mingle, acting as a stage manager for [employer], DO., have compiled all relevant documentation for today's office visit on behalf of Kelsey Jenkins, DO, while in the presence of [employer], DO.   I have reviewed the above documentation for accuracy and completeness, and I agree with the above. Kelsey Cooke, D.O.  The 21st Century Cures Act was signed into law in 2016 which includes the topic of  electronic health records.  This provides immediate access to information in MyChart.  This includes consultation notes, operative notes, office notes, lab results and pathology reports.  If you have any questions about what you read please let us  know at  your next visit so we can discuss your concerns and take corrective action if need be.  We are right here with you.  "

## 2024-10-02 ENCOUNTER — Ambulatory Visit (INDEPENDENT_AMBULATORY_CARE_PROVIDER_SITE_OTHER): Admitting: Family Medicine

## 2024-10-02 ENCOUNTER — Encounter (INDEPENDENT_AMBULATORY_CARE_PROVIDER_SITE_OTHER): Payer: Self-pay | Admitting: Family Medicine

## 2024-10-02 VITALS — BP 154/96 | HR 69 | Temp 98.3°F | Ht 65.0 in | Wt 193.0 lb

## 2024-10-02 DIAGNOSIS — Z6836 Body mass index (BMI) 36.0-36.9, adult: Secondary | ICD-10-CM

## 2024-10-02 DIAGNOSIS — I10 Essential (primary) hypertension: Secondary | ICD-10-CM

## 2024-10-02 DIAGNOSIS — Z6832 Body mass index (BMI) 32.0-32.9, adult: Secondary | ICD-10-CM

## 2024-10-02 MED ORDER — WEGOVY 2.4 MG/0.75ML ~~LOC~~ SOAJ: 2.4000 mg | SUBCUTANEOUS | 0 refills | Status: AC

## 2024-10-02 NOTE — Assessment & Plan Note (Signed)
 Blood pressure has been elevated over the last 3 months.  Patient stopped hydrochlorothiazide  previously.  Will restart hydrochlorothiazide  at current dose.

## 2024-10-11 ENCOUNTER — Ambulatory Visit: Admitting: Nurse Practitioner

## 2024-10-16 ENCOUNTER — Ambulatory Visit (INDEPENDENT_AMBULATORY_CARE_PROVIDER_SITE_OTHER): Admitting: Family Medicine

## 2024-11-07 ENCOUNTER — Ambulatory Visit (INDEPENDENT_AMBULATORY_CARE_PROVIDER_SITE_OTHER): Admitting: Family Medicine
